# Patient Record
Sex: Female | Born: 1940
Health system: Southern US, Community
[De-identification: ages and names within clinical notes are randomized; demographics above are authoritative.]

## PROBLEM LIST (undated history)

## (undated) DIAGNOSIS — R112 Nausea with vomiting, unspecified: Secondary | ICD-10-CM

## (undated) DIAGNOSIS — T8859XA Other complications of anesthesia, initial encounter: Secondary | ICD-10-CM

## (undated) DIAGNOSIS — K589 Irritable bowel syndrome without diarrhea: Secondary | ICD-10-CM

## (undated) DIAGNOSIS — Z923 Personal history of irradiation: Secondary | ICD-10-CM

## (undated) DIAGNOSIS — I10 Essential (primary) hypertension: Secondary | ICD-10-CM

## (undated) DIAGNOSIS — C801 Malignant (primary) neoplasm, unspecified: Secondary | ICD-10-CM

## (undated) DIAGNOSIS — T4145XA Adverse effect of unspecified anesthetic, initial encounter: Secondary | ICD-10-CM

## (undated) DIAGNOSIS — Z889 Allergy status to unspecified drugs, medicaments and biological substances status: Secondary | ICD-10-CM

## (undated) DIAGNOSIS — Z803 Family history of malignant neoplasm of breast: Secondary | ICD-10-CM

## (undated) DIAGNOSIS — M1990 Unspecified osteoarthritis, unspecified site: Secondary | ICD-10-CM

## (undated) DIAGNOSIS — Z800 Family history of malignant neoplasm of digestive organs: Secondary | ICD-10-CM

## (undated) DIAGNOSIS — Z9889 Other specified postprocedural states: Secondary | ICD-10-CM

## (undated) DIAGNOSIS — C50919 Malignant neoplasm of unspecified site of unspecified female breast: Secondary | ICD-10-CM

## (undated) DIAGNOSIS — H409 Unspecified glaucoma: Secondary | ICD-10-CM

## (undated) HISTORY — DX: Family history of malignant neoplasm of breast: Z80.3

## (undated) HISTORY — DX: Family history of malignant neoplasm of digestive organs: Z80.0

## (undated) HISTORY — PX: BREAST BIOPSY: SHX20

---

## 2005-02-08 ENCOUNTER — Emergency Department (HOSPITAL_COMMUNITY): Admission: EM | Admit: 2005-02-08 | Discharge: 2005-02-09 | Payer: Self-pay | Admitting: Emergency Medicine

## 2005-03-21 ENCOUNTER — Ambulatory Visit (HOSPITAL_COMMUNITY): Admission: RE | Admit: 2005-03-21 | Discharge: 2005-03-21 | Payer: Self-pay | Admitting: Family Medicine

## 2005-07-18 ENCOUNTER — Other Ambulatory Visit: Admission: RE | Admit: 2005-07-18 | Discharge: 2005-07-18 | Payer: Self-pay | Admitting: Family Medicine

## 2006-04-25 ENCOUNTER — Ambulatory Visit (HOSPITAL_COMMUNITY): Admission: RE | Admit: 2006-04-25 | Discharge: 2006-04-25 | Payer: Self-pay | Admitting: Family Medicine

## 2007-04-28 ENCOUNTER — Ambulatory Visit (HOSPITAL_COMMUNITY): Admission: RE | Admit: 2007-04-28 | Discharge: 2007-04-28 | Payer: Self-pay | Admitting: Family Medicine

## 2008-06-22 ENCOUNTER — Ambulatory Visit (HOSPITAL_COMMUNITY): Admission: RE | Admit: 2008-06-22 | Discharge: 2008-06-22 | Payer: Self-pay | Admitting: Family Medicine

## 2008-10-03 ENCOUNTER — Ambulatory Visit (HOSPITAL_BASED_OUTPATIENT_CLINIC_OR_DEPARTMENT_OTHER): Admission: RE | Admit: 2008-10-03 | Discharge: 2008-10-03 | Payer: Self-pay | Admitting: Family Medicine

## 2008-10-03 ENCOUNTER — Ambulatory Visit: Payer: Self-pay | Admitting: Interventional Radiology

## 2008-10-14 ENCOUNTER — Encounter: Admission: RE | Admit: 2008-10-14 | Discharge: 2008-10-14 | Payer: Self-pay | Admitting: Family Medicine

## 2008-10-14 DIAGNOSIS — R509 Fever, unspecified: Secondary | ICD-10-CM | POA: Insufficient documentation

## 2008-10-14 DIAGNOSIS — R7989 Other specified abnormal findings of blood chemistry: Secondary | ICD-10-CM | POA: Insufficient documentation

## 2008-10-18 ENCOUNTER — Encounter: Payer: Self-pay | Admitting: Infectious Diseases

## 2008-10-19 ENCOUNTER — Encounter: Payer: Self-pay | Admitting: Infectious Diseases

## 2008-10-21 DIAGNOSIS — J309 Allergic rhinitis, unspecified: Secondary | ICD-10-CM | POA: Insufficient documentation

## 2008-10-21 DIAGNOSIS — K5730 Diverticulosis of large intestine without perforation or abscess without bleeding: Secondary | ICD-10-CM | POA: Insufficient documentation

## 2008-10-21 DIAGNOSIS — E669 Obesity, unspecified: Secondary | ICD-10-CM | POA: Insufficient documentation

## 2008-10-21 DIAGNOSIS — I10 Essential (primary) hypertension: Secondary | ICD-10-CM | POA: Insufficient documentation

## 2008-10-21 DIAGNOSIS — E785 Hyperlipidemia, unspecified: Secondary | ICD-10-CM | POA: Insufficient documentation

## 2008-10-27 ENCOUNTER — Ambulatory Visit: Payer: Self-pay | Admitting: Infectious Diseases

## 2008-10-28 ENCOUNTER — Encounter: Payer: Self-pay | Admitting: Infectious Diseases

## 2008-11-10 ENCOUNTER — Ambulatory Visit: Payer: Self-pay | Admitting: Infectious Diseases

## 2009-03-01 ENCOUNTER — Ambulatory Visit (HOSPITAL_BASED_OUTPATIENT_CLINIC_OR_DEPARTMENT_OTHER): Admission: RE | Admit: 2009-03-01 | Discharge: 2009-03-01 | Payer: Self-pay | Admitting: Family Medicine

## 2009-03-01 ENCOUNTER — Ambulatory Visit: Payer: Self-pay | Admitting: Diagnostic Radiology

## 2010-04-10 ENCOUNTER — Ambulatory Visit (HOSPITAL_COMMUNITY): Admission: RE | Admit: 2010-04-10 | Discharge: 2010-04-10 | Payer: Self-pay | Admitting: Family Medicine

## 2010-10-14 ENCOUNTER — Encounter: Payer: Self-pay | Admitting: Family Medicine

## 2011-05-03 ENCOUNTER — Other Ambulatory Visit (HOSPITAL_COMMUNITY): Payer: Self-pay | Admitting: Family Medicine

## 2011-05-03 DIAGNOSIS — Z1231 Encounter for screening mammogram for malignant neoplasm of breast: Secondary | ICD-10-CM

## 2011-05-14 ENCOUNTER — Ambulatory Visit (HOSPITAL_COMMUNITY)
Admission: RE | Admit: 2011-05-14 | Discharge: 2011-05-14 | Disposition: A | Discharge status: Home / Self Care | Payer: Medicare Other | Source: Ambulatory Visit | Attending: Family Medicine | Admitting: Family Medicine

## 2011-05-14 DIAGNOSIS — Z1231 Encounter for screening mammogram for malignant neoplasm of breast: Secondary | ICD-10-CM | POA: Insufficient documentation

## 2011-09-26 DIAGNOSIS — J309 Allergic rhinitis, unspecified: Secondary | ICD-10-CM | POA: Diagnosis not present

## 2011-10-03 DIAGNOSIS — J309 Allergic rhinitis, unspecified: Secondary | ICD-10-CM | POA: Diagnosis not present

## 2011-10-07 DIAGNOSIS — J309 Allergic rhinitis, unspecified: Secondary | ICD-10-CM | POA: Diagnosis not present

## 2011-10-10 DIAGNOSIS — J309 Allergic rhinitis, unspecified: Secondary | ICD-10-CM | POA: Diagnosis not present

## 2011-10-17 DIAGNOSIS — J309 Allergic rhinitis, unspecified: Secondary | ICD-10-CM | POA: Diagnosis not present

## 2011-10-24 DIAGNOSIS — J309 Allergic rhinitis, unspecified: Secondary | ICD-10-CM | POA: Diagnosis not present

## 2011-10-29 DIAGNOSIS — J309 Allergic rhinitis, unspecified: Secondary | ICD-10-CM | POA: Diagnosis not present

## 2011-11-04 DIAGNOSIS — J309 Allergic rhinitis, unspecified: Secondary | ICD-10-CM | POA: Diagnosis not present

## 2011-11-11 DIAGNOSIS — J309 Allergic rhinitis, unspecified: Secondary | ICD-10-CM | POA: Diagnosis not present

## 2011-11-18 DIAGNOSIS — J309 Allergic rhinitis, unspecified: Secondary | ICD-10-CM | POA: Diagnosis not present

## 2011-11-25 DIAGNOSIS — J309 Allergic rhinitis, unspecified: Secondary | ICD-10-CM | POA: Diagnosis not present

## 2011-11-29 DIAGNOSIS — E7800 Pure hypercholesterolemia, unspecified: Secondary | ICD-10-CM | POA: Diagnosis not present

## 2011-11-29 DIAGNOSIS — Z1331 Encounter for screening for depression: Secondary | ICD-10-CM | POA: Diagnosis not present

## 2011-11-29 DIAGNOSIS — I10 Essential (primary) hypertension: Secondary | ICD-10-CM | POA: Diagnosis not present

## 2011-12-02 DIAGNOSIS — J309 Allergic rhinitis, unspecified: Secondary | ICD-10-CM | POA: Diagnosis not present

## 2011-12-04 DIAGNOSIS — J309 Allergic rhinitis, unspecified: Secondary | ICD-10-CM | POA: Diagnosis not present

## 2011-12-09 DIAGNOSIS — J309 Allergic rhinitis, unspecified: Secondary | ICD-10-CM | POA: Diagnosis not present

## 2011-12-10 DIAGNOSIS — J309 Allergic rhinitis, unspecified: Secondary | ICD-10-CM | POA: Diagnosis not present

## 2011-12-11 DIAGNOSIS — H2510 Age-related nuclear cataract, unspecified eye: Secondary | ICD-10-CM | POA: Diagnosis not present

## 2011-12-11 DIAGNOSIS — H524 Presbyopia: Secondary | ICD-10-CM | POA: Diagnosis not present

## 2011-12-11 DIAGNOSIS — H5210 Myopia, unspecified eye: Secondary | ICD-10-CM | POA: Diagnosis not present

## 2011-12-16 DIAGNOSIS — J309 Allergic rhinitis, unspecified: Secondary | ICD-10-CM | POA: Diagnosis not present

## 2011-12-23 DIAGNOSIS — J309 Allergic rhinitis, unspecified: Secondary | ICD-10-CM | POA: Diagnosis not present

## 2011-12-30 DIAGNOSIS — J301 Allergic rhinitis due to pollen: Secondary | ICD-10-CM | POA: Diagnosis not present

## 2011-12-30 DIAGNOSIS — R21 Rash and other nonspecific skin eruption: Secondary | ICD-10-CM | POA: Diagnosis not present

## 2011-12-30 DIAGNOSIS — J309 Allergic rhinitis, unspecified: Secondary | ICD-10-CM | POA: Diagnosis not present

## 2011-12-30 DIAGNOSIS — H1045 Other chronic allergic conjunctivitis: Secondary | ICD-10-CM | POA: Diagnosis not present

## 2011-12-30 DIAGNOSIS — J3089 Other allergic rhinitis: Secondary | ICD-10-CM | POA: Diagnosis not present

## 2012-01-07 DIAGNOSIS — J309 Allergic rhinitis, unspecified: Secondary | ICD-10-CM | POA: Diagnosis not present

## 2012-01-13 DIAGNOSIS — J309 Allergic rhinitis, unspecified: Secondary | ICD-10-CM | POA: Diagnosis not present

## 2012-01-16 DIAGNOSIS — J309 Allergic rhinitis, unspecified: Secondary | ICD-10-CM | POA: Diagnosis not present

## 2012-01-21 DIAGNOSIS — J309 Allergic rhinitis, unspecified: Secondary | ICD-10-CM | POA: Diagnosis not present

## 2012-01-27 DIAGNOSIS — J309 Allergic rhinitis, unspecified: Secondary | ICD-10-CM | POA: Diagnosis not present

## 2012-02-03 DIAGNOSIS — J309 Allergic rhinitis, unspecified: Secondary | ICD-10-CM | POA: Diagnosis not present

## 2012-02-10 DIAGNOSIS — J309 Allergic rhinitis, unspecified: Secondary | ICD-10-CM | POA: Diagnosis not present

## 2012-02-18 DIAGNOSIS — J309 Allergic rhinitis, unspecified: Secondary | ICD-10-CM | POA: Diagnosis not present

## 2012-02-25 DIAGNOSIS — J309 Allergic rhinitis, unspecified: Secondary | ICD-10-CM | POA: Diagnosis not present

## 2012-03-04 DIAGNOSIS — R21 Rash and other nonspecific skin eruption: Secondary | ICD-10-CM | POA: Diagnosis not present

## 2012-03-04 DIAGNOSIS — J3089 Other allergic rhinitis: Secondary | ICD-10-CM | POA: Diagnosis not present

## 2012-03-04 DIAGNOSIS — H1045 Other chronic allergic conjunctivitis: Secondary | ICD-10-CM | POA: Diagnosis not present

## 2012-03-04 DIAGNOSIS — J301 Allergic rhinitis due to pollen: Secondary | ICD-10-CM | POA: Diagnosis not present

## 2012-03-25 DIAGNOSIS — J309 Allergic rhinitis, unspecified: Secondary | ICD-10-CM | POA: Diagnosis not present

## 2012-04-23 DIAGNOSIS — J309 Allergic rhinitis, unspecified: Secondary | ICD-10-CM | POA: Diagnosis not present

## 2012-04-30 DIAGNOSIS — J309 Allergic rhinitis, unspecified: Secondary | ICD-10-CM | POA: Diagnosis not present

## 2012-05-04 ENCOUNTER — Other Ambulatory Visit (HOSPITAL_COMMUNITY): Payer: Self-pay | Admitting: Family Medicine

## 2012-05-04 DIAGNOSIS — Z1231 Encounter for screening mammogram for malignant neoplasm of breast: Secondary | ICD-10-CM

## 2012-05-07 DIAGNOSIS — J309 Allergic rhinitis, unspecified: Secondary | ICD-10-CM | POA: Diagnosis not present

## 2012-05-15 DIAGNOSIS — J309 Allergic rhinitis, unspecified: Secondary | ICD-10-CM | POA: Diagnosis not present

## 2012-05-21 ENCOUNTER — Ambulatory Visit (HOSPITAL_COMMUNITY)
Admission: RE | Admit: 2012-05-21 | Discharge: 2012-05-21 | Disposition: A | Discharge status: Home / Self Care | Payer: Medicare Other | Source: Ambulatory Visit | Attending: Family Medicine | Admitting: Family Medicine

## 2012-05-21 DIAGNOSIS — Z1231 Encounter for screening mammogram for malignant neoplasm of breast: Secondary | ICD-10-CM | POA: Diagnosis not present

## 2012-05-22 DIAGNOSIS — J309 Allergic rhinitis, unspecified: Secondary | ICD-10-CM | POA: Diagnosis not present

## 2012-05-29 DIAGNOSIS — J309 Allergic rhinitis, unspecified: Secondary | ICD-10-CM | POA: Diagnosis not present

## 2012-06-02 DIAGNOSIS — Z23 Encounter for immunization: Secondary | ICD-10-CM | POA: Diagnosis not present

## 2012-06-09 DIAGNOSIS — J309 Allergic rhinitis, unspecified: Secondary | ICD-10-CM | POA: Diagnosis not present

## 2012-08-16 DIAGNOSIS — N390 Urinary tract infection, site not specified: Secondary | ICD-10-CM | POA: Diagnosis not present

## 2012-11-30 DIAGNOSIS — I10 Essential (primary) hypertension: Secondary | ICD-10-CM | POA: Diagnosis not present

## 2012-11-30 DIAGNOSIS — E7800 Pure hypercholesterolemia, unspecified: Secondary | ICD-10-CM | POA: Diagnosis not present

## 2012-11-30 DIAGNOSIS — J3089 Other allergic rhinitis: Secondary | ICD-10-CM | POA: Diagnosis not present

## 2012-12-22 ENCOUNTER — Other Ambulatory Visit (HOSPITAL_COMMUNITY)
Admission: RE | Admit: 2012-12-22 | Discharge: 2012-12-22 | Disposition: A | Discharge status: Home / Self Care | Payer: Medicare Other | Source: Ambulatory Visit | Attending: Obstetrics and Gynecology | Admitting: Obstetrics and Gynecology

## 2012-12-22 ENCOUNTER — Other Ambulatory Visit: Payer: Self-pay | Admitting: Obstetrics and Gynecology

## 2012-12-22 DIAGNOSIS — Z01419 Encounter for gynecological examination (general) (routine) without abnormal findings: Secondary | ICD-10-CM | POA: Insufficient documentation

## 2012-12-22 DIAGNOSIS — R1031 Right lower quadrant pain: Secondary | ICD-10-CM | POA: Diagnosis not present

## 2012-12-22 DIAGNOSIS — N951 Menopausal and female climacteric states: Secondary | ICD-10-CM | POA: Diagnosis not present

## 2012-12-22 DIAGNOSIS — Z1151 Encounter for screening for human papillomavirus (HPV): Secondary | ICD-10-CM | POA: Diagnosis not present

## 2013-01-28 DIAGNOSIS — R1031 Right lower quadrant pain: Secondary | ICD-10-CM | POA: Diagnosis not present

## 2013-01-28 DIAGNOSIS — N951 Menopausal and female climacteric states: Secondary | ICD-10-CM | POA: Diagnosis not present

## 2013-02-12 DIAGNOSIS — H5210 Myopia, unspecified eye: Secondary | ICD-10-CM | POA: Diagnosis not present

## 2013-02-12 DIAGNOSIS — H11829 Conjunctivochalasis, unspecified eye: Secondary | ICD-10-CM | POA: Diagnosis not present

## 2013-02-12 DIAGNOSIS — H40009 Preglaucoma, unspecified, unspecified eye: Secondary | ICD-10-CM | POA: Diagnosis not present

## 2013-02-12 DIAGNOSIS — H02839 Dermatochalasis of unspecified eye, unspecified eyelid: Secondary | ICD-10-CM | POA: Insufficient documentation

## 2013-02-12 DIAGNOSIS — H40003 Preglaucoma, unspecified, bilateral: Secondary | ICD-10-CM | POA: Insufficient documentation

## 2013-02-12 DIAGNOSIS — H2589 Other age-related cataract: Secondary | ICD-10-CM | POA: Diagnosis not present

## 2013-02-16 DIAGNOSIS — R9389 Abnormal findings on diagnostic imaging of other specified body structures: Secondary | ICD-10-CM | POA: Diagnosis not present

## 2013-02-16 DIAGNOSIS — R1031 Right lower quadrant pain: Secondary | ICD-10-CM | POA: Diagnosis not present

## 2013-05-20 DIAGNOSIS — R9389 Abnormal findings on diagnostic imaging of other specified body structures: Secondary | ICD-10-CM | POA: Diagnosis not present

## 2013-05-28 ENCOUNTER — Other Ambulatory Visit (HOSPITAL_COMMUNITY): Payer: Self-pay | Admitting: Family Medicine

## 2013-05-28 DIAGNOSIS — Z1231 Encounter for screening mammogram for malignant neoplasm of breast: Secondary | ICD-10-CM

## 2013-05-31 DIAGNOSIS — M659 Synovitis and tenosynovitis, unspecified: Secondary | ICD-10-CM | POA: Diagnosis not present

## 2013-05-31 DIAGNOSIS — R7309 Other abnormal glucose: Secondary | ICD-10-CM | POA: Diagnosis not present

## 2013-05-31 DIAGNOSIS — I10 Essential (primary) hypertension: Secondary | ICD-10-CM | POA: Diagnosis not present

## 2013-06-04 ENCOUNTER — Ambulatory Visit (HOSPITAL_COMMUNITY)
Admission: RE | Admit: 2013-06-04 | Discharge: 2013-06-04 | Disposition: A | Discharge status: Home / Self Care | Payer: Medicare Other | Source: Ambulatory Visit | Attending: Family Medicine | Admitting: Family Medicine

## 2013-06-04 DIAGNOSIS — Z1231 Encounter for screening mammogram for malignant neoplasm of breast: Secondary | ICD-10-CM | POA: Insufficient documentation

## 2013-06-07 DIAGNOSIS — Z23 Encounter for immunization: Secondary | ICD-10-CM | POA: Diagnosis not present

## 2013-11-29 DIAGNOSIS — I10 Essential (primary) hypertension: Secondary | ICD-10-CM | POA: Diagnosis not present

## 2013-11-29 DIAGNOSIS — E7800 Pure hypercholesterolemia, unspecified: Secondary | ICD-10-CM | POA: Diagnosis not present

## 2013-11-29 DIAGNOSIS — B372 Candidiasis of skin and nail: Secondary | ICD-10-CM | POA: Diagnosis not present

## 2014-01-03 DIAGNOSIS — Z803 Family history of malignant neoplasm of breast: Secondary | ICD-10-CM | POA: Diagnosis not present

## 2014-01-03 DIAGNOSIS — Z01419 Encounter for gynecological examination (general) (routine) without abnormal findings: Secondary | ICD-10-CM | POA: Diagnosis not present

## 2014-01-04 ENCOUNTER — Telehealth: Payer: Self-pay | Admitting: Genetic Counselor

## 2014-01-04 NOTE — Telephone Encounter (Signed)
S/W PATIENT AND GAVE GENETIC APPT FOR 05/13 @ 1:30 REFERRING DR. Estill Bamberg VARNDO DX- FAMILY HX OF BREAST CA

## 2014-02-02 ENCOUNTER — Other Ambulatory Visit: Payer: Medicare Other

## 2014-02-02 ENCOUNTER — Encounter: Payer: Self-pay | Admitting: Genetic Counselor

## 2014-02-02 ENCOUNTER — Ambulatory Visit (HOSPITAL_BASED_OUTPATIENT_CLINIC_OR_DEPARTMENT_OTHER): Payer: Medicare Other | Admitting: Genetic Counselor

## 2014-02-02 DIAGNOSIS — Z807 Family history of other malignant neoplasms of lymphoid, hematopoietic and related tissues: Secondary | ICD-10-CM

## 2014-02-02 DIAGNOSIS — Z803 Family history of malignant neoplasm of breast: Secondary | ICD-10-CM | POA: Diagnosis not present

## 2014-02-02 DIAGNOSIS — Z800 Family history of malignant neoplasm of digestive organs: Secondary | ICD-10-CM | POA: Diagnosis not present

## 2014-02-02 DIAGNOSIS — Z809 Family history of malignant neoplasm, unspecified: Secondary | ICD-10-CM | POA: Diagnosis not present

## 2014-02-02 DIAGNOSIS — Z801 Family history of malignant neoplasm of trachea, bronchus and lung: Secondary | ICD-10-CM | POA: Diagnosis not present

## 2014-02-02 NOTE — Progress Notes (Signed)
Patient Name: Cheyenne Lee Patient Age: 73 y.o. Encounter Date: 02/02/2014  Referring Physician: Campbell Riches, MD [address]  [place], [place] [address]  Primary Care Provider: Bobby Rumpf, MD   [name], a 73 y.o. female, is being seen at the [facility] due to a family history of breast cancer.  She presents to clinic today to discuss the possibility of a hereditary predisposition to cancer and discuss whether genetic testing is warranted.  HISTORY OF PRESENT ILLNESS: Cheyenne Lee has no personal history of cancer. She reports having fibrocystic breasts and previous removal of benign fibroadenomas. She reports having a yearly mammogram, clinical breast exam and gynecologic exam. Her last colonoscopy was in 2008 and she states 2-3 polyps were removed. A prior colonoscopy in 1998 revealed 2 polyps. She states she is scheduled for a colonoscopy this June.  Past Medical History  Diagnosis Date  . Family history of malignant neoplasm of breast    History   Social History  . Marital Status: Married    Spouse Name: N/A    Number of Children: N/A  . Years of Education: N/A   Social History Main Topics  . Smoking status: Not on file  . Smokeless tobacco: Not on file  . Alcohol Use: Not on file  . Drug Use: Not on file  . Sexual Activity: Not on file   Other Topics Concern  . Not on file   Social History Narrative  . No narrative on file     FAMILY HISTORY:   During the visit, a 4-generation pedigree was obtained. Significant diagnoses include the following:  Family History  Problem Relation Age of Onset  . Cancer Father 47    unk. primary; deceased 44  . Breast cancer Sister 36    currently 74; history of NHL  . Lung cancer Maternal Aunt     deceased 72; smoker  . Lung cancer Maternal Uncle     2 uncles; smokers; deceased  . Breast cancer Paternal Aunt 11    deceased 59s  . Cancer Paternal Uncle      unk. primary; deceased late 38s  . Breast cancer Paternal Grandmother     deceased 48  . Lung cancer Paternal Grandfather     deceased 9; smoker  . Breast cancer Paternal Aunt 24    deceased 72s  . Breast cancer Cousin     pat cousin; daughter of an aunt w/ BC; dx 57s; deceased 8  . Cancer Maternal Aunt     unk. primary; deceased 75s  . Cancer Maternal Uncle     GI cancer; deceased 62s  . Cancer Other 35    lymphoma in niece; daughter of her brother; deceased    All breast cancers reported above occurred post menopause. Her mother died cancer-free at age 66. Cheyenne Lee reports many cancers, but age at diagnosis and site of primary is unknown to her for several relatives.  Cheyenne Lee ancestry is Caucasian - NOS. There is no known Jewish ancestry and no consanguinity.  ASSESSMENT AND PLAN: Cheyenne Lee is a 73 y.o. female with a family history of breast cancer in her sister, 2 paternal aunts, paternal grandmother and a paternal cousin. Even though there are several relatives with breast cancer, this history is not suggestive of a hereditary predisposition to cancer given the ages at diagnosis. We reviewed the characteristics, features and inheritance patterns of hereditary cancer syndromes to explain why her  own risk of having a hereditary risk is low.  Cheyenne Lee stated that her sister with breast cancer is scheduled to have genetic testing. If a pathogenic mutation is identified in her sister, we would recommend testing Cheyenne Lee for that mutation. Otherwise, genetic testing is not indicated at this time.  Genetic testing was not recommend at this time. We recommended Cheyenne Lee continue to have a yearly mammogram, a yearly clinical breast exam, perform monthly breast self-exams and have a yearly gynecologic exam. She is scheduled for a colonoscopy in June of this year.  We encouraged Cheyenne Lee to remain in contact with [facility] annually so that we can update  the family history and inform her of any changes in cancer genetics and testing that may be of benefit for this family. She agreed to contact us with results of her sister's genetic testing. [name] questions were answered to her satisfaction today.   Thank you for the referral and allowing [place] to share in the care of your patient.   The patient was seen for a total of 30 minutes, greater than 50% of which was spent face-to-face counseling. This patient was discussed with the referring provider who agrees with the above.

## 2014-02-08 DIAGNOSIS — M79609 Pain in unspecified limb: Secondary | ICD-10-CM | POA: Diagnosis not present

## 2014-02-24 ENCOUNTER — Ambulatory Visit: Payer: Self-pay | Admitting: Podiatry

## 2014-03-09 ENCOUNTER — Other Ambulatory Visit: Payer: Self-pay | Admitting: Gastroenterology

## 2014-03-09 DIAGNOSIS — Z86010 Personal history of colonic polyps: Secondary | ICD-10-CM | POA: Diagnosis not present

## 2014-03-09 DIAGNOSIS — Z09 Encounter for follow-up examination after completed treatment for conditions other than malignant neoplasm: Secondary | ICD-10-CM | POA: Diagnosis not present

## 2014-03-09 DIAGNOSIS — K5730 Diverticulosis of large intestine without perforation or abscess without bleeding: Secondary | ICD-10-CM | POA: Diagnosis not present

## 2014-03-09 DIAGNOSIS — D126 Benign neoplasm of colon, unspecified: Secondary | ICD-10-CM | POA: Diagnosis not present

## 2014-05-03 DIAGNOSIS — M79609 Pain in unspecified limb: Secondary | ICD-10-CM | POA: Diagnosis not present

## 2014-06-02 ENCOUNTER — Other Ambulatory Visit (HOSPITAL_COMMUNITY): Payer: Self-pay | Admitting: Family Medicine

## 2014-06-02 DIAGNOSIS — Z23 Encounter for immunization: Secondary | ICD-10-CM | POA: Diagnosis not present

## 2014-06-02 DIAGNOSIS — Z1231 Encounter for screening mammogram for malignant neoplasm of breast: Secondary | ICD-10-CM

## 2014-06-02 DIAGNOSIS — I10 Essential (primary) hypertension: Secondary | ICD-10-CM | POA: Diagnosis not present

## 2014-06-02 DIAGNOSIS — Z0000 Encounter for general adult medical examination without abnormal findings: Secondary | ICD-10-CM | POA: Diagnosis not present

## 2014-06-07 ENCOUNTER — Ambulatory Visit (HOSPITAL_COMMUNITY)
Admission: RE | Admit: 2014-06-07 | Discharge: 2014-06-07 | Disposition: A | Discharge status: Home / Self Care | Payer: Medicare Other | Source: Ambulatory Visit | Attending: Family Medicine | Admitting: Family Medicine

## 2014-06-07 DIAGNOSIS — Z1231 Encounter for screening mammogram for malignant neoplasm of breast: Secondary | ICD-10-CM | POA: Insufficient documentation

## 2014-06-10 DIAGNOSIS — Z23 Encounter for immunization: Secondary | ICD-10-CM | POA: Diagnosis not present

## 2014-07-19 DIAGNOSIS — B351 Tinea unguium: Secondary | ICD-10-CM | POA: Diagnosis not present

## 2014-08-02 DIAGNOSIS — J069 Acute upper respiratory infection, unspecified: Secondary | ICD-10-CM | POA: Diagnosis not present

## 2014-08-02 DIAGNOSIS — M25512 Pain in left shoulder: Secondary | ICD-10-CM | POA: Diagnosis not present

## 2014-08-02 DIAGNOSIS — I10 Essential (primary) hypertension: Secondary | ICD-10-CM | POA: Diagnosis not present

## 2014-08-02 DIAGNOSIS — L509 Urticaria, unspecified: Secondary | ICD-10-CM | POA: Diagnosis not present

## 2014-08-06 DIAGNOSIS — L309 Dermatitis, unspecified: Secondary | ICD-10-CM | POA: Diagnosis not present

## 2014-12-01 DIAGNOSIS — I10 Essential (primary) hypertension: Secondary | ICD-10-CM | POA: Diagnosis not present

## 2014-12-01 DIAGNOSIS — M791 Myalgia: Secondary | ICD-10-CM | POA: Diagnosis not present

## 2014-12-01 DIAGNOSIS — J302 Other seasonal allergic rhinitis: Secondary | ICD-10-CM | POA: Diagnosis not present

## 2014-12-01 DIAGNOSIS — E669 Obesity, unspecified: Secondary | ICD-10-CM | POA: Diagnosis not present

## 2014-12-01 DIAGNOSIS — E780 Pure hypercholesterolemia: Secondary | ICD-10-CM | POA: Diagnosis not present

## 2014-12-14 DIAGNOSIS — M25512 Pain in left shoulder: Secondary | ICD-10-CM | POA: Diagnosis not present

## 2014-12-17 DIAGNOSIS — M25512 Pain in left shoulder: Secondary | ICD-10-CM | POA: Diagnosis not present

## 2014-12-20 DIAGNOSIS — M25512 Pain in left shoulder: Secondary | ICD-10-CM | POA: Diagnosis not present

## 2014-12-22 DIAGNOSIS — M25512 Pain in left shoulder: Secondary | ICD-10-CM | POA: Diagnosis not present

## 2014-12-26 DIAGNOSIS — M25512 Pain in left shoulder: Secondary | ICD-10-CM | POA: Diagnosis not present

## 2014-12-28 DIAGNOSIS — M25512 Pain in left shoulder: Secondary | ICD-10-CM | POA: Diagnosis not present

## 2015-01-02 DIAGNOSIS — M25512 Pain in left shoulder: Secondary | ICD-10-CM | POA: Diagnosis not present

## 2015-01-05 DIAGNOSIS — H43393 Other vitreous opacities, bilateral: Secondary | ICD-10-CM | POA: Insufficient documentation

## 2015-01-06 DIAGNOSIS — H43393 Other vitreous opacities, bilateral: Secondary | ICD-10-CM | POA: Diagnosis not present

## 2015-01-06 DIAGNOSIS — H25813 Combined forms of age-related cataract, bilateral: Secondary | ICD-10-CM | POA: Diagnosis not present

## 2015-01-06 DIAGNOSIS — H40003 Preglaucoma, unspecified, bilateral: Secondary | ICD-10-CM | POA: Diagnosis not present

## 2015-01-06 DIAGNOSIS — H52203 Unspecified astigmatism, bilateral: Secondary | ICD-10-CM | POA: Diagnosis not present

## 2015-01-06 DIAGNOSIS — H5213 Myopia, bilateral: Secondary | ICD-10-CM | POA: Diagnosis not present

## 2015-01-06 DIAGNOSIS — H11823 Conjunctivochalasis, bilateral: Secondary | ICD-10-CM | POA: Diagnosis not present

## 2015-01-31 DIAGNOSIS — Z87891 Personal history of nicotine dependence: Secondary | ICD-10-CM | POA: Diagnosis not present

## 2015-01-31 DIAGNOSIS — H25812 Combined forms of age-related cataract, left eye: Secondary | ICD-10-CM | POA: Diagnosis not present

## 2015-01-31 DIAGNOSIS — Z885 Allergy status to narcotic agent status: Secondary | ICD-10-CM | POA: Diagnosis not present

## 2015-01-31 DIAGNOSIS — Z7982 Long term (current) use of aspirin: Secondary | ICD-10-CM | POA: Diagnosis not present

## 2015-01-31 DIAGNOSIS — I10 Essential (primary) hypertension: Secondary | ICD-10-CM | POA: Diagnosis not present

## 2015-02-06 DIAGNOSIS — H52209 Unspecified astigmatism, unspecified eye: Secondary | ICD-10-CM | POA: Diagnosis not present

## 2015-02-06 DIAGNOSIS — H40003 Preglaucoma, unspecified, bilateral: Secondary | ICD-10-CM | POA: Diagnosis not present

## 2015-02-06 DIAGNOSIS — I10 Essential (primary) hypertension: Secondary | ICD-10-CM | POA: Diagnosis not present

## 2015-02-06 DIAGNOSIS — Z885 Allergy status to narcotic agent status: Secondary | ICD-10-CM | POA: Diagnosis not present

## 2015-02-06 DIAGNOSIS — H25812 Combined forms of age-related cataract, left eye: Secondary | ICD-10-CM | POA: Diagnosis not present

## 2015-02-06 DIAGNOSIS — Z7951 Long term (current) use of inhaled steroids: Secondary | ICD-10-CM | POA: Diagnosis not present

## 2015-02-06 DIAGNOSIS — K219 Gastro-esophageal reflux disease without esophagitis: Secondary | ICD-10-CM | POA: Diagnosis not present

## 2015-02-06 DIAGNOSIS — M1990 Unspecified osteoarthritis, unspecified site: Secondary | ICD-10-CM | POA: Diagnosis not present

## 2015-02-06 DIAGNOSIS — Z7982 Long term (current) use of aspirin: Secondary | ICD-10-CM | POA: Diagnosis not present

## 2015-02-06 DIAGNOSIS — Z7902 Long term (current) use of antithrombotics/antiplatelets: Secondary | ICD-10-CM | POA: Diagnosis not present

## 2015-02-06 DIAGNOSIS — H11829 Conjunctivochalasis, unspecified eye: Secondary | ICD-10-CM | POA: Diagnosis not present

## 2015-02-07 DIAGNOSIS — Z48810 Encounter for surgical aftercare following surgery on the sense organs: Secondary | ICD-10-CM | POA: Diagnosis not present

## 2015-02-07 DIAGNOSIS — Z9842 Cataract extraction status, left eye: Secondary | ICD-10-CM | POA: Diagnosis not present

## 2015-02-07 DIAGNOSIS — Z961 Presence of intraocular lens: Secondary | ICD-10-CM | POA: Diagnosis not present

## 2015-02-14 DIAGNOSIS — Z48810 Encounter for surgical aftercare following surgery on the sense organs: Secondary | ICD-10-CM | POA: Diagnosis not present

## 2015-02-14 DIAGNOSIS — H25811 Combined forms of age-related cataract, right eye: Secondary | ICD-10-CM | POA: Diagnosis not present

## 2015-02-14 DIAGNOSIS — Z961 Presence of intraocular lens: Secondary | ICD-10-CM | POA: Diagnosis not present

## 2015-02-14 DIAGNOSIS — M329 Systemic lupus erythematosus, unspecified: Secondary | ICD-10-CM | POA: Diagnosis not present

## 2015-03-15 DIAGNOSIS — Z885 Allergy status to narcotic agent status: Secondary | ICD-10-CM | POA: Diagnosis not present

## 2015-03-15 DIAGNOSIS — Z91048 Other nonmedicinal substance allergy status: Secondary | ICD-10-CM | POA: Diagnosis not present

## 2015-03-15 DIAGNOSIS — Z87891 Personal history of nicotine dependence: Secondary | ICD-10-CM | POA: Diagnosis not present

## 2015-03-15 DIAGNOSIS — Z7982 Long term (current) use of aspirin: Secondary | ICD-10-CM | POA: Diagnosis not present

## 2015-03-15 DIAGNOSIS — Z961 Presence of intraocular lens: Secondary | ICD-10-CM | POA: Diagnosis not present

## 2015-03-15 DIAGNOSIS — H25811 Combined forms of age-related cataract, right eye: Secondary | ICD-10-CM | POA: Diagnosis not present

## 2015-03-15 DIAGNOSIS — I10 Essential (primary) hypertension: Secondary | ICD-10-CM | POA: Diagnosis not present

## 2015-03-20 DIAGNOSIS — Z7982 Long term (current) use of aspirin: Secondary | ICD-10-CM | POA: Diagnosis not present

## 2015-03-20 DIAGNOSIS — H25811 Combined forms of age-related cataract, right eye: Secondary | ICD-10-CM | POA: Diagnosis not present

## 2015-03-20 DIAGNOSIS — I10 Essential (primary) hypertension: Secondary | ICD-10-CM | POA: Diagnosis not present

## 2015-03-20 DIAGNOSIS — K219 Gastro-esophageal reflux disease without esophagitis: Secondary | ICD-10-CM | POA: Diagnosis not present

## 2015-03-20 DIAGNOSIS — Z86010 Personal history of colonic polyps: Secondary | ICD-10-CM | POA: Diagnosis not present

## 2015-03-21 DIAGNOSIS — Z48810 Encounter for surgical aftercare following surgery on the sense organs: Secondary | ICD-10-CM | POA: Diagnosis not present

## 2015-03-21 DIAGNOSIS — Z961 Presence of intraocular lens: Secondary | ICD-10-CM | POA: Diagnosis not present

## 2015-04-04 DIAGNOSIS — Z961 Presence of intraocular lens: Secondary | ICD-10-CM | POA: Diagnosis not present

## 2015-04-04 DIAGNOSIS — Z48810 Encounter for surgical aftercare following surgery on the sense organs: Secondary | ICD-10-CM | POA: Diagnosis not present

## 2015-04-04 DIAGNOSIS — M329 Systemic lupus erythematosus, unspecified: Secondary | ICD-10-CM | POA: Diagnosis not present

## 2015-04-12 DIAGNOSIS — L821 Other seborrheic keratosis: Secondary | ICD-10-CM | POA: Diagnosis not present

## 2015-04-12 DIAGNOSIS — D239 Other benign neoplasm of skin, unspecified: Secondary | ICD-10-CM | POA: Diagnosis not present

## 2015-04-18 DIAGNOSIS — Z961 Presence of intraocular lens: Secondary | ICD-10-CM | POA: Diagnosis not present

## 2015-04-18 DIAGNOSIS — Z48810 Encounter for surgical aftercare following surgery on the sense organs: Secondary | ICD-10-CM | POA: Diagnosis not present

## 2015-06-05 ENCOUNTER — Other Ambulatory Visit (HOSPITAL_COMMUNITY): Payer: Self-pay | Admitting: Family Medicine

## 2015-06-05 DIAGNOSIS — Z0000 Encounter for general adult medical examination without abnormal findings: Secondary | ICD-10-CM | POA: Diagnosis not present

## 2015-06-05 DIAGNOSIS — Z1231 Encounter for screening mammogram for malignant neoplasm of breast: Secondary | ICD-10-CM

## 2015-06-05 DIAGNOSIS — Z23 Encounter for immunization: Secondary | ICD-10-CM | POA: Diagnosis not present

## 2015-06-12 ENCOUNTER — Ambulatory Visit (HOSPITAL_COMMUNITY)
Admission: RE | Admit: 2015-06-12 | Discharge: 2015-06-12 | Disposition: A | Discharge status: Home / Self Care | Payer: Medicare Other | Source: Ambulatory Visit | Attending: Family Medicine | Admitting: Family Medicine

## 2015-06-12 ENCOUNTER — Other Ambulatory Visit (HOSPITAL_COMMUNITY): Payer: Self-pay | Admitting: Family Medicine

## 2015-06-12 DIAGNOSIS — Z1231 Encounter for screening mammogram for malignant neoplasm of breast: Secondary | ICD-10-CM

## 2015-12-13 DIAGNOSIS — I10 Essential (primary) hypertension: Secondary | ICD-10-CM | POA: Diagnosis not present

## 2015-12-13 DIAGNOSIS — J302 Other seasonal allergic rhinitis: Secondary | ICD-10-CM | POA: Diagnosis not present

## 2015-12-13 DIAGNOSIS — E7800 Pure hypercholesterolemia, unspecified: Secondary | ICD-10-CM | POA: Diagnosis not present

## 2015-12-18 DIAGNOSIS — Z961 Presence of intraocular lens: Secondary | ICD-10-CM | POA: Insufficient documentation

## 2015-12-19 DIAGNOSIS — H11823 Conjunctivochalasis, bilateral: Secondary | ICD-10-CM | POA: Diagnosis not present

## 2015-12-19 DIAGNOSIS — Z79899 Other long term (current) drug therapy: Secondary | ICD-10-CM | POA: Diagnosis not present

## 2015-12-19 DIAGNOSIS — H02835 Dermatochalasis of left lower eyelid: Secondary | ICD-10-CM | POA: Diagnosis not present

## 2015-12-19 DIAGNOSIS — H26493 Other secondary cataract, bilateral: Secondary | ICD-10-CM | POA: Diagnosis not present

## 2015-12-19 DIAGNOSIS — H02834 Dermatochalasis of left upper eyelid: Secondary | ICD-10-CM | POA: Diagnosis not present

## 2015-12-19 DIAGNOSIS — H5213 Myopia, bilateral: Secondary | ICD-10-CM | POA: Diagnosis not present

## 2015-12-19 DIAGNOSIS — H43393 Other vitreous opacities, bilateral: Secondary | ICD-10-CM | POA: Diagnosis not present

## 2015-12-19 DIAGNOSIS — H02832 Dermatochalasis of right lower eyelid: Secondary | ICD-10-CM | POA: Diagnosis not present

## 2015-12-19 DIAGNOSIS — Z87891 Personal history of nicotine dependence: Secondary | ICD-10-CM | POA: Diagnosis not present

## 2015-12-19 DIAGNOSIS — H524 Presbyopia: Secondary | ICD-10-CM | POA: Diagnosis not present

## 2015-12-19 DIAGNOSIS — Z9842 Cataract extraction status, left eye: Secondary | ICD-10-CM | POA: Diagnosis not present

## 2015-12-19 DIAGNOSIS — Z9841 Cataract extraction status, right eye: Secondary | ICD-10-CM | POA: Diagnosis not present

## 2015-12-19 DIAGNOSIS — Z961 Presence of intraocular lens: Secondary | ICD-10-CM | POA: Diagnosis not present

## 2015-12-19 DIAGNOSIS — I10 Essential (primary) hypertension: Secondary | ICD-10-CM | POA: Diagnosis not present

## 2015-12-19 DIAGNOSIS — H52203 Unspecified astigmatism, bilateral: Secondary | ICD-10-CM | POA: Diagnosis not present

## 2015-12-19 DIAGNOSIS — Z885 Allergy status to narcotic agent status: Secondary | ICD-10-CM | POA: Diagnosis not present

## 2015-12-19 DIAGNOSIS — H02839 Dermatochalasis of unspecified eye, unspecified eyelid: Secondary | ICD-10-CM | POA: Diagnosis not present

## 2015-12-19 DIAGNOSIS — Z7982 Long term (current) use of aspirin: Secondary | ICD-10-CM | POA: Diagnosis not present

## 2015-12-19 DIAGNOSIS — H40003 Preglaucoma, unspecified, bilateral: Secondary | ICD-10-CM | POA: Diagnosis not present

## 2015-12-19 DIAGNOSIS — H02831 Dermatochalasis of right upper eyelid: Secondary | ICD-10-CM | POA: Diagnosis not present

## 2015-12-19 DIAGNOSIS — H02403 Unspecified ptosis of bilateral eyelids: Secondary | ICD-10-CM | POA: Diagnosis not present

## 2016-02-20 DIAGNOSIS — R05 Cough: Secondary | ICD-10-CM | POA: Diagnosis not present

## 2016-05-02 ENCOUNTER — Other Ambulatory Visit: Payer: Self-pay | Admitting: Obstetrics and Gynecology

## 2016-05-02 DIAGNOSIS — Z1231 Encounter for screening mammogram for malignant neoplasm of breast: Secondary | ICD-10-CM

## 2016-06-05 DIAGNOSIS — I10 Essential (primary) hypertension: Secondary | ICD-10-CM | POA: Diagnosis not present

## 2016-06-05 DIAGNOSIS — E7800 Pure hypercholesterolemia, unspecified: Secondary | ICD-10-CM | POA: Diagnosis not present

## 2016-06-05 DIAGNOSIS — K589 Irritable bowel syndrome without diarrhea: Secondary | ICD-10-CM | POA: Diagnosis not present

## 2016-06-05 DIAGNOSIS — Z23 Encounter for immunization: Secondary | ICD-10-CM | POA: Diagnosis not present

## 2016-06-05 DIAGNOSIS — E669 Obesity, unspecified: Secondary | ICD-10-CM | POA: Diagnosis not present

## 2016-06-05 DIAGNOSIS — Z0000 Encounter for general adult medical examination without abnormal findings: Secondary | ICD-10-CM | POA: Diagnosis not present

## 2016-06-12 ENCOUNTER — Ambulatory Visit
Admission: RE | Admit: 2016-06-12 | Discharge: 2016-06-12 | Disposition: A | Discharge status: Home / Self Care | Payer: Medicare Other | Source: Ambulatory Visit | Attending: Obstetrics and Gynecology | Admitting: Obstetrics and Gynecology

## 2016-06-12 DIAGNOSIS — Z1231 Encounter for screening mammogram for malignant neoplasm of breast: Secondary | ICD-10-CM

## 2016-09-23 HISTORY — PX: BREAST LUMPECTOMY: SHX2

## 2016-11-27 DIAGNOSIS — L570 Actinic keratosis: Secondary | ICD-10-CM | POA: Diagnosis not present

## 2016-11-27 DIAGNOSIS — B029 Zoster without complications: Secondary | ICD-10-CM | POA: Diagnosis not present

## 2016-11-27 DIAGNOSIS — L821 Other seborrheic keratosis: Secondary | ICD-10-CM | POA: Diagnosis not present

## 2016-11-27 DIAGNOSIS — D229 Melanocytic nevi, unspecified: Secondary | ICD-10-CM | POA: Diagnosis not present

## 2016-12-04 DIAGNOSIS — Z87891 Personal history of nicotine dependence: Secondary | ICD-10-CM | POA: Diagnosis not present

## 2016-12-04 DIAGNOSIS — K589 Irritable bowel syndrome without diarrhea: Secondary | ICD-10-CM | POA: Diagnosis not present

## 2016-12-04 DIAGNOSIS — I10 Essential (primary) hypertension: Secondary | ICD-10-CM | POA: Diagnosis not present

## 2016-12-04 DIAGNOSIS — R7309 Other abnormal glucose: Secondary | ICD-10-CM | POA: Diagnosis not present

## 2016-12-04 DIAGNOSIS — M1990 Unspecified osteoarthritis, unspecified site: Secondary | ICD-10-CM | POA: Diagnosis not present

## 2016-12-04 DIAGNOSIS — E7800 Pure hypercholesterolemia, unspecified: Secondary | ICD-10-CM | POA: Diagnosis not present

## 2016-12-09 ENCOUNTER — Other Ambulatory Visit: Payer: Self-pay | Admitting: Family Medicine

## 2016-12-09 DIAGNOSIS — Z136 Encounter for screening for cardiovascular disorders: Secondary | ICD-10-CM

## 2016-12-13 ENCOUNTER — Ambulatory Visit
Admission: RE | Admit: 2016-12-13 | Discharge: 2016-12-13 | Disposition: A | Discharge status: Home / Self Care | Payer: Medicare Other | Source: Ambulatory Visit | Attending: Family Medicine | Admitting: Family Medicine

## 2016-12-13 ENCOUNTER — Other Ambulatory Visit: Payer: Self-pay | Admitting: Family Medicine

## 2016-12-13 DIAGNOSIS — Z87891 Personal history of nicotine dependence: Secondary | ICD-10-CM | POA: Diagnosis not present

## 2016-12-13 DIAGNOSIS — Z136 Encounter for screening for cardiovascular disorders: Secondary | ICD-10-CM | POA: Diagnosis not present

## 2016-12-26 DIAGNOSIS — H40003 Preglaucoma, unspecified, bilateral: Secondary | ICD-10-CM | POA: Diagnosis not present

## 2016-12-26 DIAGNOSIS — H02839 Dermatochalasis of unspecified eye, unspecified eyelid: Secondary | ICD-10-CM | POA: Diagnosis not present

## 2016-12-26 DIAGNOSIS — H52203 Unspecified astigmatism, bilateral: Secondary | ICD-10-CM | POA: Diagnosis not present

## 2016-12-26 DIAGNOSIS — H43813 Vitreous degeneration, bilateral: Secondary | ICD-10-CM | POA: Diagnosis not present

## 2016-12-26 DIAGNOSIS — H5213 Myopia, bilateral: Secondary | ICD-10-CM | POA: Insufficient documentation

## 2016-12-26 DIAGNOSIS — H524 Presbyopia: Secondary | ICD-10-CM | POA: Diagnosis not present

## 2016-12-26 DIAGNOSIS — Z961 Presence of intraocular lens: Secondary | ICD-10-CM | POA: Diagnosis not present

## 2017-01-22 DIAGNOSIS — M25552 Pain in left hip: Secondary | ICD-10-CM | POA: Diagnosis not present

## 2017-01-22 DIAGNOSIS — M7062 Trochanteric bursitis, left hip: Secondary | ICD-10-CM | POA: Diagnosis not present

## 2017-01-22 DIAGNOSIS — M1990 Unspecified osteoarthritis, unspecified site: Secondary | ICD-10-CM | POA: Diagnosis not present

## 2017-02-18 DIAGNOSIS — M7062 Trochanteric bursitis, left hip: Secondary | ICD-10-CM | POA: Diagnosis not present

## 2017-02-20 DIAGNOSIS — M7062 Trochanteric bursitis, left hip: Secondary | ICD-10-CM | POA: Diagnosis not present

## 2017-02-24 DIAGNOSIS — M7062 Trochanteric bursitis, left hip: Secondary | ICD-10-CM | POA: Diagnosis not present

## 2017-02-26 DIAGNOSIS — M7062 Trochanteric bursitis, left hip: Secondary | ICD-10-CM | POA: Diagnosis not present

## 2017-03-03 DIAGNOSIS — M7062 Trochanteric bursitis, left hip: Secondary | ICD-10-CM | POA: Diagnosis not present

## 2017-03-05 DIAGNOSIS — M7062 Trochanteric bursitis, left hip: Secondary | ICD-10-CM | POA: Diagnosis not present

## 2017-03-10 DIAGNOSIS — M7062 Trochanteric bursitis, left hip: Secondary | ICD-10-CM | POA: Diagnosis not present

## 2017-03-12 DIAGNOSIS — M7062 Trochanteric bursitis, left hip: Secondary | ICD-10-CM | POA: Diagnosis not present

## 2017-03-18 DIAGNOSIS — M7062 Trochanteric bursitis, left hip: Secondary | ICD-10-CM | POA: Diagnosis not present

## 2017-03-24 DIAGNOSIS — M7062 Trochanteric bursitis, left hip: Secondary | ICD-10-CM | POA: Diagnosis not present

## 2017-06-10 DIAGNOSIS — Z23 Encounter for immunization: Secondary | ICD-10-CM | POA: Diagnosis not present

## 2017-06-10 DIAGNOSIS — Z0000 Encounter for general adult medical examination without abnormal findings: Secondary | ICD-10-CM | POA: Diagnosis not present

## 2017-06-10 DIAGNOSIS — I10 Essential (primary) hypertension: Secondary | ICD-10-CM | POA: Diagnosis not present

## 2017-06-10 DIAGNOSIS — E7800 Pure hypercholesterolemia, unspecified: Secondary | ICD-10-CM | POA: Diagnosis not present

## 2017-06-16 ENCOUNTER — Other Ambulatory Visit: Payer: Self-pay | Admitting: Family Medicine

## 2017-06-16 DIAGNOSIS — Z1231 Encounter for screening mammogram for malignant neoplasm of breast: Secondary | ICD-10-CM

## 2017-06-17 ENCOUNTER — Ambulatory Visit
Admission: RE | Admit: 2017-06-17 | Discharge: 2017-06-17 | Disposition: A | Discharge status: Home / Self Care | Payer: Medicare Other | Source: Ambulatory Visit | Attending: Family Medicine | Admitting: Family Medicine

## 2017-06-17 DIAGNOSIS — Z1231 Encounter for screening mammogram for malignant neoplasm of breast: Secondary | ICD-10-CM

## 2017-06-18 ENCOUNTER — Other Ambulatory Visit: Payer: Self-pay | Admitting: Family Medicine

## 2017-06-18 DIAGNOSIS — R928 Other abnormal and inconclusive findings on diagnostic imaging of breast: Secondary | ICD-10-CM

## 2017-06-23 ENCOUNTER — Other Ambulatory Visit: Payer: Self-pay | Admitting: Family Medicine

## 2017-06-23 ENCOUNTER — Ambulatory Visit
Admission: RE | Admit: 2017-06-23 | Discharge: 2017-06-23 | Disposition: A | Discharge status: Home / Self Care | Payer: Medicare Other | Source: Ambulatory Visit | Attending: Family Medicine | Admitting: Family Medicine

## 2017-06-23 DIAGNOSIS — R928 Other abnormal and inconclusive findings on diagnostic imaging of breast: Secondary | ICD-10-CM

## 2017-06-23 DIAGNOSIS — N6310 Unspecified lump in the right breast, unspecified quadrant: Secondary | ICD-10-CM

## 2017-06-23 DIAGNOSIS — N6001 Solitary cyst of right breast: Secondary | ICD-10-CM | POA: Diagnosis not present

## 2017-06-25 ENCOUNTER — Ambulatory Visit
Admission: RE | Admit: 2017-06-25 | Discharge: 2017-06-25 | Disposition: A | Discharge status: Home / Self Care | Payer: Medicare Other | Source: Ambulatory Visit | Attending: Family Medicine | Admitting: Family Medicine

## 2017-06-25 ENCOUNTER — Other Ambulatory Visit: Payer: Self-pay | Admitting: Family Medicine

## 2017-06-25 DIAGNOSIS — N6310 Unspecified lump in the right breast, unspecified quadrant: Secondary | ICD-10-CM

## 2017-06-25 DIAGNOSIS — N6311 Unspecified lump in the right breast, upper outer quadrant: Secondary | ICD-10-CM | POA: Diagnosis not present

## 2017-06-25 DIAGNOSIS — C50919 Malignant neoplasm of unspecified site of unspecified female breast: Secondary | ICD-10-CM

## 2017-06-25 DIAGNOSIS — D0511 Intraductal carcinoma in situ of right breast: Secondary | ICD-10-CM | POA: Diagnosis not present

## 2017-06-25 DIAGNOSIS — R928 Other abnormal and inconclusive findings on diagnostic imaging of breast: Secondary | ICD-10-CM

## 2017-06-25 HISTORY — DX: Malignant neoplasm of unspecified site of unspecified female breast: C50.919

## 2017-06-26 DIAGNOSIS — H16223 Keratoconjunctivitis sicca, not specified as Sjogren's, bilateral: Secondary | ICD-10-CM | POA: Insufficient documentation

## 2017-06-26 DIAGNOSIS — H401223 Low-tension glaucoma, left eye, severe stage: Secondary | ICD-10-CM | POA: Diagnosis not present

## 2017-06-26 DIAGNOSIS — H401211 Low-tension glaucoma, right eye, mild stage: Secondary | ICD-10-CM | POA: Diagnosis not present

## 2017-06-30 ENCOUNTER — Ambulatory Visit: Payer: Self-pay | Admitting: Surgery

## 2017-06-30 ENCOUNTER — Other Ambulatory Visit: Payer: Self-pay | Admitting: Surgery

## 2017-06-30 DIAGNOSIS — D0511 Intraductal carcinoma in situ of right breast: Secondary | ICD-10-CM

## 2017-06-30 NOTE — H&P (Signed)
History of Present Illness (Yarissa Reining K. Dallis Darden MD; 06/30/2017 12:00 PM) The patient is a 76 year old female who presents with breast cancer. Referred by Dr. Michelle Collins for right breast DCIS  PCP - Dr. Le  This is a 76-year-old female who presents with recent diagnosis of right breast DCIS. The patient had a routine screening mammogram in 2017 that was unremarkable. Her annual mammogram in September 2018 showed a possible mass with calcifications in the right breast. Further workup was performed last week including ultrasound showing a complex cystic lesion in the right breast at 10:00 3 cm from the nipple measuring 1.2 x 0.5 x 0.8 cm. Biopsy was performed on 06/25/17 and revealed the diagnosis of right breast DCIS (low-grade). She is now referred for surgical evaluation.  Menarche age 12 First pregnancy age 19 Breast-feed no Hormones none Menopause late 50s Family history paternal grandmother had breast cancer and a sister had ductal carcinoma stage II Patient has had fibroadenomas excised from both sides.   Diagnosis Breast, right, needle core biopsy, upper outer quadrant - LOW GRADE DUCTAL CARCINOMA IN SITU. - SEE MICROSCOPIC DESCRIPTION. Microscopic Comment Estrogen and progesterone receptor will be performed. Dr. Kish agrees. Called to [facility] on 06/26/17. (JDP:gt, 06/26/17) JOHN PATRICK MD Pathologist, Electronic Signature  CLINICAL DATA: Screening.  EXAM: 2D DIGITAL SCREENING BILATERAL MAMMOGRAM WITH CAD AND ADJUNCT TOMO  COMPARISON: Previous exam(s).  ACR Breast Density Category b: There are scattered areas of fibroglandular density.  FINDINGS: In the right breast, a possible mass with calcifications warrants further evaluation. In the left breast, no findings suspicious for malignancy. Images were processed with CAD.  IMPRESSION: Further evaluation is suggested for possible mass with calcifications in the right  breast.  RECOMMENDATION: Diagnostic mammogram and possibly ultrasound of the right breast. (Code:[id number])  The patient will be contacted regarding the findings, and additional imaging will be scheduled.  BI-RADS CATEGORY 0: Incomplete. Need additional imaging evaluation and/or prior mammograms for comparison.   Electronically Signed By: Michelle Collins M.D. On: 06/17/2017 13:08  CLINICAL DATA: Patient was called back from screening mammogram for a possible mass in the right breast.  EXAM: 2D DIGITAL DIAGNOSTIC RIGHT MAMMOGRAM WITH CAD AND ADJUNCT TOMO  ULTRASOUND RIGHT BREAST  COMPARISON: With priors.  ACR Breast Density Category b: There are scattered areas of fibroglandular density.  FINDINGS: Additional imaging of the right breast was performed. There is persistence of a developing mass with punctate calcifications in the upper-outer quadrant of the right breast measuring 1.5 x 0.5 x 1.1 cm. No additional masses are seen in the right breast.  Mammographic images were processed with CAD.  On physical exam, I do not palpate a discrete mass in the upper-outer quadrant of the right breast.  Targeted ultrasound is performed, showing there it is a complex cystic lesion in the right breast at 10 o'clock 3 cm from the nipple. Echogenic foci are seen within it compatible with calcifications. It measures 1.2 x 0.5 x 0.8 cm. Sonographic evaluation of the right axilla does not show any enlarged adenopathy.  IMPRESSION: Suspicious mass and calcifications in the 10 o'clock region of the right breast.  RECOMMENDATION: Ultrasound-guided core biopsy of the right breast lesion is recommended. The biopsy will be scheduled at the patient's convenience.  I have discussed the findings and recommendations with the patient. Results were also provided in writing at the conclusion of the visit. If applicable, a reminder letter will be sent to the patient regarding the  next   appointment.  BI-RADS CATEGORY 4: Suspicious.   Electronically Signed By: Dina Arceo M.D. On: 06/23/2017 13:40  CLINICAL DATA: 76-year-old female presenting for ultrasound-guided biopsy of a right breast mass.  EXAM: ULTRASOUND GUIDED RIGHT BREAST CORE NEEDLE BIOPSY  COMPARISON: Previous exam(s).  FINDINGS: I met with the patient and we discussed the procedure of ultrasound-guided biopsy, including benefits and alternatives. We discussed the high likelihood of a successful procedure. We discussed the risks of the procedure, including infection, bleeding, tissue injury, clip migration, and inadequate sampling. Informed written consent was given. The usual time-out protocol was performed immediately prior to the procedure.  Lesion quadrant: Upper-outer quad  Using sterile technique and 1% Lidocaine as local anesthetic, under direct ultrasound visualization, a 14 gauge spring-loaded device was used to perform biopsy of a mass in the right breast at 10 o'clock using a medial approach. At the conclusion of the procedure a ribbon shaped tissue marker clip was deployed into the biopsy cavity. Follow up 2 view mammogram was performed and dictated separately.  IMPRESSION: Ultrasound guided biopsy of a breast mass at 10 o'clock. No apparent complications.  Electronically Signed: By: Michelle Collins M.D. On: 06/25/2017 15:24     Past Surgical History (Tanisha A. Brown, RMA; 06/30/2017 11:16 AM) Breast Mass; Local Excision Bilateral. Cataract Surgery Bilateral. Cesarean Section - Multiple Colon Polyp Removal - Colonoscopy  Diagnostic Studies History (Tanisha A. Brown, RMA; 06/30/2017 11:16 AM) Colonoscopy 1-5 years ago Mammogram within last year  Allergies (Tanisha A. Brown, RMA; 06/30/2017 11:19 AM) Codeine Phosphate *ANALGESICS - OPIOID* Latex Anesthesia S/I-60 *GENERAL ANESTHETICS* Vomiting. Allergies Reconciled  Medication History (Tanisha A.  Brown, RMA; 06/30/2017 11:20 AM) Triamterene-HCTZ (37.5-25MG Tablet, Oral) Active. Latanoprost (0.005% Solution, Ophthalmic) Active. Dicyclomine HCl (20MG Tablet, Oral) Active. Medications Reconciled  Social History (Tanisha A. Brown, RMA; 06/30/2017 11:16 AM) Alcohol use Occasional alcohol use. Caffeine use Carbonated beverages, Coffee, Tea. No drug use Tobacco use Former smoker.  Family History (Tanisha A. Brown, RMA; 06/30/2017 11:16 AM) Arthritis Mother, Sister. Breast Cancer Family Members In General, Sister. Cancer Father. Cerebrovascular Accident Family Members In General. Colon Cancer Family Members In General. Colon Polyps Son. Heart Disease Brother. Hypertension Brother, Sister, Son. Respiratory Condition Family Members In General.  Pregnancy / Birth History (Tanisha A. Brown, RMA; 06/30/2017 11:16 AM) Age at menarche 12 years. Age of menopause 56-60 Gravida 4 Maternal age 15-20 Para 4  Other Problems (Tanisha A. Brown, RMA; 06/30/2017 11:16 AM) Hemorrhoids High blood pressure     Review of Systems (Tanisha A. Brown RMA; 06/30/2017 11:16 AM) General Present- Night Sweats. Not Present- Appetite Loss, Chills, Fatigue, Fever, Weight Gain and Weight Loss. Skin Not Present- Change in Wart/Mole, Dryness, Hives, Jaundice, New Lesions, Non-Healing Wounds, Rash and Ulcer. HEENT Present- Seasonal Allergies and Wears glasses/contact lenses. Not Present- Earache, Hearing Loss, Hoarseness, Nose Bleed, Oral Ulcers, Ringing in the Ears, Sinus Pain, Sore Throat, Visual Disturbances and Yellow Eyes. Respiratory Not Present- Bloody sputum, Chronic Cough, Difficulty Breathing, Snoring and Wheezing. Breast Present- Breast Pain. Not Present- Breast Mass, Nipple Discharge and Skin Changes. Cardiovascular Not Present- Chest Pain, Difficulty Breathing Lying Down, Leg Cramps, Palpitations, Rapid Heart Rate, Shortness of Breath and Swelling of  Extremities. Gastrointestinal Present- Hemorrhoids. Not Present- Abdominal Pain, Bloating, Bloody Stool, Change in Bowel Habits, Chronic diarrhea, Constipation, Difficulty Swallowing, Excessive gas, Gets full quickly at meals, Indigestion, Nausea, Rectal Pain and Vomiting. Female Genitourinary Not Present- Frequency, Nocturia, Painful Urination, Pelvic Pain and Urgency. Musculoskeletal Not Present- Back Pain, Joint Pain, Joint Stiffness,   Muscle Pain, Muscle Weakness and Swelling of Extremities. Neurological Not Present- Decreased Memory, Fainting, Headaches, Numbness, Seizures, Tingling, Tremor, Trouble walking and Weakness. Psychiatric Not Present- Anxiety, Bipolar, Change in Sleep Pattern, Depression, Fearful and Frequent crying. Endocrine Not Present- Cold Intolerance, Excessive Hunger, Hair Changes, Heat Intolerance, Hot flashes and New Diabetes. Hematology Not Present- Blood Thinners, Easy Bruising, Excessive bleeding, Gland problems, HIV and Persistent Infections.  Vitals (Tanisha A. Brown RMA; 06/30/2017 11:17 AM) 06/30/2017 11:17 AM Weight: 194.2 lb Height: 64in Body Surface Area: 1.93 m Body Mass Index: 33.33 kg/m  Temp.: 97.5F  Pulse: 105 (Regular)  BP: 136/82 (Sitting, Left Arm, Standard)      Physical Exam (Jeremie Giangrande K. Tirso Laws MD; 06/30/2017 12:02 PM)  The physical exam findings are as follows: Note:WDWN in NAD Eyes: Pupils equal, round; sclera anicteric HENT: Oral mucosa moist; good dentition Neck: No masses palpated, no thyromegaly Lungs: CTA bilaterally; normal respiratory effort Breasts: symmetric; mild bruising in right breast from biopsy site; no dominant masses; no nipple retraction or discharge; no axillary lymphadenopathy CV: Regular rate and rhythm; no murmurs; extremities well-perfused with no edema Abd: +bowel sounds, soft, non-tender, no palpable organomegaly; no palpable hernias Skin: Warm, dry; no sign of jaundice Psychiatric - alert and oriented x  4; calm mood and affect    Assessment & Plan (Moises Terpstra K. Argusta Mcgann MD; 06/30/2017 11:54 AM)  DUCTAL CARCINOMA IN SITU (DCIS) OF RIGHT BREAST (D05.11)  Current Plans Schedule for Surgery - Right radioactive seed localized lumpectomy. The surgical procedure has been discussed with the patient. Potential risks, benefits, alternative treatments, and expected outcomes have been explained. All of the patient's questions at this time have been answered. The likelihood of reaching the patient's treatment goal is good. The patient understand the proposed surgical procedure and wishes to proceed  Avontae Burkhead K. Roshelle Traub, MD, FACS [facility]  General/ Trauma Surgery  06/30/2017 12:02 PM   

## 2017-06-30 NOTE — H&P (View-Only) (Signed)
History of Present Illness (Matthew K. Tsuei MD; 06/30/2017 12:00 PM) The patient is a 76 year old female who presents with breast cancer. Referred by Dr. Michelle Collins for right breast DCIS  PCP - Dr. Le  This is a 76-year-old female who presents with recent diagnosis of right breast DCIS. The patient had a routine screening mammogram in 2017 that was unremarkable. Her annual mammogram in September 2018 showed a possible mass with calcifications in the right breast. Further workup was performed last week including ultrasound showing a complex cystic lesion in the right breast at 10:00 3 cm from the nipple measuring 1.2 x 0.5 x 0.8 cm. Biopsy was performed on 06/25/17 and revealed the diagnosis of right breast DCIS (low-grade). She is now referred for surgical evaluation.  Menarche age 12 First pregnancy age 19 Breast-feed no Hormones none Menopause late 50s Family history paternal grandmother had breast cancer and a sister had ductal carcinoma stage II Patient has had fibroadenomas excised from both sides.   Diagnosis Breast, right, needle core biopsy, upper outer quadrant - LOW GRADE DUCTAL CARCINOMA IN SITU. - SEE MICROSCOPIC DESCRIPTION. Microscopic Comment Estrogen and progesterone receptor will be performed. Dr. Kish agrees. Called to [facility] on 06/26/17. (JDP:gt, 06/26/17) JOHN PATRICK MD Pathologist, Electronic Signature  CLINICAL DATA: Screening.  EXAM: 2D DIGITAL SCREENING BILATERAL MAMMOGRAM WITH CAD AND ADJUNCT TOMO  COMPARISON: Previous exam(s).  ACR Breast Density Category b: There are scattered areas of fibroglandular density.  FINDINGS: In the right breast, a possible mass with calcifications warrants further evaluation. In the left breast, no findings suspicious for malignancy. Images were processed with CAD.  IMPRESSION: Further evaluation is suggested for possible mass with calcifications in the right  breast.  RECOMMENDATION: Diagnostic mammogram and possibly ultrasound of the right breast. (Code:[id number])  The patient will be contacted regarding the findings, and additional imaging will be scheduled.  BI-RADS CATEGORY 0: Incomplete. Need additional imaging evaluation and/or prior mammograms for comparison.   Electronically Signed By: Michelle Collins M.D. On: 06/17/2017 13:08  CLINICAL DATA: Patient was called back from screening mammogram for a possible mass in the right breast.  EXAM: 2D DIGITAL DIAGNOSTIC RIGHT MAMMOGRAM WITH CAD AND ADJUNCT TOMO  ULTRASOUND RIGHT BREAST  COMPARISON: With priors.  ACR Breast Density Category b: There are scattered areas of fibroglandular density.  FINDINGS: Additional imaging of the right breast was performed. There is persistence of a developing mass with punctate calcifications in the upper-outer quadrant of the right breast measuring 1.5 x 0.5 x 1.1 cm. No additional masses are seen in the right breast.  Mammographic images were processed with CAD.  On physical exam, I do not palpate a discrete mass in the upper-outer quadrant of the right breast.  Targeted ultrasound is performed, showing there it is a complex cystic lesion in the right breast at 10 o'clock 3 cm from the nipple. Echogenic foci are seen within it compatible with calcifications. It measures 1.2 x 0.5 x 0.8 cm. Sonographic evaluation of the right axilla does not show any enlarged adenopathy.  IMPRESSION: Suspicious mass and calcifications in the 10 o'clock region of the right breast.  RECOMMENDATION: Ultrasound-guided core biopsy of the right breast lesion is recommended. The biopsy will be scheduled at the patient's convenience.  I have discussed the findings and recommendations with the patient. Results were also provided in writing at the conclusion of the visit. If applicable, a reminder letter will be sent to the patient regarding the  next   appointment.  BI-RADS CATEGORY 4: Suspicious.   Electronically Signed By: Dina Arceo M.D. On: 06/23/2017 13:40  CLINICAL DATA: 76-year-old female presenting for ultrasound-guided biopsy of a right breast mass.  EXAM: ULTRASOUND GUIDED RIGHT BREAST CORE NEEDLE BIOPSY  COMPARISON: Previous exam(s).  FINDINGS: I met with the patient and we discussed the procedure of ultrasound-guided biopsy, including benefits and alternatives. We discussed the high likelihood of a successful procedure. We discussed the risks of the procedure, including infection, bleeding, tissue injury, clip migration, and inadequate sampling. Informed written consent was given. The usual time-out protocol was performed immediately prior to the procedure.  Lesion quadrant: Upper-outer quad  Using sterile technique and 1% Lidocaine as local anesthetic, under direct ultrasound visualization, a 14 gauge spring-loaded device was used to perform biopsy of a mass in the right breast at 10 o'clock using a medial approach. At the conclusion of the procedure a ribbon shaped tissue marker clip was deployed into the biopsy cavity. Follow up 2 view mammogram was performed and dictated separately.  IMPRESSION: Ultrasound guided biopsy of a breast mass at 10 o'clock. No apparent complications.  Electronically Signed: By: Michelle Collins M.D. On: 06/25/2017 15:24     Past Surgical History (Tanisha A. Brown, RMA; 06/30/2017 11:16 AM) Breast Mass; Local Excision Bilateral. Cataract Surgery Bilateral. Cesarean Section - Multiple Colon Polyp Removal - Colonoscopy  Diagnostic Studies History (Tanisha A. Brown, RMA; 06/30/2017 11:16 AM) Colonoscopy 1-5 years ago Mammogram within last year  Allergies (Tanisha A. Brown, RMA; 06/30/2017 11:19 AM) Codeine Phosphate *ANALGESICS - OPIOID* Latex Anesthesia S/I-60 *GENERAL ANESTHETICS* Vomiting. Allergies Reconciled  Medication History (Tanisha A.  Brown, RMA; 06/30/2017 11:20 AM) Triamterene-HCTZ (37.5-25MG Tablet, Oral) Active. Latanoprost (0.005% Solution, Ophthalmic) Active. Dicyclomine HCl (20MG Tablet, Oral) Active. Medications Reconciled  Social History (Tanisha A. Brown, RMA; 06/30/2017 11:16 AM) Alcohol use Occasional alcohol use. Caffeine use Carbonated beverages, Coffee, Tea. No drug use Tobacco use Former smoker.  Family History (Tanisha A. Brown, RMA; 06/30/2017 11:16 AM) Arthritis Mother, Sister. Breast Cancer Family Members In General, Sister. Cancer Father. Cerebrovascular Accident Family Members In General. Colon Cancer Family Members In General. Colon Polyps Son. Heart Disease Brother. Hypertension Brother, Sister, Son. Respiratory Condition Family Members In General.  Pregnancy / Birth History (Tanisha A. Brown, RMA; 06/30/2017 11:16 AM) Age at menarche 12 years. Age of menopause 56-60 Gravida 4 Maternal age 15-20 Para 4  Other Problems (Tanisha A. Brown, RMA; 06/30/2017 11:16 AM) Hemorrhoids High blood pressure     Review of Systems (Tanisha A. Brown RMA; 06/30/2017 11:16 AM) General Present- Night Sweats. Not Present- Appetite Loss, Chills, Fatigue, Fever, Weight Gain and Weight Loss. Skin Not Present- Change in Wart/Mole, Dryness, Hives, Jaundice, New Lesions, Non-Healing Wounds, Rash and Ulcer. HEENT Present- Seasonal Allergies and Wears glasses/contact lenses. Not Present- Earache, Hearing Loss, Hoarseness, Nose Bleed, Oral Ulcers, Ringing in the Ears, Sinus Pain, Sore Throat, Visual Disturbances and Yellow Eyes. Respiratory Not Present- Bloody sputum, Chronic Cough, Difficulty Breathing, Snoring and Wheezing. Breast Present- Breast Pain. Not Present- Breast Mass, Nipple Discharge and Skin Changes. Cardiovascular Not Present- Chest Pain, Difficulty Breathing Lying Down, Leg Cramps, Palpitations, Rapid Heart Rate, Shortness of Breath and Swelling of  Extremities. Gastrointestinal Present- Hemorrhoids. Not Present- Abdominal Pain, Bloating, Bloody Stool, Change in Bowel Habits, Chronic diarrhea, Constipation, Difficulty Swallowing, Excessive gas, Gets full quickly at meals, Indigestion, Nausea, Rectal Pain and Vomiting. Female Genitourinary Not Present- Frequency, Nocturia, Painful Urination, Pelvic Pain and Urgency. Musculoskeletal Not Present- Back Pain, Joint Pain, Joint Stiffness,   Muscle Pain, Muscle Weakness and Swelling of Extremities. Neurological Not Present- Decreased Memory, Fainting, Headaches, Numbness, Seizures, Tingling, Tremor, Trouble walking and Weakness. Psychiatric Not Present- Anxiety, Bipolar, Change in Sleep Pattern, Depression, Fearful and Frequent crying. Endocrine Not Present- Cold Intolerance, Excessive Hunger, Hair Changes, Heat Intolerance, Hot flashes and New Diabetes. Hematology Not Present- Blood Thinners, Easy Bruising, Excessive bleeding, Gland problems, HIV and Persistent Infections.  Vitals (Tanisha A. Brown RMA; 06/30/2017 11:17 AM) 06/30/2017 11:17 AM Weight: 194.2 lb Height: 64in Body Surface Area: 1.93 m Body Mass Index: 33.33 kg/m  Temp.: 97.47F  Pulse: 105 (Regular)  BP: 136/82 (Sitting, Left Arm, Standard)      Physical Exam Rodman Key K. Ruberta Holck MD; 06/30/2017 12:02 PM)  The physical exam findings are as follows: Note:WDWN in NAD Eyes: Pupils equal, round; sclera anicteric HENT: Oral mucosa moist; good dentition Neck: No masses palpated, no thyromegaly Lungs: CTA bilaterally; normal respiratory effort Breasts: symmetric; mild bruising in right breast from biopsy site; no dominant masses; no nipple retraction or discharge; no axillary lymphadenopathy CV: Regular rate and rhythm; no murmurs; extremities well-perfused with no edema Abd: +bowel sounds, soft, non-tender, no palpable organomegaly; no palpable hernias Skin: Warm, dry; no sign of jaundice Psychiatric - alert and oriented x  4; calm mood and affect    Assessment & Plan Rodman Key K. Zorion Nims MD; 06/30/2017 11:54 AM)  Aliene Altes CARCINOMA IN SITU (DCIS) OF RIGHT BREAST (D05.11)  Current Plans Schedule for Surgery - Right radioactive seed localized lumpectomy. The surgical procedure has been discussed with the patient. Potential risks, benefits, alternative treatments, and expected outcomes have been explained. All of the patient's questions at this time have been answered. The likelihood of reaching the patient's treatment goal is good. The patient understand the proposed surgical procedure and wishes to proceed  Imogene Burn. Georgette Dover, MD, [facility] Surgery  General/ Trauma Surgery  06/30/2017 12:02 PM

## 2017-07-02 ENCOUNTER — Encounter: Payer: Self-pay | Admitting: Radiation Oncology

## 2017-07-04 ENCOUNTER — Telehealth: Payer: Self-pay | Admitting: Hematology and Oncology

## 2017-07-04 ENCOUNTER — Encounter: Payer: Self-pay | Admitting: Hematology and Oncology

## 2017-07-04 NOTE — Telephone Encounter (Signed)
Appt has been scheduled for the pt to see Dr. Lindi Adie on 11/6 at 1pm. Gave appt date and time to the pt's husband. Letter mailed.

## 2017-07-08 ENCOUNTER — Encounter (HOSPITAL_BASED_OUTPATIENT_CLINIC_OR_DEPARTMENT_OTHER): Payer: Self-pay | Admitting: *Deleted

## 2017-07-10 ENCOUNTER — Encounter (HOSPITAL_BASED_OUTPATIENT_CLINIC_OR_DEPARTMENT_OTHER)
Admission: RE | Admit: 2017-07-10 | Discharge: 2017-07-10 | Disposition: A | Discharge status: Home / Self Care | Payer: Medicare Other | Source: Ambulatory Visit | Attending: Surgery | Admitting: Surgery

## 2017-07-10 DIAGNOSIS — K5730 Diverticulosis of large intestine without perforation or abscess without bleeding: Secondary | ICD-10-CM | POA: Diagnosis not present

## 2017-07-10 DIAGNOSIS — Z01812 Encounter for preprocedural laboratory examination: Secondary | ICD-10-CM | POA: Diagnosis not present

## 2017-07-10 DIAGNOSIS — Z01810 Encounter for preprocedural cardiovascular examination: Secondary | ICD-10-CM | POA: Diagnosis not present

## 2017-07-10 DIAGNOSIS — E669 Obesity, unspecified: Secondary | ICD-10-CM | POA: Diagnosis not present

## 2017-07-10 DIAGNOSIS — Z803 Family history of malignant neoplasm of breast: Secondary | ICD-10-CM | POA: Diagnosis not present

## 2017-07-10 DIAGNOSIS — D0591 Unspecified type of carcinoma in situ of right breast: Secondary | ICD-10-CM | POA: Insufficient documentation

## 2017-07-10 DIAGNOSIS — R9431 Abnormal electrocardiogram [ECG] [EKG]: Secondary | ICD-10-CM | POA: Insufficient documentation

## 2017-07-10 DIAGNOSIS — I10 Essential (primary) hypertension: Secondary | ICD-10-CM | POA: Diagnosis not present

## 2017-07-10 DIAGNOSIS — E785 Hyperlipidemia, unspecified: Secondary | ICD-10-CM | POA: Diagnosis not present

## 2017-07-10 LAB — BASIC METABOLIC PANEL
ANION GAP: 11 (ref 5–15)
BUN: 15 mg/dL (ref 6–20)
CALCIUM: 9.1 mg/dL (ref 8.9–10.3)
CO2: 24 mmol/L (ref 22–32)
Chloride: 102 mmol/L (ref 101–111)
Creatinine, Ser: 0.93 mg/dL (ref 0.44–1.00)
GFR, EST NON AFRICAN AMERICAN: 58 mL/min — AB (ref >60)
GLUCOSE: 142 mg/dL — AB (ref 65–99)
POTASSIUM: 4 mmol/L (ref 3.5–5.1)
SODIUM: 137 mmol/L (ref 135–145)

## 2017-07-10 NOTE — Progress Notes (Signed)
EKG reviewed by Dr. Linna Caprice, will proceed with surgery as scheduled, Ensure pre surgery drink given with instructions to complete by 0900 dos, pt verbalized understanding.

## 2017-07-14 ENCOUNTER — Ambulatory Visit
Admission: RE | Admit: 2017-07-14 | Discharge: 2017-07-14 | Disposition: A | Discharge status: Home / Self Care | Payer: Medicare Other | Source: Ambulatory Visit | Attending: Surgery | Admitting: Surgery

## 2017-07-14 DIAGNOSIS — D0511 Intraductal carcinoma in situ of right breast: Secondary | ICD-10-CM | POA: Diagnosis not present

## 2017-07-15 ENCOUNTER — Encounter (HOSPITAL_BASED_OUTPATIENT_CLINIC_OR_DEPARTMENT_OTHER): Payer: Self-pay | Admitting: Anesthesiology

## 2017-07-15 ENCOUNTER — Ambulatory Visit (HOSPITAL_BASED_OUTPATIENT_CLINIC_OR_DEPARTMENT_OTHER): Payer: Medicare Other | Admitting: Anesthesiology

## 2017-07-15 ENCOUNTER — Encounter (HOSPITAL_BASED_OUTPATIENT_CLINIC_OR_DEPARTMENT_OTHER)
Admission: RE | Disposition: A | Discharge status: Home / Self Care | Payer: Self-pay | Source: Ambulatory Visit | Attending: Surgery

## 2017-07-15 ENCOUNTER — Ambulatory Visit
Admission: RE | Admit: 2017-07-15 | Discharge: 2017-07-15 | Disposition: A | Discharge status: Home / Self Care | Payer: Medicare Other | Source: Ambulatory Visit | Attending: Surgery | Admitting: Surgery

## 2017-07-15 ENCOUNTER — Ambulatory Visit (HOSPITAL_BASED_OUTPATIENT_CLINIC_OR_DEPARTMENT_OTHER)
Admission: RE | Admit: 2017-07-15 | Discharge: 2017-07-15 | Disposition: A | Discharge status: Home / Self Care | Payer: Medicare Other | Source: Ambulatory Visit | Attending: Surgery | Admitting: Surgery

## 2017-07-15 DIAGNOSIS — Z91040 Latex allergy status: Secondary | ICD-10-CM | POA: Diagnosis not present

## 2017-07-15 DIAGNOSIS — C50411 Malignant neoplasm of upper-outer quadrant of right female breast: Secondary | ICD-10-CM | POA: Insufficient documentation

## 2017-07-15 DIAGNOSIS — D0511 Intraductal carcinoma in situ of right breast: Secondary | ICD-10-CM

## 2017-07-15 DIAGNOSIS — Z803 Family history of malignant neoplasm of breast: Secondary | ICD-10-CM | POA: Diagnosis not present

## 2017-07-15 DIAGNOSIS — E785 Hyperlipidemia, unspecified: Secondary | ICD-10-CM | POA: Diagnosis not present

## 2017-07-15 DIAGNOSIS — R928 Other abnormal and inconclusive findings on diagnostic imaging of breast: Secondary | ICD-10-CM | POA: Diagnosis not present

## 2017-07-15 DIAGNOSIS — Z79899 Other long term (current) drug therapy: Secondary | ICD-10-CM | POA: Insufficient documentation

## 2017-07-15 DIAGNOSIS — Z884 Allergy status to anesthetic agent status: Secondary | ICD-10-CM | POA: Insufficient documentation

## 2017-07-15 DIAGNOSIS — N6011 Diffuse cystic mastopathy of right breast: Secondary | ICD-10-CM | POA: Diagnosis not present

## 2017-07-15 DIAGNOSIS — Z170 Estrogen receptor positive status [ER+]: Secondary | ICD-10-CM | POA: Diagnosis not present

## 2017-07-15 DIAGNOSIS — Z87891 Personal history of nicotine dependence: Secondary | ICD-10-CM | POA: Diagnosis not present

## 2017-07-15 DIAGNOSIS — K5790 Diverticulosis of intestine, part unspecified, without perforation or abscess without bleeding: Secondary | ICD-10-CM | POA: Diagnosis not present

## 2017-07-15 DIAGNOSIS — Z885 Allergy status to narcotic agent status: Secondary | ICD-10-CM | POA: Insufficient documentation

## 2017-07-15 DIAGNOSIS — I10 Essential (primary) hypertension: Secondary | ICD-10-CM | POA: Diagnosis not present

## 2017-07-15 DIAGNOSIS — M1990 Unspecified osteoarthritis, unspecified site: Secondary | ICD-10-CM | POA: Insufficient documentation

## 2017-07-15 DIAGNOSIS — C50911 Malignant neoplasm of unspecified site of right female breast: Secondary | ICD-10-CM | POA: Diagnosis not present

## 2017-07-15 HISTORY — PX: BREAST LUMPECTOMY WITH RADIOACTIVE SEED LOCALIZATION: SHX6424

## 2017-07-15 HISTORY — DX: Essential (primary) hypertension: I10

## 2017-07-15 HISTORY — DX: Irritable bowel syndrome, unspecified: K58.9

## 2017-07-15 HISTORY — DX: Unspecified glaucoma: H40.9

## 2017-07-15 HISTORY — DX: Allergy status to unspecified drugs, medicaments and biological substances: Z88.9

## 2017-07-15 HISTORY — DX: Adverse effect of unspecified anesthetic, initial encounter: T41.45XA

## 2017-07-15 HISTORY — DX: Nausea with vomiting, unspecified: R11.2

## 2017-07-15 HISTORY — DX: Other complications of anesthesia, initial encounter: T88.59XA

## 2017-07-15 HISTORY — DX: Other specified postprocedural states: Z98.890

## 2017-07-15 HISTORY — DX: Unspecified osteoarthritis, unspecified site: M19.90

## 2017-07-15 HISTORY — DX: Malignant (primary) neoplasm, unspecified: C80.1

## 2017-07-15 SURGERY — BREAST LUMPECTOMY WITH RADIOACTIVE SEED LOCALIZATION
Anesthesia: General | Site: Breast | Laterality: Right

## 2017-07-15 MED ORDER — TRAMADOL HCL 50 MG PO TABS: 50.0000 mg | ORAL_TABLET | Freq: Four times a day (QID) | ORAL | 0 refills | Status: DC | PRN

## 2017-07-15 MED ORDER — DEXAMETHASONE SODIUM PHOSPHATE 4 MG/ML IJ SOLN
INTRAMUSCULAR | Status: DC | PRN
  Administered 2017-07-15: 10 mg via INTRAVENOUS

## 2017-07-15 MED ORDER — CEFAZOLIN SODIUM-DEXTROSE 2-4 GM/100ML-% IV SOLN
2.0000 g | INTRAVENOUS | Status: AC
  Administered 2017-07-15: 2 g via INTRAVENOUS

## 2017-07-15 MED ORDER — PROMETHAZINE HCL 25 MG/ML IJ SOLN: 6.2500 mg | INTRAMUSCULAR | Status: DC | PRN

## 2017-07-15 MED ORDER — FENTANYL CITRATE (PF) 100 MCG/2ML IJ SOLN
25.0000 ug | INTRAMUSCULAR | Status: DC | PRN
  Administered 2017-07-15: 50 ug via INTRAVENOUS
  Administered 2017-07-15: 25 ug via INTRAVENOUS

## 2017-07-15 MED ORDER — GABAPENTIN 300 MG PO CAPS
300.0000 mg | ORAL_CAPSULE | ORAL | Status: AC
  Administered 2017-07-15: 300 mg via ORAL

## 2017-07-15 MED ORDER — BUPIVACAINE-EPINEPHRINE 0.25% -1:200000 IJ SOLN
INTRAMUSCULAR | Status: DC | PRN
  Administered 2017-07-15: 10 mL

## 2017-07-15 MED ORDER — LIDOCAINE 2% (20 MG/ML) 5 ML SYRINGE
INTRAMUSCULAR | Status: DC | PRN
  Administered 2017-07-15: 60 mg via INTRAVENOUS

## 2017-07-15 MED ORDER — ONDANSETRON HCL 4 MG/2ML IJ SOLN
INTRAMUSCULAR | Status: AC
  Filled 2017-07-15: qty 2

## 2017-07-15 MED ORDER — PROPOFOL 10 MG/ML IV BOLUS
INTRAVENOUS | Status: DC | PRN
Start: 2017-07-15 — End: 2017-07-15
  Administered 2017-07-15: 200 mg via INTRAVENOUS

## 2017-07-15 MED ORDER — FENTANYL CITRATE (PF) 100 MCG/2ML IJ SOLN
INTRAMUSCULAR | Status: AC
  Filled 2017-07-15: qty 2

## 2017-07-15 MED ORDER — SCOPOLAMINE 1 MG/3DAYS TD PT72: 1.0000 | MEDICATED_PATCH | Freq: Once | TRANSDERMAL | Status: DC | PRN

## 2017-07-15 MED ORDER — BUPIVACAINE-EPINEPHRINE (PF) 0.25% -1:200000 IJ SOLN
INTRAMUSCULAR | Status: AC
  Filled 2017-07-15: qty 30

## 2017-07-15 MED ORDER — GABAPENTIN 300 MG PO CAPS
ORAL_CAPSULE | ORAL | Status: AC
  Filled 2017-07-15: qty 1

## 2017-07-15 MED ORDER — ACETAMINOPHEN 500 MG PO TABS
ORAL_TABLET | ORAL | Status: AC
  Filled 2017-07-15: qty 2

## 2017-07-15 MED ORDER — DEXAMETHASONE SODIUM PHOSPHATE 10 MG/ML IJ SOLN
INTRAMUSCULAR | Status: AC
  Filled 2017-07-15: qty 1

## 2017-07-15 MED ORDER — ONDANSETRON HCL 4 MG/2ML IJ SOLN
INTRAMUSCULAR | Status: DC | PRN
  Administered 2017-07-15: 4 mg via INTRAVENOUS

## 2017-07-15 MED ORDER — LIDOCAINE 2% (20 MG/ML) 5 ML SYRINGE
INTRAMUSCULAR | Status: AC
  Filled 2017-07-15: qty 5

## 2017-07-15 MED ORDER — CHLORHEXIDINE GLUCONATE CLOTH 2 % EX PADS: 6.0000 | MEDICATED_PAD | Freq: Once | CUTANEOUS | Status: DC

## 2017-07-15 MED ORDER — CEFAZOLIN SODIUM-DEXTROSE 2-4 GM/100ML-% IV SOLN
INTRAVENOUS | Status: AC
  Filled 2017-07-15: qty 100

## 2017-07-15 MED ORDER — FENTANYL CITRATE (PF) 100 MCG/2ML IJ SOLN
50.0000 ug | INTRAMUSCULAR | Status: DC | PRN
  Administered 2017-07-15: 100 ug via INTRAVENOUS

## 2017-07-15 MED ORDER — MIDAZOLAM HCL 2 MG/2ML IJ SOLN: 1.0000 mg | INTRAMUSCULAR | Status: DC | PRN

## 2017-07-15 MED ORDER — LACTATED RINGERS IV SOLN
INTRAVENOUS | Status: DC
  Administered 2017-07-15 (×2): via INTRAVENOUS

## 2017-07-15 MED ORDER — PROPOFOL 10 MG/ML IV BOLUS
INTRAVENOUS | Status: AC
  Filled 2017-07-15: qty 20

## 2017-07-15 MED ORDER — ACETAMINOPHEN 500 MG PO TABS
1000.0000 mg | ORAL_TABLET | ORAL | Status: AC
  Administered 2017-07-15: 1000 mg via ORAL

## 2017-07-15 SURGICAL SUPPLY — 55 items
APL SKNCLS STERI-STRIP NONHPOA (GAUZE/BANDAGES/DRESSINGS) ×1
APPLIER CLIP 9.375 MED OPEN (MISCELLANEOUS) ×3
APR CLP MED 9.3 20 MLT OPN (MISCELLANEOUS) ×1
BENZOIN TINCTURE PRP APPL 2/3 (GAUZE/BANDAGES/DRESSINGS) ×3 IMPLANT
BLADE HEX COATED 2.75 (ELECTRODE) ×3 IMPLANT
BLADE SURG 15 STRL LF DISP TIS (BLADE) ×1 IMPLANT
BLADE SURG 15 STRL SS (BLADE) ×3
CANISTER SUC SOCK COL 7IN (MISCELLANEOUS) IMPLANT
CANISTER SUCT 1200ML W/VALVE (MISCELLANEOUS) ×2 IMPLANT
CHLORAPREP W/TINT 26ML (MISCELLANEOUS) ×3 IMPLANT
CLIP APPLIE 9.375 MED OPEN (MISCELLANEOUS) ×1 IMPLANT
CLOSURE WOUND 1/2 X4 (GAUZE/BANDAGES/DRESSINGS) ×1
COVER BACK TABLE 60X90IN (DRAPES) ×3 IMPLANT
COVER MAYO STAND STRL (DRAPES) ×3 IMPLANT
COVER PROBE W GEL 5X96 (DRAPES) ×3 IMPLANT
DECANTER SPIKE VIAL GLASS SM (MISCELLANEOUS) IMPLANT
DEVICE DUBIN W/COMP PLATE 8390 (MISCELLANEOUS) ×3 IMPLANT
DRAPE LAPAROTOMY 100X72 PEDS (DRAPES) ×3 IMPLANT
DRAPE UTILITY XL STRL (DRAPES) ×3 IMPLANT
DRSG TEGADERM 4X4.75 (GAUZE/BANDAGES/DRESSINGS) ×3 IMPLANT
ELECT REM PT RETURN 9FT ADLT (ELECTROSURGICAL) ×3
ELECTRODE REM PT RTRN 9FT ADLT (ELECTROSURGICAL) ×1 IMPLANT
GAUZE SPONGE 4X4 12PLY STRL LF (GAUZE/BANDAGES/DRESSINGS) IMPLANT
GLOVE BIO SURGEON STRL SZ7 (GLOVE) ×5 IMPLANT
GLOVE BIOGEL PI IND STRL 7.5 (GLOVE) ×1 IMPLANT
GLOVE BIOGEL PI INDICATOR 7.5 (GLOVE) ×4
GOWN STRL REUS W/ TWL LRG LVL3 (GOWN DISPOSABLE) ×2 IMPLANT
GOWN STRL REUS W/ TWL XL LVL3 (GOWN DISPOSABLE) IMPLANT
GOWN STRL REUS W/TWL LRG LVL3 (GOWN DISPOSABLE) ×6
GOWN STRL REUS W/TWL XL LVL3 (GOWN DISPOSABLE) ×3
ILLUMINATOR WAVEGUIDE N/F (MISCELLANEOUS) IMPLANT
KIT MARKER MARGIN INK (KITS) ×3 IMPLANT
LIGHT WAVEGUIDE WIDE FLAT (MISCELLANEOUS) IMPLANT
NDL HYPO 25X1 1.5 SAFETY (NEEDLE) ×1 IMPLANT
NEEDLE HYPO 25X1 1.5 SAFETY (NEEDLE) ×3 IMPLANT
NS IRRIG 1000ML POUR BTL (IV SOLUTION) ×3 IMPLANT
PACK BASIN DAY SURGERY FS (CUSTOM PROCEDURE TRAY) ×3 IMPLANT
PENCIL BUTTON HOLSTER BLD 10FT (ELECTRODE) ×3 IMPLANT
SLEEVE SCD COMPRESS KNEE MED (MISCELLANEOUS) ×3 IMPLANT
SPONGE GAUZE 2X2 8PLY STER LF (GAUZE/BANDAGES/DRESSINGS) ×1
SPONGE GAUZE 2X2 8PLY STRL LF (GAUZE/BANDAGES/DRESSINGS) ×1 IMPLANT
SPONGE LAP 18X18 X RAY DECT (DISPOSABLE) IMPLANT
SPONGE LAP 4X18 X RAY DECT (DISPOSABLE) ×3 IMPLANT
STRIP CLOSURE SKIN 1/2X4 (GAUZE/BANDAGES/DRESSINGS) ×2 IMPLANT
SUT MON AB 4-0 PC3 18 (SUTURE) ×3 IMPLANT
SUT SILK 2 0 SH (SUTURE) IMPLANT
SUT VIC AB 3-0 SH 27 (SUTURE) ×3
SUT VIC AB 3-0 SH 27X BRD (SUTURE) ×1 IMPLANT
SYR BULB 3OZ (MISCELLANEOUS) ×2 IMPLANT
SYR CONTROL 10ML LL (SYRINGE) ×3 IMPLANT
TOWEL OR 17X24 6PK STRL BLUE (TOWEL DISPOSABLE) ×3 IMPLANT
TOWEL OR NON WOVEN STRL DISP B (DISPOSABLE) ×3 IMPLANT
TUBE CONNECTING 20'X1/4 (TUBING) ×1
TUBE CONNECTING 20X1/4 (TUBING) ×1 IMPLANT
YANKAUER SUCT BULB TIP NO VENT (SUCTIONS) ×2 IMPLANT

## 2017-07-15 NOTE — Transfer of Care (Signed)
Immediate Anesthesia Transfer of Care Note  Patient: Cheyenne Lee  Procedure(s) Performed: RIGHT BREAST LUMPECTOMY WITH RADIOACTIVE SEED LOCALIZATION ERAS PATHWAY (Right Breast)  Patient Location: PACU  Anesthesia Type:General  Level of Consciousness: awake, sedated and lethargic  Airway & Oxygen Therapy: Patient Spontanous Breathing and Patient connected to face mask oxygen  Post-op Assessment: Report given to RN and Post -op Vital signs reviewed and stable  Post vital signs: Reviewed and stable  Last Vitals:  Vitals:   07/15/17 1117  BP: (!) 174/86  Pulse: (!) 103  Resp: 18  Temp: 37.7 C  SpO2: 99%    Last Pain:  Vitals:   07/15/17 1117  TempSrc: Oral         Complications: No apparent anesthesia complications

## 2017-07-15 NOTE — Interval H&P Note (Signed)
History and Physical Interval Note:  07/15/2017 10:48 AM  Cheyenne Lee  has presented today for surgery, with the diagnosis of Right breast DCIS  The various methods of treatment have been discussed with the patient and family. After consideration of risks, benefits and other options for treatment, the patient has consented to  Procedure(s) with comments: RIGHT BREAST LUMPECTOMY WITH RADIOACTIVE SEED LOCALIZATION ERAS PATHWAY (Right) - LMA as a surgical intervention .  The patient's history has been reviewed, patient examined, no change in status, stable for surgery.  I have reviewed the patient's chart and labs.  Questions were answered to the patient's satisfaction.     [name]

## 2017-07-15 NOTE — Anesthesia Postprocedure Evaluation (Signed)
Anesthesia Post Note  Patient: Cheyenne Lee  Procedure(s) Performed: RIGHT BREAST LUMPECTOMY WITH RADIOACTIVE SEED LOCALIZATION ERAS PATHWAY (Right Breast)     Patient location during evaluation: PACU Anesthesia Type: General Level of consciousness: awake and alert Pain management: pain level controlled Vital Signs Assessment: post-procedure vital signs reviewed and stable Respiratory status: spontaneous breathing, nonlabored ventilation, respiratory function stable and patient connected to nasal cannula oxygen Cardiovascular status: blood pressure returned to baseline and stable Postop Assessment: no apparent nausea or vomiting Anesthetic complications: no    Last Vitals:  Vitals:   07/15/17 1400 07/15/17 1427  BP: (!) 141/73 (!) 151/67  Pulse: 68 76  Resp: 13 18  Temp:  37.1 C  SpO2: 97% 97%    Last Pain:  Vitals:   07/15/17 1427  TempSrc:   PainSc: 2                  Tiajuana Amass

## 2017-07-15 NOTE — Anesthesia Procedure Notes (Addendum)
Procedure Name: LMA Insertion Date/Time: 07/15/2017 11:40 AM Performed by: Lyndee Leo Pre-anesthesia Checklist: Patient identified, Emergency Drugs available, Suction available and Patient being monitored Patient Re-evaluated:Patient Re-evaluated prior to induction Oxygen Delivery Method: Circle system utilized Preoxygenation: Pre-oxygenation with 100% oxygen Induction Type: IV induction Ventilation: Mask ventilation without difficulty LMA: LMA inserted LMA Size: 4.0 Number of attempts: 1 Airway Equipment and Method: Bite block Placement Confirmation: positive ETCO2 Tube secured with: Tape Dental Injury: Teeth and Oropharynx as per pre-operative assessment

## 2017-07-15 NOTE — H&P (View-Only) (Signed)
History of Present Illness (Cheyenne Bogan K. Oyinkansola Truax MD; 06/30/2017 12:00 PM) The patient is a 76 year old female who presents with breast cancer. Referred by Dr. Michelle Collins for right breast DCIS  PCP - Dr. Le  This is a 76-year-old female who presents with recent diagnosis of right breast DCIS. The patient had a routine screening mammogram in 2017 that was unremarkable. Her annual mammogram in September 2018 showed a possible mass with calcifications in the right breast. Further workup was performed last week including ultrasound showing a complex cystic lesion in the right breast at 10:00 3 cm from the nipple measuring 1.2 x 0.5 x 0.8 cm. Biopsy was performed on 06/25/17 and revealed the diagnosis of right breast DCIS (low-grade). She is now referred for surgical evaluation.  Menarche age 12 First pregnancy age 19 Breast-feed no Hormones none Menopause late 50s Family history paternal grandmother had breast cancer and a sister had ductal carcinoma stage II Patient has had fibroadenomas excised from both sides.   Diagnosis Breast, right, needle core biopsy, upper outer quadrant - LOW GRADE DUCTAL CARCINOMA IN SITU. - SEE MICROSCOPIC DESCRIPTION. Microscopic Comment Estrogen and progesterone receptor will be performed. Dr. Kish agrees. Called to [facility] on 06/26/17. (JDP:gt, 06/26/17) JOHN PATRICK MD Pathologist, Electronic Signature  CLINICAL DATA: Screening.  EXAM: 2D DIGITAL SCREENING BILATERAL MAMMOGRAM WITH CAD AND ADJUNCT TOMO  COMPARISON: Previous exam(s).  ACR Breast Density Category b: There are scattered areas of fibroglandular density.  FINDINGS: In the right breast, a possible mass with calcifications warrants further evaluation. In the left breast, no findings suspicious for malignancy. Images were processed with CAD.  IMPRESSION: Further evaluation is suggested for possible mass with calcifications in the right  breast.  RECOMMENDATION: Diagnostic mammogram and possibly ultrasound of the right breast. (Code:[id number])  The patient will be contacted regarding the findings, and additional imaging will be scheduled.  BI-RADS CATEGORY 0: Incomplete. Need additional imaging evaluation and/or prior mammograms for comparison.   Electronically Signed By: Michelle Collins M.D. On: 06/17/2017 13:08  CLINICAL DATA: Patient was called back from screening mammogram for a possible mass in the right breast.  EXAM: 2D DIGITAL DIAGNOSTIC RIGHT MAMMOGRAM WITH CAD AND ADJUNCT TOMO  ULTRASOUND RIGHT BREAST  COMPARISON: With priors.  ACR Breast Density Category b: There are scattered areas of fibroglandular density.  FINDINGS: Additional imaging of the right breast was performed. There is persistence of a developing mass with punctate calcifications in the upper-outer quadrant of the right breast measuring 1.5 x 0.5 x 1.1 cm. No additional masses are seen in the right breast.  Mammographic images were processed with CAD.  On physical exam, I do not palpate a discrete mass in the upper-outer quadrant of the right breast.  Targeted ultrasound is performed, showing there it is a complex cystic lesion in the right breast at 10 o'clock 3 cm from the nipple. Echogenic foci are seen within it compatible with calcifications. It measures 1.2 x 0.5 x 0.8 cm. Sonographic evaluation of the right axilla does not show any enlarged adenopathy.  IMPRESSION: Suspicious mass and calcifications in the 10 o'clock region of the right breast.  RECOMMENDATION: Ultrasound-guided core biopsy of the right breast lesion is recommended. The biopsy will be scheduled at the patient's convenience.  I have discussed the findings and recommendations with the patient. Results were also provided in writing at the conclusion of the visit. If applicable, a reminder letter will be sent to the patient regarding the  next   appointment.  BI-RADS CATEGORY 4: Suspicious.   Electronically Signed By: Dina Arceo M.D. On: 06/23/2017 13:40  CLINICAL DATA: 76-year-old female presenting for ultrasound-guided biopsy of a right breast mass.  EXAM: ULTRASOUND GUIDED RIGHT BREAST CORE NEEDLE BIOPSY  COMPARISON: Previous exam(s).  FINDINGS: I met with the patient and we discussed the procedure of ultrasound-guided biopsy, including benefits and alternatives. We discussed the high likelihood of a successful procedure. We discussed the risks of the procedure, including infection, bleeding, tissue injury, clip migration, and inadequate sampling. Informed written consent was given. The usual time-out protocol was performed immediately prior to the procedure.  Lesion quadrant: Upper-outer quad  Using sterile technique and 1% Lidocaine as local anesthetic, under direct ultrasound visualization, a 14 gauge spring-loaded device was used to perform biopsy of a mass in the right breast at 10 o'clock using a medial approach. At the conclusion of the procedure a ribbon shaped tissue marker clip was deployed into the biopsy cavity. Follow up 2 view mammogram was performed and dictated separately.  IMPRESSION: Ultrasound guided biopsy of a breast mass at 10 o'clock. No apparent complications.  Electronically Signed: By: Michelle Collins M.D. On: 06/25/2017 15:24     Past Surgical History (Tanisha A. Brown, RMA; 06/30/2017 11:16 AM) Breast Mass; Local Excision Bilateral. Cataract Surgery Bilateral. Cesarean Section - Multiple Colon Polyp Removal - Colonoscopy  Diagnostic Studies History (Tanisha A. Brown, RMA; 06/30/2017 11:16 AM) Colonoscopy 1-5 years ago Mammogram within last year  Allergies (Tanisha A. Brown, RMA; 06/30/2017 11:19 AM) Codeine Phosphate *ANALGESICS - OPIOID* Latex Anesthesia S/I-60 *GENERAL ANESTHETICS* Vomiting. Allergies Reconciled  Medication History (Tanisha A.  Brown, RMA; 06/30/2017 11:20 AM) Triamterene-HCTZ (37.5-25MG Tablet, Oral) Active. Latanoprost (0.005% Solution, Ophthalmic) Active. Dicyclomine HCl (20MG Tablet, Oral) Active. Medications Reconciled  Social History (Tanisha A. Brown, RMA; 06/30/2017 11:16 AM) Alcohol use Occasional alcohol use. Caffeine use Carbonated beverages, Coffee, Tea. No drug use Tobacco use Former smoker.  Family History (Tanisha A. Brown, RMA; 06/30/2017 11:16 AM) Arthritis Mother, Sister. Breast Cancer Family Members In General, Sister. Cancer Father. Cerebrovascular Accident Family Members In General. Colon Cancer Family Members In General. Colon Polyps Son. Heart Disease Brother. Hypertension Brother, Sister, Son. Respiratory Condition Family Members In General.  Pregnancy / Birth History (Tanisha A. Brown, RMA; 06/30/2017 11:16 AM) Age at menarche 12 years. Age of menopause 56-60 Gravida 4 Maternal age 15-20 Para 4  Other Problems (Tanisha A. Brown, RMA; 06/30/2017 11:16 AM) Hemorrhoids High blood pressure     Review of Systems (Tanisha A. Brown RMA; 06/30/2017 11:16 AM) General Present- Night Sweats. Not Present- Appetite Loss, Chills, Fatigue, Fever, Weight Gain and Weight Loss. Skin Not Present- Change in Wart/Mole, Dryness, Hives, Jaundice, New Lesions, Non-Healing Wounds, Rash and Ulcer. HEENT Present- Seasonal Allergies and Wears glasses/contact lenses. Not Present- Earache, Hearing Loss, Hoarseness, Nose Bleed, Oral Ulcers, Ringing in the Ears, Sinus Pain, Sore Throat, Visual Disturbances and Yellow Eyes. Respiratory Not Present- Bloody sputum, Chronic Cough, Difficulty Breathing, Snoring and Wheezing. Breast Present- Breast Pain. Not Present- Breast Mass, Nipple Discharge and Skin Changes. Cardiovascular Not Present- Chest Pain, Difficulty Breathing Lying Down, Leg Cramps, Palpitations, Rapid Heart Rate, Shortness of Breath and Swelling of  Extremities. Gastrointestinal Present- Hemorrhoids. Not Present- Abdominal Pain, Bloating, Bloody Stool, Change in Bowel Habits, Chronic diarrhea, Constipation, Difficulty Swallowing, Excessive gas, Gets full quickly at meals, Indigestion, Nausea, Rectal Pain and Vomiting. Female Genitourinary Not Present- Frequency, Nocturia, Painful Urination, Pelvic Pain and Urgency. Musculoskeletal Not Present- Back Pain, Joint Pain, Joint Stiffness,   Muscle Pain, Muscle Weakness and Swelling of Extremities. Neurological Not Present- Decreased Memory, Fainting, Headaches, Numbness, Seizures, Tingling, Tremor, Trouble walking and Weakness. Psychiatric Not Present- Anxiety, Bipolar, Change in Sleep Pattern, Depression, Fearful and Frequent crying. Endocrine Not Present- Cold Intolerance, Excessive Hunger, Hair Changes, Heat Intolerance, Hot flashes and New Diabetes. Hematology Not Present- Blood Thinners, Easy Bruising, Excessive bleeding, Gland problems, HIV and Persistent Infections.  Vitals (Tanisha A. Brown RMA; 06/30/2017 11:17 AM) 06/30/2017 11:17 AM Weight: 194.2 lb Height: 64in Body Surface Area: 1.93 m Body Mass Index: 33.33 kg/m  Temp.: 97.5F  Pulse: 105 (Regular)  BP: 136/82 (Sitting, Left Arm, Standard)      Physical Exam (Hazelynn Mckenny K. Dreshon Proffit MD; 06/30/2017 12:02 PM)  The physical exam findings are as follows: Note:WDWN in NAD Eyes: Pupils equal, round; sclera anicteric HENT: Oral mucosa moist; good dentition Neck: No masses palpated, no thyromegaly Lungs: CTA bilaterally; normal respiratory effort Breasts: symmetric; mild bruising in right breast from biopsy site; no dominant masses; no nipple retraction or discharge; no axillary lymphadenopathy CV: Regular rate and rhythm; no murmurs; extremities well-perfused with no edema Abd: +bowel sounds, soft, non-tender, no palpable organomegaly; no palpable hernias Skin: Warm, dry; no sign of jaundice Psychiatric - alert and oriented x  4; calm mood and affect    Assessment & Plan (Yaremi Stahlman K. Terryann Verbeek MD; 06/30/2017 11:54 AM)  DUCTAL CARCINOMA IN SITU (DCIS) OF RIGHT BREAST (D05.11)  Current Plans Schedule for Surgery - Right radioactive seed localized lumpectomy. The surgical procedure has been discussed with the patient. Potential risks, benefits, alternative treatments, and expected outcomes have been explained. All of the patient's questions at this time have been answered. The likelihood of reaching the patient's treatment goal is good. The patient understand the proposed surgical procedure and wishes to proceed  Neko Boyajian K. Shaka Cardin, MD, FACS [facility]  General/ Trauma Surgery  06/30/2017 12:02 PM   

## 2017-07-15 NOTE — Anesthesia Preprocedure Evaluation (Signed)
Anesthesia Evaluation  Patient identified by MRN, date of birth, ID band Patient awake    Reviewed: Allergy & Precautions, NPO status , Patient's Chart, lab work & pertinent test results  History of Anesthesia Complications (+) PONV  Airway Mallampati: II  TM Distance: >3 FB Neck ROM: Full    Dental  (+) Dental Advisory Given   Pulmonary neg pulmonary ROS,    breath sounds clear to auscultation       Cardiovascular hypertension, Pt. on medications  Rhythm:Regular Rate:Normal     Neuro/Psych negative neurological ROS     GI/Hepatic negative GI ROS, Neg liver ROS,   Endo/Other  negative endocrine ROS  Renal/GU negative Renal ROS     Musculoskeletal  (+) Arthritis ,   Abdominal   Peds  Hematology negative hematology ROS (+)   Anesthesia Other Findings   Reproductive/Obstetrics                            No results found for: WBC, HGB, HCT, MCV, PLT Lab Results  Component Value Date   CREATININE 0.93 07/10/2017   BUN 15 07/10/2017   NA 137 07/10/2017   K 4.0 07/10/2017   CL 102 07/10/2017   CO2 24 07/10/2017    Anesthesia Physical Anesthesia Plan  ASA: II  Anesthesia Plan: General   Post-op Pain Management:    Induction: Intravenous  PONV Risk Score and Plan: 4 or greater and Ondansetron, Dexamethasone, Midazolam and Treatment may vary due to age or medical condition  Airway Management Planned: LMA  Additional Equipment:   Intra-op Plan:   Post-operative Plan: Extubation in OR  Informed Consent: I have reviewed the patients History and Physical, chart, labs and discussed the procedure including the risks, benefits and alternatives for the proposed anesthesia with the patient or authorized representative who has indicated his/her understanding and acceptance.   Dental advisory given  Plan Discussed with: CRNA  Anesthesia Plan Comments:         Anesthesia  Quick Evaluation

## 2017-07-15 NOTE — Discharge Instructions (Signed)
[facility] °Office Phone Number [phone] ° °BREAST BIOPSY/ PARTIAL MASTECTOMY: POST OP INSTRUCTIONS ° °Always review your discharge instruction sheet given to you by the facility where your surgery was performed. ° °IF YOU HAVE DISABILITY OR FAMILY LEAVE FORMS, YOU MUST BRING THEM TO THE OFFICE FOR PROCESSING.  DO NOT GIVE THEM TO YOUR DOCTOR. ° °1. A prescription for pain medication may be given to you upon discharge.  Take your pain medication as prescribed, if needed.  If narcotic pain medicine is not needed, then you may take acetaminophen (Tylenol) or ibuprofen (Advil) as needed. °2. Take your usually prescribed medications unless otherwise directed °3. If you need a refill on your pain medication, please contact your pharmacy.  They will contact our office to request authorization.  Prescriptions will not be filled after 5pm or on week-ends. °4. You should eat very light the first 24 hours after surgery, such as soup, crackers, pudding, etc.  Resume your normal diet the day after surgery. °5. Most patients will experience some swelling and bruising in the breast.  Ice packs and a good support bra will help.  Swelling and bruising can take several days to resolve.  °6. It is common to experience some constipation if taking pain medication after surgery.  Increasing fluid intake and taking a stool softener will usually help or prevent this problem from occurring.  A mild laxative (Milk of Magnesia or Miralax) should be taken according to package directions if there are no bowel movements after 48 hours. °7. Unless discharge instructions indicate otherwise, you may remove your bandages 24-48 hours after surgery, and you may shower at that time.  You may have steri-strips (small skin tapes) in place directly over the incision.  These strips should be left on the skin for 7-10 days.  If your surgeon used skin glue on the incision, you may shower in 24 hours.  The glue will flake off over the  next 2-3 weeks.  Any sutures or staples will be removed at the office during your follow-up visit. °8. ACTIVITIES:  You may resume regular daily activities (gradually increasing) beginning the next day.  Wearing a good support bra or sports bra minimizes pain and swelling.  You may have sexual intercourse when it is comfortable. °a. You may drive when you no longer are taking prescription pain medication, you can comfortably wear a seatbelt, and you can safely maneuver your car and apply brakes. °b. RETURN TO WORK:  ______________________________________________________________________________________ °9. You should see your doctor in the office for a follow-up appointment approximately two weeks after your surgery.  Your doctor’s nurse will typically make your follow-up appointment when she calls you with your pathology report.  Expect your pathology report 2-3 business days after your surgery.  You may call to check if you do not hear from us after three days. °10. OTHER INSTRUCTIONS: _______________________________________________________________________________________________ _____________________________________________________________________________________________________________________________________ °_____________________________________________________________________________________________________________________________________ °_____________________________________________________________________________________________________________________________________ ° °WHEN TO CALL YOUR DOCTOR: °1. Fever over 101.0 °2. Nausea and/or vomiting. °3. Extreme swelling or bruising. °4. Continued bleeding from incision. °5. Increased pain, redness, or drainage from the incision. ° °The clinic staff is available to answer your questions during regular business hours.  Please don’t hesitate to call and ask to speak to one of the nurses for clinical concerns.  If you have a medical emergency, go to the nearest  emergency room or call 911.  A surgeon from [facility] is always on call at the hospital. ° °For further questions, please visit [email or web address]  ° ° ° ° °  Post Anesthesia Home Care Instructions ° °Activity: °Get plenty of rest for the remainder of the day. A responsible individual must stay with you for 24 hours following the procedure.  °For the next 24 hours, DO NOT: °-Drive a car °-Operate machinery °-Drink alcoholic beverages °-Take any medication unless instructed by your physician °-Make any legal decisions or sign important papers. ° °Meals: °Start with liquid foods such as gelatin or soup. Progress to regular foods as tolerated. Avoid greasy, spicy, heavy foods. If nausea and/or vomiting occur, drink only clear liquids until the nausea and/or vomiting subsides. Call your physician if vomiting continues. ° °Special Instructions/Symptoms: °Your throat may feel dry or sore from the anesthesia or the breathing tube placed in your throat during surgery. If this causes discomfort, gargle with warm salt water. The discomfort should disappear within 24 hours. ° °If you had a scopolamine patch placed behind your ear for the management of post- operative nausea and/or vomiting: ° °1. The medication in the patch is effective for 72 hours, after which it should be removed.  Wrap patch in a tissue and discard in the trash. Wash hands thoroughly with soap and water. °2. You may remove the patch earlier than 72 hours if you experience unpleasant side effects which may include dry mouth, dizziness or visual disturbances. °3. Avoid touching the patch. Wash your hands with soap and water after contact with the patch. °  ° °

## 2017-07-15 NOTE — Op Note (Signed)
Pre-op Diagnosis:  Right breast DCIS Post-op Diagnosis: same Procedure:  Right radioactive seed localized lumpectomy Surgeon:  Geneviene Tesch K. Anesthesia:  GEN - LMA Indications:  This is a 76 year old female who presents with recent diagnosis of right breast DCIS. The patient had a routine screening mammogram in 2017 that was unremarkable. Her annual mammogram in September 2018 showed a possible mass with calcifications in the right breast. Further workup was performed last week including ultrasound showing a complex cystic lesion in the right breast at 10:00 3 cm from the nipple measuring 1.2 x 0.5 x 0.8 cm. Biopsy was performed on 06/25/17 and revealed the diagnosis of right breast DCIS (low-grade).   Description of procedure: The patient is brought to the operating room placed in supine position on the operating room table. After an adequate level of general anesthesia was obtained, her right breast was prepped with ChloraPrep and draped sterile fashion. A timeout was taken to ensure the proper patient and proper procedure. We interrogated the breast with the neoprobe. The seed is located superficially lateral to the nipple.  We made a circumareolar incision around the lateral side of the nipple after infiltrating with 0.25% Marcaine. Dissection was carried down in the breast tissue with cautery. We used the neoprobe to guide [place] towards the radioactive seed. We took the anterior margin of the specimen right off of the back of the dermis.  We excised an area of tissue around the radioactive seed 2 cm in diameter and about 5 cm deep. The specimen was removed and was oriented with a paint kit. Specimen mammogram showed the biopsy clip within the specimen. The seed had become dislodged but was located and sent separately.  This was sent for pathologic examination. There is no residual radioactivity within the biopsy cavity. We inspected carefully for hemostasis. The wound was thoroughly irrigated. The  wound was closed with a deep layer of 3-0 Vicryl and a subcuticular layer of 4-0 Monocryl. Benzoin Steri-Strips were applied. The patient was then extubated and brought to the recovery room in stable condition. All sponge, instrument, and needle counts are correct.  Imogene Burn. Georgette Dover, MD, [facility]  General/ Trauma Surgery  07/15/2017 12:42 PM

## 2017-07-16 ENCOUNTER — Encounter (HOSPITAL_BASED_OUTPATIENT_CLINIC_OR_DEPARTMENT_OTHER): Payer: Self-pay | Admitting: Surgery

## 2017-07-17 ENCOUNTER — Ambulatory Visit: Payer: Self-pay | Admitting: Surgery

## 2017-07-17 DIAGNOSIS — C50911 Malignant neoplasm of unspecified site of right female breast: Secondary | ICD-10-CM

## 2017-07-21 ENCOUNTER — Encounter (HOSPITAL_BASED_OUTPATIENT_CLINIC_OR_DEPARTMENT_OTHER): Payer: Self-pay | Admitting: *Deleted

## 2017-07-23 NOTE — Progress Notes (Signed)
Pt given Ensure and instructed to drink by 0815 day of surgery with teach back method.

## 2017-07-25 ENCOUNTER — Telehealth: Payer: Self-pay | Admitting: Hematology and Oncology

## 2017-07-25 NOTE — Telephone Encounter (Signed)
Spoke with patient regarding appt that was changed per 11/1 message from [facility].

## 2017-07-29 ENCOUNTER — Ambulatory Visit: Payer: Medicare Other | Admitting: Radiation Oncology

## 2017-07-29 ENCOUNTER — Ambulatory Visit: Payer: Medicare Other | Admitting: Hematology and Oncology

## 2017-07-29 ENCOUNTER — Ambulatory Visit (HOSPITAL_BASED_OUTPATIENT_CLINIC_OR_DEPARTMENT_OTHER): Payer: Medicare Other | Admitting: Anesthesiology

## 2017-07-29 ENCOUNTER — Encounter (HOSPITAL_BASED_OUTPATIENT_CLINIC_OR_DEPARTMENT_OTHER)
Admission: RE | Disposition: A | Discharge status: Home / Self Care | Payer: Self-pay | Source: Ambulatory Visit | Attending: Surgery

## 2017-07-29 ENCOUNTER — Other Ambulatory Visit: Payer: Self-pay

## 2017-07-29 ENCOUNTER — Encounter (HOSPITAL_BASED_OUTPATIENT_CLINIC_OR_DEPARTMENT_OTHER): Payer: Self-pay | Admitting: Anesthesiology

## 2017-07-29 ENCOUNTER — Ambulatory Visit (HOSPITAL_COMMUNITY)
Admission: RE | Admit: 2017-07-29 | Discharge: 2017-07-29 | Disposition: A | Discharge status: Home / Self Care | Payer: Medicare Other | Source: Ambulatory Visit | Attending: Surgery | Admitting: Surgery

## 2017-07-29 ENCOUNTER — Ambulatory Visit: Payer: Medicare Other

## 2017-07-29 ENCOUNTER — Ambulatory Visit (HOSPITAL_BASED_OUTPATIENT_CLINIC_OR_DEPARTMENT_OTHER)
Admission: RE | Admit: 2017-07-29 | Discharge: 2017-07-29 | Disposition: A | Discharge status: Home / Self Care | Payer: Medicare Other | Source: Ambulatory Visit | Attending: Surgery | Admitting: Surgery

## 2017-07-29 DIAGNOSIS — Z6832 Body mass index (BMI) 32.0-32.9, adult: Secondary | ICD-10-CM | POA: Diagnosis not present

## 2017-07-29 DIAGNOSIS — Z87891 Personal history of nicotine dependence: Secondary | ICD-10-CM | POA: Insufficient documentation

## 2017-07-29 DIAGNOSIS — M1990 Unspecified osteoarthritis, unspecified site: Secondary | ICD-10-CM | POA: Diagnosis not present

## 2017-07-29 DIAGNOSIS — Z884 Allergy status to anesthetic agent status: Secondary | ICD-10-CM | POA: Insufficient documentation

## 2017-07-29 DIAGNOSIS — Z803 Family history of malignant neoplasm of breast: Secondary | ICD-10-CM | POA: Insufficient documentation

## 2017-07-29 DIAGNOSIS — Z885 Allergy status to narcotic agent status: Secondary | ICD-10-CM | POA: Insufficient documentation

## 2017-07-29 DIAGNOSIS — E669 Obesity, unspecified: Secondary | ICD-10-CM | POA: Insufficient documentation

## 2017-07-29 DIAGNOSIS — I10 Essential (primary) hypertension: Secondary | ICD-10-CM | POA: Diagnosis not present

## 2017-07-29 DIAGNOSIS — K5790 Diverticulosis of intestine, part unspecified, without perforation or abscess without bleeding: Secondary | ICD-10-CM | POA: Diagnosis not present

## 2017-07-29 DIAGNOSIS — E785 Hyperlipidemia, unspecified: Secondary | ICD-10-CM | POA: Diagnosis not present

## 2017-07-29 DIAGNOSIS — C50411 Malignant neoplasm of upper-outer quadrant of right female breast: Secondary | ICD-10-CM | POA: Diagnosis not present

## 2017-07-29 DIAGNOSIS — D0511 Intraductal carcinoma in situ of right breast: Secondary | ICD-10-CM | POA: Diagnosis not present

## 2017-07-29 DIAGNOSIS — Z79899 Other long term (current) drug therapy: Secondary | ICD-10-CM | POA: Insufficient documentation

## 2017-07-29 DIAGNOSIS — Z91040 Latex allergy status: Secondary | ICD-10-CM | POA: Insufficient documentation

## 2017-07-29 DIAGNOSIS — K649 Unspecified hemorrhoids: Secondary | ICD-10-CM | POA: Insufficient documentation

## 2017-07-29 DIAGNOSIS — C50911 Malignant neoplasm of unspecified site of right female breast: Secondary | ICD-10-CM

## 2017-07-29 HISTORY — PX: AXILLARY LYMPH NODE DISSECTION: SHX5229

## 2017-07-29 HISTORY — PX: AXILLARY SENTINEL NODE BIOPSY: SHX5738

## 2017-07-29 SURGERY — BIOPSY, LYMPH NODE, SENTINEL, AXILLARY
Anesthesia: General | Site: Axilla | Laterality: Right

## 2017-07-29 MED ORDER — FENTANYL CITRATE (PF) 100 MCG/2ML IJ SOLN
INTRAMUSCULAR | Status: AC
  Filled 2017-07-29: qty 2

## 2017-07-29 MED ORDER — FAMOTIDINE IN NACL 20-0.9 MG/50ML-% IV SOLN
INTRAVENOUS | Status: AC
  Filled 2017-07-29: qty 50

## 2017-07-29 MED ORDER — ONDANSETRON HCL 4 MG/2ML IJ SOLN
4.0000 mg | Freq: Once | INTRAMUSCULAR | Status: DC | PRN
Start: 2017-07-29 — End: 2017-07-29

## 2017-07-29 MED ORDER — FENTANYL CITRATE (PF) 100 MCG/2ML IJ SOLN
50.0000 ug | INTRAMUSCULAR | Status: DC | PRN
  Administered 2017-07-29: 50 ug via INTRAVENOUS

## 2017-07-29 MED ORDER — LACTATED RINGERS IV SOLN
INTRAVENOUS | Status: DC
  Administered 2017-07-29: 12:00:00 via INTRAVENOUS
  Administered 2017-07-29: 10 mL/h via INTRAVENOUS

## 2017-07-29 MED ORDER — CEFAZOLIN SODIUM-DEXTROSE 2-4 GM/100ML-% IV SOLN
INTRAVENOUS | Status: AC
  Filled 2017-07-29: qty 100

## 2017-07-29 MED ORDER — CHLORHEXIDINE GLUCONATE CLOTH 2 % EX PADS: 6.0000 | MEDICATED_PAD | Freq: Once | CUTANEOUS | Status: DC

## 2017-07-29 MED ORDER — METHYLENE BLUE 0.5 % INJ SOLN
INTRAVENOUS | Status: DC | PRN
  Administered 2017-07-29: 5 mL

## 2017-07-29 MED ORDER — KETOROLAC TROMETHAMINE 30 MG/ML IJ SOLN
INTRAMUSCULAR | Status: AC
  Filled 2017-07-29: qty 1

## 2017-07-29 MED ORDER — MIDAZOLAM HCL 2 MG/2ML IJ SOLN: 1.0000 mg | INTRAMUSCULAR | Status: DC | PRN

## 2017-07-29 MED ORDER — DEXAMETHASONE SODIUM PHOSPHATE 4 MG/ML IJ SOLN
INTRAMUSCULAR | Status: DC | PRN
  Administered 2017-07-29: 10 mg via INTRAVENOUS

## 2017-07-29 MED ORDER — ONDANSETRON HCL 4 MG/2ML IJ SOLN
INTRAMUSCULAR | Status: DC | PRN
  Administered 2017-07-29: 4 mg via INTRAVENOUS

## 2017-07-29 MED ORDER — TECHNETIUM TC 99M SULFUR COLLOID FILTERED
1.0000 | Freq: Once | INTRAVENOUS | Status: AC | PRN
  Administered 2017-07-29: 1 via INTRADERMAL

## 2017-07-29 MED ORDER — BUPIVACAINE-EPINEPHRINE 0.25% -1:200000 IJ SOLN
INTRAMUSCULAR | Status: DC | PRN
  Administered 2017-07-29: 10 mL

## 2017-07-29 MED ORDER — GABAPENTIN 300 MG PO CAPS
300.0000 mg | ORAL_CAPSULE | ORAL | Status: AC
  Administered 2017-07-29: 300 mg via ORAL

## 2017-07-29 MED ORDER — FENTANYL CITRATE (PF) 100 MCG/2ML IJ SOLN
INTRAMUSCULAR | Status: DC | PRN
  Administered 2017-07-29 (×2): 50 ug via INTRAVENOUS

## 2017-07-29 MED ORDER — CEFAZOLIN SODIUM-DEXTROSE 2-4 GM/100ML-% IV SOLN
2.0000 g | INTRAVENOUS | Status: AC
  Administered 2017-07-29: 2 g via INTRAVENOUS

## 2017-07-29 MED ORDER — KETOROLAC TROMETHAMINE 15 MG/ML IJ SOLN
15.0000 mg | Freq: Once | INTRAMUSCULAR | Status: AC
  Administered 2017-07-29: 15 mg via INTRAVENOUS

## 2017-07-29 MED ORDER — MIDAZOLAM HCL 2 MG/2ML IJ SOLN
INTRAMUSCULAR | Status: AC
  Filled 2017-07-29: qty 2

## 2017-07-29 MED ORDER — PROPOFOL 10 MG/ML IV BOLUS
INTRAVENOUS | Status: DC | PRN
  Administered 2017-07-29: 50 mg via INTRAVENOUS
  Administered 2017-07-29: 100 mg via INTRAVENOUS

## 2017-07-29 MED ORDER — PROPOFOL 10 MG/ML IV BOLUS
INTRAVENOUS | Status: AC
  Filled 2017-07-29: qty 20

## 2017-07-29 MED ORDER — FENTANYL CITRATE (PF) 100 MCG/2ML IJ SOLN
25.0000 ug | INTRAMUSCULAR | Status: DC | PRN
  Administered 2017-07-29 (×2): 25 ug via INTRAVENOUS

## 2017-07-29 MED ORDER — ACETAMINOPHEN 500 MG PO TABS
1000.0000 mg | ORAL_TABLET | ORAL | Status: AC
  Administered 2017-07-29: 1000 mg via ORAL

## 2017-07-29 MED ORDER — FAMOTIDINE IN NACL 20-0.9 MG/50ML-% IV SOLN
20.0000 mg | Freq: Once | INTRAVENOUS | Status: AC
  Administered 2017-07-29: 20 mg via INTRAVENOUS

## 2017-07-29 MED ORDER — ACETAMINOPHEN 500 MG PO TABS
ORAL_TABLET | ORAL | Status: AC
  Filled 2017-07-29: qty 2

## 2017-07-29 MED ORDER — SCOPOLAMINE 1 MG/3DAYS TD PT72: 1.0000 | MEDICATED_PATCH | Freq: Once | TRANSDERMAL | Status: DC | PRN

## 2017-07-29 MED ORDER — LIDOCAINE HCL (CARDIAC) 20 MG/ML IV SOLN
INTRAVENOUS | Status: DC | PRN
  Administered 2017-07-29: 30 mg via INTRAVENOUS

## 2017-07-29 MED ORDER — GABAPENTIN 300 MG PO CAPS
ORAL_CAPSULE | ORAL | Status: AC
  Filled 2017-07-29: qty 1

## 2017-07-29 SURGICAL SUPPLY — 56 items
APL SKNCLS STERI-STRIP NONHPOA (GAUZE/BANDAGES/DRESSINGS) ×1
APPLIER CLIP 9.375 MED OPEN (MISCELLANEOUS) ×3
APR CLP MED 9.3 20 MLT OPN (MISCELLANEOUS) ×1
BENZOIN TINCTURE PRP APPL 2/3 (GAUZE/BANDAGES/DRESSINGS) ×3 IMPLANT
BLADE CLIPPER SURG (BLADE) IMPLANT
BLADE HEX COATED 2.75 (ELECTRODE) ×3 IMPLANT
BLADE SURG 15 STRL LF DISP TIS (BLADE) ×1 IMPLANT
BLADE SURG 15 STRL SS (BLADE) ×3
BNDG COHESIVE 3X5 TAN STRL LF (GAUZE/BANDAGES/DRESSINGS) IMPLANT
CANISTER SUCT 1200ML W/VALVE (MISCELLANEOUS) ×3 IMPLANT
CHLORAPREP W/TINT 26ML (MISCELLANEOUS) ×3 IMPLANT
CLIP APPLIE 9.375 MED OPEN (MISCELLANEOUS) IMPLANT
CLOSURE WOUND 1/2 X4 (GAUZE/BANDAGES/DRESSINGS) ×1
COVER BACK TABLE 60X90IN (DRAPES) ×3 IMPLANT
COVER MAYO STAND STRL (DRAPES) ×3 IMPLANT
COVER PROBE W GEL 5X96 (DRAPES) ×3 IMPLANT
DECANTER SPIKE VIAL GLASS SM (MISCELLANEOUS) IMPLANT
DRAPE LAPAROSCOPIC ABDOMINAL (DRAPES) ×3 IMPLANT
DRAPE UTILITY XL STRL (DRAPES) ×3 IMPLANT
DRSG TEGADERM 4X4.75 (GAUZE/BANDAGES/DRESSINGS) ×4 IMPLANT
ELECT REM PT RETURN 9FT ADLT (ELECTROSURGICAL) ×3
ELECTRODE REM PT RTRN 9FT ADLT (ELECTROSURGICAL) ×1 IMPLANT
GAUZE SPONGE 4X4 12PLY STRL LF (GAUZE/BANDAGES/DRESSINGS) ×3 IMPLANT
GLOVE BIO SURGEON STRL SZ 6.5 (GLOVE) ×1 IMPLANT
GLOVE BIO SURGEON STRL SZ7 (GLOVE) ×3 IMPLANT
GLOVE BIO SURGEONS STRL SZ 6.5 (GLOVE) ×1
GLOVE BIOGEL PI IND STRL 7.5 (GLOVE) ×1 IMPLANT
GLOVE BIOGEL PI INDICATOR 7.5 (GLOVE) ×2
GLOVE SURG SS PI 6.5 STRL IVOR (GLOVE) ×2 IMPLANT
GOWN STRL REUS W/ TWL LRG LVL3 (GOWN DISPOSABLE) ×2 IMPLANT
GOWN STRL REUS W/TWL LRG LVL3 (GOWN DISPOSABLE) ×9
ILLUMINATOR WAVEGUIDE N/F (MISCELLANEOUS) IMPLANT
LIGHT WAVEGUIDE WIDE FLAT (MISCELLANEOUS) IMPLANT
NDL HYPO 25X1 1.5 SAFETY (NEEDLE) ×2 IMPLANT
NDL SAFETY ECLIPSE 18X1.5 (NEEDLE) ×1 IMPLANT
NEEDLE HYPO 18GX1.5 SHARP (NEEDLE) ×3
NEEDLE HYPO 25X1 1.5 SAFETY (NEEDLE) ×6 IMPLANT
NS IRRIG 1000ML POUR BTL (IV SOLUTION) ×3 IMPLANT
PACK BASIN DAY SURGERY FS (CUSTOM PROCEDURE TRAY) ×3 IMPLANT
PENCIL BUTTON HOLSTER BLD 10FT (ELECTRODE) ×3 IMPLANT
SHEET MEDIUM DRAPE 40X70 STRL (DRAPES) ×2 IMPLANT
SLEEVE SCD COMPRESS KNEE MED (MISCELLANEOUS) ×3 IMPLANT
SPONGE LAP 18X18 X RAY DECT (DISPOSABLE) IMPLANT
SPONGE LAP 4X18 X RAY DECT (DISPOSABLE) ×3 IMPLANT
STOCKINETTE IMPERVIOUS LG (DRAPES) IMPLANT
STRIP CLOSURE SKIN 1/2X4 (GAUZE/BANDAGES/DRESSINGS) ×2 IMPLANT
SUT MON AB 4-0 PC3 18 (SUTURE) ×3 IMPLANT
SUT VIC AB 3-0 SH 27 (SUTURE) ×3
SUT VIC AB 3-0 SH 27X BRD (SUTURE) ×1 IMPLANT
SYR BULB 3OZ (MISCELLANEOUS) ×2 IMPLANT
SYR CONTROL 10ML LL (SYRINGE) ×6 IMPLANT
TOWEL OR 17X24 6PK STRL BLUE (TOWEL DISPOSABLE) ×3 IMPLANT
TOWEL OR NON WOVEN STRL DISP B (DISPOSABLE) ×1 IMPLANT
TUBE CONNECTING 20'X1/4 (TUBING) ×1
TUBE CONNECTING 20X1/4 (TUBING) ×2 IMPLANT
YANKAUER SUCT BULB TIP NO VENT (SUCTIONS) ×3 IMPLANT

## 2017-07-29 NOTE — Discharge Instructions (Signed)
May take ibuprofen at 8pm this evening if needed.   [place] Office Phone Number [phone]  POST OP INSTRUCTIONS  Always review your discharge instruction sheet given to you by the facility where your surgery was performed.  IF YOU HAVE DISABILITY OR FAMILY LEAVE FORMS, YOU MUST BRING THEM TO THE OFFICE FOR PROCESSING.  DO NOT GIVE THEM TO YOUR DOCTOR.  1. You may take acetaminophen (Tylenol) or ibuprofen (Advil) as needed. 2. Take your usually prescribed medications unless otherwise directed 3. You should eat very light the first 24 hours after surgery, such as soup, crackers, pudding, etc.  Resume your normal diet the day after surgery. 4. Most patients will experience some swelling and bruising in the axilla.  Ice packs will help.  Swelling and bruising can take several days to resolve.  5. It is common to experience some constipation if taking pain medication after surgery.  Increasing fluid intake and taking a stool softener will usually help or prevent this problem from occurring.  A mild laxative (Milk of Magnesia or Miralax) should be taken according to package directions if there are no bowel movements after 48 hours. 6. Unless discharge instructions indicate otherwise, you may remove your bandages 24-48 hours after surgery, and you may shower at that time.  You may have steri-strips (small skin tapes) in place directly over the incision.  These strips should be left on the skin for 7-10 days.   7. ACTIVITIES:  You may resume regular daily activities (gradually increasing) beginning the next day.  Wearing a good support bra or sports bra minimizes pain and swelling.  You may have sexual intercourse when it is comfortable. a. You may drive when you no longer are taking prescription pain medication, you can comfortably wear a seatbelt, and you can safely maneuver your car and apply brakes. b. RETURN TO WORK:   ______________________________________________________________________________________ 8. You should see your doctor in the office for a follow-up appointment approximately two weeks after your surgery.  Your doctors nurse will typically make your follow-up appointment when she calls you with your pathology report.  Expect your pathology report 2-3 business days after your surgery.  You may call to check if you do not hear from [place] after three days. 9. OTHER INSTRUCTIONS: _______________________________________________________________________________________________ _____________________________________________________________________________________________________________________________________ _____________________________________________________________________________________________________________________________________ _____________________________________________________________________________________________________________________________________  WHEN TO CALL YOUR DOCTOR: 1. Fever over 101.0 2. Nausea and/or vomiting. 3. Extreme swelling or bruising. 4. Continued bleeding from incision. 5. Increased pain, redness, or drainage from the incision.  The clinic staff is available to answer your questions during regular business hours.  Please dont hesitate to call and ask to speak to one of the nurses for clinical concerns.  If you have a medical emergency, go to the nearest emergency room or call 911.  A surgeon from [facility] is always on call at the hospital.  For further questions, please visit [email or web address]    Post Anesthesia Home Care Instructions  Activity: Get plenty of rest for the remainder of the day. A responsible individual must stay with you for 24 hours following the procedure.  For the next 24 hours, DO NOT: -Drive a car -Paediatric nurse -Drink alcoholic beverages -Take any medication unless instructed by your physician -Make any  legal decisions or sign important papers.  Meals: Start with liquid foods such as gelatin or soup. Progress to regular foods as tolerated. Avoid greasy, spicy, heavy foods. If nausea and/or vomiting occur, drink only clear liquids until the nausea and/or vomiting subsides. Call  your physician if vomiting continues.  Special Instructions/Symptoms: Your throat may feel dry or sore from the anesthesia or the breathing tube placed in your throat during surgery. If this causes discomfort, gargle with warm salt water. The discomfort should disappear within 24 hours.  If you had a scopolamine patch placed behind your ear for the management of post- operative nausea and/or vomiting:  1. The medication in the patch is effective for 72 hours, after which it should be removed.  Wrap patch in a tissue and discard in the trash. Wash hands thoroughly with soap and water. 2. You may remove the patch earlier than 72 hours if you experience unpleasant side effects which may include dry mouth, dizziness or visual disturbances. 3. Avoid touching the patch. Wash your hands with soap and water after contact with the patch.

## 2017-07-29 NOTE — Transfer of Care (Signed)
Immediate Anesthesia Transfer of Care Note  Patient: Cheyenne Lee  Procedure(s) Performed: RIGHT AXILLARY SENTINEL NODE BIOPSY (Right Axilla) AXILLARY LYMPH NODE DISSECTION (Right Axilla)  Patient Location: PACU  Anesthesia Type:General  Level of Consciousness: awake and patient cooperative  Airway & Oxygen Therapy: Patient Spontanous Breathing and Patient connected to face mask oxygen  Post-op Assessment: Report given to RN and Post -op Vital signs reviewed and stable  Post vital signs: Reviewed and stable  Last Vitals:  Vitals:   07/29/17 1115 07/29/17 1130  BP: (!) 164/101 (!) 173/115  Pulse: 94 92  Resp: 17 (!) 27  Temp:    SpO2: 100% 97%    Last Pain:  Vitals:   07/29/17 1051  TempSrc: Oral  PainSc: 0-No pain         Complications: No apparent anesthesia complications

## 2017-07-29 NOTE — Anesthesia Procedure Notes (Signed)
Procedure Name: LMA Insertion Date/Time: 07/29/2017 11:50 AM Performed by: Marrianne Mood, CRNA Pre-anesthesia Checklist: Patient identified, Emergency Drugs available, Suction available, Patient being monitored and Timeout performed Patient Re-evaluated:Patient Re-evaluated prior to induction Oxygen Delivery Method: Circle system utilized Preoxygenation: Pre-oxygenation with 100% oxygen Induction Type: IV induction Ventilation: Mask ventilation without difficulty LMA: LMA inserted LMA Size: 4.0 Number of attempts: 1 Airway Equipment and Method: Bite block Placement Confirmation: positive ETCO2 Tube secured with: Tape Dental Injury: Teeth and Oropharynx as per pre-operative assessment

## 2017-07-29 NOTE — Anesthesia Postprocedure Evaluation (Signed)
Anesthesia Post Note  Patient: Agatha Duplechain  Procedure(s) Performed: RIGHT AXILLARY SENTINEL NODE BIOPSY (Right Axilla) AXILLARY LYMPH NODE DISSECTION (Right Axilla)     Patient location during evaluation: PACU Anesthesia Type: General Level of consciousness: awake and alert Pain management: pain level controlled Vital Signs Assessment: post-procedure vital signs reviewed and stable Respiratory status: spontaneous breathing, nonlabored ventilation, respiratory function stable and patient connected to nasal cannula oxygen Cardiovascular status: blood pressure returned to baseline and stable Postop Assessment: no apparent nausea or vomiting Anesthetic complications: no    Last Vitals:  Vitals:   07/29/17 1403 07/29/17 1423  BP:  (!) 142/69  Pulse: 66 70  Resp: 14 18  Temp:  36.9 C  SpO2: 97% 97%    Last Pain:  Vitals:   07/29/17 1423  TempSrc: Oral  PainSc: 3                  Wilbert Schouten P Miho Monda

## 2017-07-29 NOTE — Anesthesia Preprocedure Evaluation (Addendum)
Anesthesia Evaluation  Patient identified by MRN, date of birth, ID band Patient awake    Reviewed: Allergy & Precautions, NPO status , Patient's Chart, lab work & pertinent test results  History of Anesthesia Complications (+) PONV and history of anesthetic complications  Airway Mallampati: II  TM Distance: >3 FB Neck ROM: Full    Dental  (+) Dental Advisory Given   Pulmonary neg pulmonary ROS,    breath sounds clear to auscultation       Cardiovascular hypertension, Pt. on medications  Rhythm:Regular Rate:Normal     Neuro/Psych negative neurological ROS  negative psych ROS   GI/Hepatic negative GI ROS, Neg liver ROS,   Endo/Other  negative endocrine ROS  Renal/GU negative Renal ROS     Musculoskeletal  (+) Arthritis ,   Abdominal (+) + obese,   Peds  Hematology negative hematology ROS (+)   Anesthesia Other Findings  RIGHT BREAST IDC AND DCIS  Reproductive/Obstetrics                           No results found for: WBC, HGB, HCT, MCV, PLT Lab Results  Component Value Date   CREATININE 0.93 07/10/2017   BUN 15 07/10/2017   NA 137 07/10/2017   K 4.0 07/10/2017   CL 102 07/10/2017   CO2 24 07/10/2017    Anesthesia Physical  Anesthesia Plan  ASA: II  Anesthesia Plan: General   Post-op Pain Management:    Induction: Intravenous  PONV Risk Score and Plan: 4 or greater and Ondansetron, Dexamethasone, Midazolam and Treatment may vary due to age or medical condition  Airway Management Planned: LMA  Additional Equipment:   Intra-op Plan:   Post-operative Plan: Extubation in OR  Informed Consent: I have reviewed the patients History and Physical, chart, labs and discussed the procedure including the risks, benefits and alternatives for the proposed anesthesia with the patient or authorized representative who has indicated his/her understanding and acceptance.   Dental  advisory given  Plan Discussed with: CRNA  Anesthesia Plan Comments:        Anesthesia Quick Evaluation

## 2017-07-29 NOTE — Op Note (Signed)
Preop diagnosis: Invasive ductal carcinoma and DCIS of the right breast Postop diagnosis: Same Procedure performed: Attempted right axillary sentinel lymph node biopsy/limited right axillary lymph node dissection Blue dye injection Surgeon:Deeanne Deininger K. Anesthesia: General via LMA Indications: This is a 76 year old female who recently had an abnormal mammogram that led to core biopsy.  This showed a diagnosis of DCIS.  On 07/15/17 she underwent right radioactive seed localized lumpectomy.  This was in the upper outer quadrant of the right breast.  Unfortunately the pathology report showed a small focus of invasive ductal carcinoma as well as DCIS.  Margins were clear.  She presents now for sentinel lymph node biopsy.  She was injected in the preoperative area with technetium sulfur colloid.  Description of procedure: The patient is brought to the operating room and placed in supine position on the operating room table.  After adequate level of general anesthesia was obtained, we took a timeout and then I injected methylene blue dye around her nipple.  Her entire right breast and axilla were prepped with ChloraPrep and draped in sterile fashion.  Another timeout was taken.  I interrogated her axilla with the neoprobe.  There was minimal activity in the axilla with counts less than 10.  Counts around the injection sites were over 1200.  I made a transverse incision across her axilla.  We dissected down through the subcutaneous tissues into the axillary contents.  I again interrogated with the neoprobe.  There was minimal activity.  I did not see any traces of blue dye.  It is likely that the lymphatic pathways were disrupted by the previous lumpectomy.  I then performed a limited lymph node dissection by palpating for some lymph nodes within the axilla just below the edge of the pectoralis.  I excised some fatty tissue that appeared to contain some lymph nodes.  We sent this for pathologic examination.   Gross examination confirmed that there were at least 2 lymph nodes within these contents.  While we were waiting, I continue to interrogating the axilla with the neoprobe.  There was an area that had some activity up to about 11.  I palpated this area and there were 2 adjacent lymph nodes.  We excised these.  There was no blue dye in this area.  We sent these a sentinel lymph nodes #1 and 2.  There is 0 background activity.  We irrigated the wound thoroughly and inspected for hemostasis.  The wound was closed with 3-0 Vicryl and a subcuticular layer of 4-0 Monocryl.  Benzoin Steri-Strips were applied.  The patient was then extubated and brought to the recovery room in stable condition.  All sponge, instrument, and needle counts are correct.  Imogene Burn. Georgette Dover, MD, [facility]  General/ Trauma Surgery  07/29/2017 12:51 PM

## 2017-07-29 NOTE — Interval H&P Note (Signed)
History and Physical Interval Note:  07/29/2017 10:28 AM  She is s/p right lumpectomy on 10/23, which revealed invasive ductal carcinoma as well as DCIS.  Margins are clear.  She presents now for sentinel lymph node biopsy.  Cheyenne Lee  has presented today for surgery, with the diagnosis of RIGHT BREAST IDC AND DCIS  The various methods of treatment have been discussed with the patient and family. After consideration of risks, benefits and other options for treatment, the patient has consented to  Procedure(s): [place] (Right) as a surgical intervention .  The patient's history has been reviewed, patient examined, no change in status, stable for surgery.  I have reviewed the patient's chart and labs.  Questions were answered to the patient's satisfaction.     [name]

## 2017-07-30 ENCOUNTER — Encounter (HOSPITAL_BASED_OUTPATIENT_CLINIC_OR_DEPARTMENT_OTHER): Payer: Self-pay | Admitting: Surgery

## 2017-07-31 ENCOUNTER — Encounter: Payer: Self-pay | Admitting: Radiation Oncology

## 2017-07-31 NOTE — Progress Notes (Signed)
Location of Breast Cancer: Right Breast 10 o'clock position ,Upper Outer Quadrant  Histology per Pathology Report: Diagnosis10/11/2016:Breast, right, needle core biopsy, upper outer quadrant - LOW GRADE DUCTAL CARCINOMA IN SITU. SEE MICROSCOPIC DESCRIPTION.  Receptor Status: ER(100%+) PR (100%+), Her2-neu (), Ki-()  Did patient present with symptoms (if so, please note symptoms) or was this found on screening mammography?: Routine mammogram   Past/Anticipated interventions by surgeon, if OHK:GOVPCHEKB 07/29/2017: Imogene Burn. Tsuei, MD, , follow up   08/11/17  1. Lymph nodes, regional resection, Right Axillary Contents - THERE IS NO EVIDENCE OF CARCINOMA IN 2 OF 2 LYMPH NODES (0/2). 2. Lymph node, sentinel, biopsy, right - THERE IS NO EVIDENCE OF CARCINOMA IN 1 OF 1 LYMPH NODE (0/1). 3. Lymph node, sentinel, biopsy, right - THERE IS NO EVIDENCE OF CARCINOMA IN 1 OF 1 LYMPH NODE (0/1). 4. Lymph node, sentinel, biopsy, right - THERE IS NO EVIDENCE OF CARCINOMA IN 1 OF 1 LYMPH NODE (0/1). 5. Lymph node, sentinel, biopsy, right - THERE IS NO EVIDENCE OF CARCINOMA IN 1 OF 1 LYMPH NODE (0/1). 6. Lymph node, sentinel, biopsy, right - THERE IS NO EVIDENCE OF CARCINOMA IN 1 OF 1 LYMPH NODE (0/1). Diagnosis 07/15/2017: Breast, lumpectomy, Right - INVASIVE AND IN SITU DUCTAL CARCINOMA. - MARGINS NOT INVOLVED.- FIBROCYSTIC CHANGES WITH CALCIFICATIONS.- INVASIVE CARCINOMA 0.3 CM FROM LATERAL MARGIN.- DUCTAL CARCINOMA IN SITU 0.1 CM FROM LATERAL MARGIN.- PREVIOUS BIOPSY SITE AND CLIP.  Past/Anticipated interventions by medical oncology, if any: Chemotherapy Dr. Lindi Adie seen today 08/12/17  Lymphedema issues, if any:  NO  Pain issues, if any:  Twinges in right breast, incisions well healed  SAFETY ISSUES: NO  Prior radiation? No  Pacemaker/ICD? NO  Is the patient on methotrexate? NO  Current Complaints / other details:  Menarche age 16, 40st pregnancy age 11, G4P4,C-sections, , no HRT,Hx  fibroadenomas excised from both sides. Former cigarette smoker, occasional alcohol use, no drug use, HTN,  Paternal grandmother breast cancer, sister ductal carcinoma stage II  Allergies: Codeine Phosphate,Latex,Anesthesia S/1-60=Vomiting general anesthetics BP (!) 157/79 Comment: LFA  Pulse 75   Temp 98.2 F (36.8 C) (Oral)   Resp 20   Ht _0  (1.626 m)   Wt 194 lb 3.2 oz (88.1 kg)   BMI 33.33 kg/m   Wt Readings from Last 3 Encounters:  08/12/17 194 lb 3.2 oz (88.1 kg)  08/12/17 194 lb 14.4 oz (88.4 kg)  07/29/17 191 lb (86.6 kg)      Cheyenne Eaton, RN 07/31/2017,8:21 AM

## 2017-08-07 DIAGNOSIS — H16223 Keratoconjunctivitis sicca, not specified as Sjogren's, bilateral: Secondary | ICD-10-CM | POA: Diagnosis not present

## 2017-08-07 DIAGNOSIS — H401211 Low-tension glaucoma, right eye, mild stage: Secondary | ICD-10-CM | POA: Diagnosis not present

## 2017-08-07 DIAGNOSIS — H401223 Low-tension glaucoma, left eye, severe stage: Secondary | ICD-10-CM | POA: Insufficient documentation

## 2017-08-12 ENCOUNTER — Ambulatory Visit
Admission: RE | Admit: 2017-08-12 | Discharge: 2017-08-12 | Disposition: A | Discharge status: Home / Self Care | Payer: Medicare Other | Source: Ambulatory Visit | Attending: Radiation Oncology | Admitting: Radiation Oncology

## 2017-08-12 ENCOUNTER — Telehealth: Payer: Self-pay | Admitting: *Deleted

## 2017-08-12 ENCOUNTER — Ambulatory Visit (HOSPITAL_BASED_OUTPATIENT_CLINIC_OR_DEPARTMENT_OTHER): Payer: Medicare Other | Admitting: Hematology and Oncology

## 2017-08-12 ENCOUNTER — Encounter: Payer: Self-pay | Admitting: *Deleted

## 2017-08-12 ENCOUNTER — Encounter: Payer: Self-pay | Admitting: Radiation Oncology

## 2017-08-12 VITALS — BP 157/79 | HR 75 | Temp 98.2°F | Resp 20 | Ht 64.0 in | Wt 194.2 lb

## 2017-08-12 DIAGNOSIS — H409 Unspecified glaucoma: Secondary | ICD-10-CM | POA: Insufficient documentation

## 2017-08-12 DIAGNOSIS — C50411 Malignant neoplasm of upper-outer quadrant of right female breast: Secondary | ICD-10-CM | POA: Diagnosis not present

## 2017-08-12 DIAGNOSIS — Z803 Family history of malignant neoplasm of breast: Secondary | ICD-10-CM | POA: Diagnosis not present

## 2017-08-12 DIAGNOSIS — I10 Essential (primary) hypertension: Secondary | ICD-10-CM | POA: Insufficient documentation

## 2017-08-12 DIAGNOSIS — K589 Irritable bowel syndrome without diarrhea: Secondary | ICD-10-CM | POA: Diagnosis not present

## 2017-08-12 DIAGNOSIS — Z170 Estrogen receptor positive status [ER+]: Secondary | ICD-10-CM

## 2017-08-12 DIAGNOSIS — Z807 Family history of other malignant neoplasms of lymphoid, hematopoietic and related tissues: Secondary | ICD-10-CM | POA: Diagnosis not present

## 2017-08-12 DIAGNOSIS — Z800 Family history of malignant neoplasm of digestive organs: Secondary | ICD-10-CM | POA: Diagnosis not present

## 2017-08-12 DIAGNOSIS — Z885 Allergy status to narcotic agent status: Secondary | ICD-10-CM | POA: Insufficient documentation

## 2017-08-12 DIAGNOSIS — Z79899 Other long term (current) drug therapy: Secondary | ICD-10-CM | POA: Insufficient documentation

## 2017-08-12 DIAGNOSIS — Z9889 Other specified postprocedural states: Secondary | ICD-10-CM | POA: Diagnosis not present

## 2017-08-12 DIAGNOSIS — M160 Bilateral primary osteoarthritis of hip: Secondary | ICD-10-CM | POA: Insufficient documentation

## 2017-08-12 DIAGNOSIS — Z510 Encounter for antineoplastic radiation therapy: Secondary | ICD-10-CM | POA: Diagnosis not present

## 2017-08-12 DIAGNOSIS — Z801 Family history of malignant neoplasm of trachea, bronchus and lung: Secondary | ICD-10-CM | POA: Diagnosis not present

## 2017-08-12 DIAGNOSIS — Z809 Family history of malignant neoplasm, unspecified: Secondary | ICD-10-CM | POA: Diagnosis not present

## 2017-08-12 HISTORY — DX: Malignant neoplasm of unspecified site of unspecified female breast: C50.919

## 2017-08-12 NOTE — Telephone Encounter (Signed)
Received order for oncotype testing. Requisition sent to pathology. Received by Keisha 

## 2017-08-12 NOTE — Progress Notes (Signed)
[facility] CONSULT NOTE  Patient Care Team: Glenford Bayley, DO as PCP - General (Family Medicine)  CHIEF COMPLAINTS/PURPOSE OF CONSULTATION:  Newly diagnosed breast cancer  HISTORY OF PRESENTING ILLNESS:  Cheyenne Lee 76 y.o. female is here because of recent diagnosis of right breast cancer.  Patient had a routine screening mammogram that detected an abnormality in the right breast.  This was further evaluated by additional mammograms and ultrasound.  She finally underwent ultrasound-guided biopsy which came back as DCIS that was ER PR positive.  She was seen by Dr.Tsui who performed a lumpectomy.  In the lumpectomy specimen she was noted to have a small focus of invasive ductal carcinoma.  She was then taken to the OR again and she had sentinel lymph node study performed.  7 lymph nodes were removed and they were all negative for cancer.  The tumor was ER PR positive HER-2 negative with a Ki-67 of 10% and it was grade 2.  She was sent to [place] for discussion regarding adjuvant treatment options.  I reviewed her records extensively and collaborated the history with the patient.  SUMMARY OF ONCOLOGIC HISTORY:   Malignant neoplasm of upper-outer quadrant of right breast in female, estrogen receptor positive ([place])   07/15/2017 Surgery    Right lumpectomy: Grade 2 IDC with DCIS, margins negative, invasive tumor 0.8 cm the remainder was DCIS, 0/7 lymph nodes negative, ER 100%, PR 95%, Ki-67 10%, HER-2 negative ratio 1.43, T1b N0 stage I a      MEDICAL HISTORY:  Past Medical History:  Diagnosis Date  . Arthritis    hips  . Breast cancer ([place]) 06/25/2017   Right breast  . Cancer ([place])    right breast DCIS  . Complication of anesthesia   . Family history of malignant neoplasm of breast   . Glaucoma   . H/O seasonal allergies   . Hypertension   . IBS (irritable bowel syndrome)   . PONV (postoperative nausea and vomiting)     SURGICAL HISTORY: Past Surgical History:   Procedure Laterality Date  . AXILLARY LYMPH NODE DISSECTION Right 07/29/2017   Performed by Donnie Mesa, MD at [facility]  . BREAST BIOPSY     Multiple Biopsies, both breasts  . CESAREAN SECTION     x4  . RIGHT AXILLARY SENTINEL NODE BIOPSY Right 07/29/2017   Performed by Donnie Mesa, MD at [facility]  . RIGHT BREAST LUMPECTOMY WITH RADIOACTIVE SEED LOCALIZATION ERAS PATHWAY Right 07/15/2017   Performed by Donnie Mesa, MD at [place]: Social History   Socioeconomic History  . Marital status: Married    Spouse name: Not on file  . Number of children: Not on file  . Years of education: Not on file  . Highest education level: Not on file  Social Needs  . Financial resource strain: Not on file  . Food insecurity - worry: Not on file  . Food insecurity - inability: Not on file  . Transportation needs - medical: Not on file  . Transportation needs - non-medical: Not on file  Occupational History  . Not on file  Tobacco Use  . Smoking status: Never Smoker  . Smokeless tobacco: Never Used  Substance and Sexual Activity  . Alcohol use: Yes    Comment: social  . Drug use: No  . Sexual activity: Not on file  Other Topics Concern  . Not on file  Social History  Narrative  . Not on file    FAMILY HISTORY: Family History  Problem Relation Age of Onset  . Cancer Father 67       unk. primary; deceased 17  . Breast cancer Sister 51       currently 82; history of NHL  . Lung cancer Maternal Aunt        deceased 15; smoker  . Lung cancer Maternal Uncle        2 uncles; smokers; deceased  . Breast cancer Paternal Aunt 91       deceased 80s  . Cancer Paternal Uncle        unk. primary; deceased late 63s  . Breast cancer Paternal Grandmother        deceased 68  . Lung cancer Paternal Grandfather        deceased 33; smoker  . Breast cancer Paternal Aunt 30       deceased 46s  . Breast cancer Cousin         pat cousin; daughter of an aunt w/ BC; dx 41s; deceased 56  . Cancer Maternal Aunt        unk. primary; deceased 19s  . Cancer Maternal Uncle        GI cancer; deceased 50s  . Cancer Other 74       lymphoma in niece; daughter of her brother; deceased    ALLERGIES:  is allergic to codeine and tape.  MEDICATIONS:  Current Outpatient Medications  Medication Sig Dispense Refill  . dicyclomine (BENTYL) 10 MG capsule Take 10 mg by mouth 4 (four) times daily -  before meals and at bedtime.    . fexofenadine (ALLEGRA) 180 MG tablet Take 180 mg by mouth daily.    Marland Kitchen ibuprofen (ADVIL,MOTRIN) 200 MG tablet Take 200 mg by mouth every 6 (six) hours as needed.    . latanoprost (XALATAN) 0.005 % ophthalmic solution 1 drop at bedtime.    . traMADol (ULTRAM) 50 MG tablet Take 1 tablet (50 mg total) by mouth every 6 (six) hours as needed for moderate pain or severe pain. (Patient not taking: Reported on 07/29/2017) 12 tablet 0  . triamterene-hydrochlorothiazide (DYAZIDE) 37.5-25 MG capsule Take 1 capsule by mouth daily.     No current facility-administered medications for this visit.     REVIEW OF SYSTEMS:   Constitutional: Denies fevers, chills or abnormal night sweats Eyes: Denies blurriness of vision, double vision or watery eyes Ears, nose, mouth, throat, and face: Denies mucositis or sore throat Respiratory: Denies cough, dyspnea or wheezes Cardiovascular: Denies palpitation, chest discomfort or lower extremity swelling Gastrointestinal:  Denies nausea, heartburn or change in bowel habits Skin: Denies abnormal skin rashes Lymphatics: Denies new lymphadenopathy or easy bruising Neurological:Denies numbness, tingling or new weaknesses Behavioral/Psych: Mood is stable, no new changes  Breast: Recent lumpectomy and recent axillary lymph node surgery All other systems were reviewed with the patient and are negative.  PHYSICAL EXAMINATION: ECOG PERFORMANCE STATUS: 1 - Symptomatic but completely  ambulatory  Vitals:   08/12/17 0917  BP: (!) 146/54  Pulse: 74  Resp: 18  Temp: 99.1 F (37.3 C)  SpO2: 98%   Filed Weights   08/12/17 0917  Weight: 194 lb 14.4 oz (88.4 kg)    GENERAL:alert, no distress and comfortable SKIN: skin color, texture, turgor are normal, no rashes or significant lesions EYES: normal, conjunctiva are pink and non-injected, sclera clear OROPHARYNX:no exudate, no erythema and lips, buccal mucosa, and tongue normal  NECK: supple,  thyroid normal size, non-tender, without nodularity LYMPH:  no palpable lymphadenopathy in the cervical, axillary or inguinal LUNGS: clear to auscultation and percussion with normal breathing effort HEART: regular rate & rhythm and no murmurs and no lower extremity edema ABDOMEN:abdomen soft, non-tender and normal bowel sounds Musculoskeletal:no cyanosis of digits and no clubbing  PSYCH: alert & oriented x 3 with fluent speech NEURO: no focal motor/sensory deficits  LABORATORY DATA:  I have reviewed the data as listed No results found for: WBC, HGB, HCT, MCV, PLT Lab Results  Component Value Date   NA 137 07/10/2017   K 4.0 07/10/2017   CL 102 07/10/2017   CO2 24 07/10/2017    RADIOGRAPHIC STUDIES: I have personally reviewed the radiological reports and agreed with the findings in the report.  ASSESSMENT AND PLAN:  Malignant neoplasm of upper-outer quadrant of right breast in female, estrogen receptor positive ([place]) Right lumpectomy: Grade 2 IDC with DCIS, margins negative, invasive tumor 0.8 cm the remainder was DCIS, 0/7 lymph nodes negative, ER 100%, PR 95%, Ki-67 10%, HER-2 negative ratio 1.43, T1b N0 stage I a  Pathology and radiology counseling: Discussed with the patient, the details of pathology including the type of breast cancer,the clinical staging, the significance of ER, PR and HER-2/neu receptors and the implications for treatment. After reviewing the pathology in detail, we proceeded to discuss the  different treatment options between radiation, chemotherapy, antiestrogen therapies.  Given her advanced age and favorable characteristics of the cancer, I did not recommend systemic chemotherapy.  I did not recommend Oncotype testing either.  Recommendation: 1.  Oncotype DX to determine if she would benefit from systemic chemotherapy 2.  Adjuvant radiation therapy followed by 3. adjuvant antiestrogen therapy with anastrozole 1 mg daily times 5 years  Oncotype counseling: I discussed Oncotype DX test. I explained to the patient that this is a 21 gene panel to evaluate patient tumors DNA to calculate recurrence score. This would help determine whether patient has high risk or intermediate risk or low risk breast cancer. She understands that if her tumor was found to be high risk, she would benefit from systemic chemotherapy. If low risk, no need of chemotherapy. If she was found to be intermediate risk, we would need to evaluate the score as well as other risk factors and determine if an abbreviated chemotherapy may be of benefit.  Return to clinic depending on Oncotype DX test result    All questions were answered. The patient knows to call the clinic with any problems, questions or concerns.    Rulon Eisenmenger, MD 08/12/17

## 2017-08-12 NOTE — Progress Notes (Signed)
Please see the Nurse Progress Note in the MD Initial Consult Encounter for this patient. 

## 2017-08-12 NOTE — Progress Notes (Signed)
Radiation Oncology         [phone]) [phone] ________________________________  Name: Cheyenne Lee        MRN: [id number]  Date of Service: 08/12/2017 DOB: 27-Jan-1941  CC:Le, Thao P, DO  Donnie Mesa, MD     REFERRING PHYSICIAN: Donnie Mesa, MD   DIAGNOSIS: The encounter diagnosis was Malignant neoplasm of upper-outer quadrant of right breast in female, estrogen receptor positive ([place]).   HISTORY OF PRESENT ILLNESS: Cheyenne Lee is a 76 y.o. female seen at the request of Dr. Georgette Dover. The patient presented for routine screening mammogram on 06/17/2017 which showed a possible right breast mass with calcifications. Accordingly a diagnostic mammogram and right breast ultrasound were performed on 06/23/2017. These scans showed a complex lytic lesion in the right breast at 10 o'clock position 3 cm from the nipple, measuring 1.2 x 0.5 x 0.8 cm. Biopsy of the right breast upper outer quadrant revealed low grade DCIS, ER 100%, PR 100%.   The patient underwent a right breast lumpectomy on 07/15/2017 with Dr. Georgette Dover. Final pathology showed invasive and in situ ductal carcinoma, ER 100%, PR 95%, HER-2 negative, Ki-67 10%. Margins were not involved. There were fibrocystic changes with calcifications. Invasive carcinoma 0.3 cm from lateral margin and DCIS 0.1 cm from lateral margin. She underwent sentinal lymph node biopsy on 07/29/2017 none of the 7 sampled nodes showed evidence of carcinoma. She met with Dr. Lindi Adie and has plans to have oncotype testing on her tumor. She comes today to discuss options of radiotherapy.   PREVIOUS RADIATION THERAPY: No   PAST MEDICAL HISTORY:  Past Medical History:  Diagnosis Date  . Arthritis    hips  . Breast cancer ([place]) 06/25/2017   Right breast  . Cancer ([place])    right breast DCIS  . Complication of anesthesia   . Family history of malignant neoplasm of breast   . Glaucoma   . H/O seasonal allergies   . Hypertension   . IBS (irritable bowel syndrome)   .  PONV (postoperative nausea and vomiting)        PAST SURGICAL HISTORY: Past Surgical History:  Procedure Laterality Date  . AXILLARY LYMPH NODE DISSECTION Right 07/29/2017   Performed by Donnie Mesa, MD at [facility]  . BREAST BIOPSY     Multiple Biopsies, both breasts  . CESAREAN SECTION     x4  . RIGHT AXILLARY SENTINEL NODE BIOPSY Right 07/29/2017   Performed by Donnie Mesa, MD at [facility]  . RIGHT BREAST LUMPECTOMY WITH RADIOACTIVE SEED LOCALIZATION ERAS PATHWAY Right 07/15/2017   Performed by Donnie Mesa, MD at [place]:  Family History  Problem Relation Age of Onset  . Cancer Father 71       unk. primary; deceased 35  . Breast cancer Sister 74       currently 75; history of NHL  . Lung cancer Maternal Aunt        deceased 72; smoker  . Lung cancer Maternal Uncle        2 uncles; smokers; deceased  . Breast cancer Paternal Aunt 38       deceased 48s  . Cancer Paternal Uncle        unk. primary; deceased late 68s  . Breast cancer Paternal Grandmother        deceased 71  . Lung cancer Paternal Grandfather        deceased 62; smoker  .  Breast cancer Paternal Aunt 46       deceased 66s  . Breast cancer Cousin        pat cousin; daughter of an aunt w/ BC; dx 81s; deceased 13  . Cancer Maternal Aunt        unk. primary; deceased 50s  . Cancer Maternal Uncle        GI cancer; deceased 69s  . Cancer Other 25       lymphoma in niece; daughter of her brother; deceased     SOCIAL HISTORY:  reports that  has never smoked. she has never used smokeless tobacco. She reports that she drinks alcohol. She reports that she does not use drugs. The patient is married and lives in [place].  She is originally from [place].   ALLERGIES: Codeine and Tape   MEDICATIONS:  Current Outpatient Medications  Medication Sig Dispense Refill  . dicyclomine (BENTYL) 10 MG capsule Take 10 mg by mouth as  needed.     [name] ibuprofen (ADVIL,MOTRIN) 200 MG tablet Take 200 mg by mouth every 6 (six) hours as needed.    . latanoprost (XALATAN) 0.005 % ophthalmic solution Place 1 drop into both eyes at bedtime.     . triamterene-hydrochlorothiazide (MAXZIDE-25) 37.5-25 MG tablet Take 1 tablet by mouth daily. 37.5-25    . fexofenadine (ALLEGRA) 180 MG tablet Take 180 mg by mouth as needed.      No current facility-administered medications for this encounter.      REVIEW OF SYSTEMS: On review of systems, the patient reports that she is doing well overall. She denies any concerns since surgery in the RUE or chest. She denies any chest pain, shortness of breath, cough, fevers, chills, night sweats, unintended weight changes. States her incisions are well healed. She has been using vaseline occasionally on the incision to promote healing. She denies any lymphedema issues. She denies any bowel or bladder disturbances, and denies abdominal pain, nausea or vomiting. She denies any new musculoskeletal or joint aches or pains. A complete review of systems is obtained and is otherwise negative.     PHYSICAL EXAM:  Wt Readings from Last 3 Encounters:  08/12/17 194 lb 3.2 oz (88.1 kg)  08/12/17 194 lb 14.4 oz (88.4 kg)  07/29/17 191 lb (86.6 kg)   Temp Readings from Last 3 Encounters:  08/12/17 98.2 F (36.8 C) (Oral)  08/12/17 99.1 F (37.3 C) (Oral)  07/29/17 98.4 F (36.9 C) (Oral)   BP Readings from Last 3 Encounters:  08/12/17 (!) 157/79  08/12/17 (!) 146/54  07/29/17 (!) 142/69   Pulse Readings from Last 3 Encounters:  08/12/17 75  08/12/17 74  07/29/17 70   Pain Assessment Pain Score: 1 /10  In general this is a well appearing caucasian female in no acute distress. She's alert and oriented x4 and appropriate throughout the examination. Cardiopulmonary assessment is negative for acute distress and she exhibits normal effort. The right breast reveals a well healed lumpectomy site without  erythema or edema.   ECOG = 0  0 - Asymptomatic (Fully active, able to carry on all predisease activities without restriction)  1 - Symptomatic but completely ambulatory (Restricted in physically strenuous activity but ambulatory and able to carry out work of a light or sedentary nature. For example, light housework, office work)  2 - Symptomatic, <50% in bed during the day (Ambulatory and capable of all self care but unable to carry out any work activities. Up and about more than  50% of waking hours)  3 - Symptomatic, >50% in bed, but not bedbound (Capable of only limited self-care, confined to bed or chair 50% or more of waking hours)  4 - Bedbound (Completely disabled. Cannot carry on any self-care. Totally confined to bed or chair)  5 - Death   Eustace Pen MM, Creech RH, Tormey DC, et al. [phone]). "Toxicity and response criteria of the [facility]". [place] Oncol. 5 (6): 649-55    LABORATORY DATA:  No results found for: WBC, HGB, HCT, MCV, PLT Lab Results  Component Value Date   NA 137 07/10/2017   K 4.0 07/10/2017   CL 102 07/10/2017   CO2 24 07/10/2017   No results found for: ALT, AST, GGT, ALKPHOS, BILITOT    RADIOGRAPHY: Nm Sentinel Node Inj-no Rpt (breast)  Result Date: 07/29/2017 Sulfur colloid was injected by the nuclear medicine technologist for melanoma sentinel node.   Mm Breast Surgical Specimen  Result Date: 07/15/2017 CLINICAL DATA:  Status post excision of a right breast lesion following radioactive seed localization. EXAM: SPECIMEN RADIOGRAPH OF THE RIGHT BREAST COMPARISON:  Previous exam(s). FINDINGS: Status post excision of the right breast. Radioactive seed was and a smaller separate specimen. The ribbon shaped biopsy clip with associated density and small calcifications lie within the central aspect of the primary surgical specimen. The location of the biopsy clip was marked for pathology. IMPRESSION: Specimen radiograph of the right  breast. Electronically Signed   By: Lajean Manes M.D.   On: 07/15/2017 12:28   Mm Rt Radioactive Seed Loc Mammo Guide  Result Date: 07/14/2017 CLINICAL DATA:  Biopsy-proven DCIS involving the upper outer quadrant of the right breast at anterior to middle depth. Radioactive seed localization in anticipation of lumpectomy which is scheduled for tomorrow. EXAM: MAMMOGRAPHIC GUIDED RADIOACTIVE SEED LOCALIZATION OF THE RIGHT BREAST COMPARISON:  Previous exam(s). FINDINGS: Patient presents for radioactive seed localization prior to right breast lumpectomy. I met with the patient and we discussed the procedure of seed localization including benefits and alternatives. We discussed the high likelihood of a successful procedure. We discussed the risks of the procedure including infection, bleeding, tissue injury and further surgery. We discussed the low dose of radioactivity involved in the procedure. Informed, written consent was given. The usual time-out protocol was performed immediately prior to the procedure. Using mammographic guidance, sterile technique with chlorhexidine as skin antisepsis, 1% lidocaine as local anesthetic, an I-125 radioactive seed was used to localize the ribbon shaped tissue marker clip in the upper outer right breast using a lateral approach. The follow-up mammogram images confirm the seed in the expected location and were marked for Dr. Georgette Dover. Follow-up survey of the patient confirms the presence of the radioactive seed. Order number of I-125 seed: [id number] Total activity: 0.249 mCi  Reference Date: 07/04/2017 The patient tolerated the procedure well and was released from the [facility]. She was given instructions regarding seed removal. IMPRESSION: Radioactive seed localization of biopsy-proven DCIS involving the upper outer right breast. No apparent complications. The seed is approximately 7 mm lateral to the ribbon shaped tissue marker clip. Calcifications extend approximately 1.2 cm  anterior to the ribbon shaped tissue marker clip. Electronically Signed   By: Evangeline Dakin M.D.   On: 07/14/2017 14:32       IMPRESSION/PLAN: 1. Stage IA, [id number] ER/PR positive, grade 2, invasive and in situ ductal carcinoma of the right breast. Dr. Lisbeth Renshaw discusses the pathology findings and reviews the nature of invasive breast disease.  The patient has healed well to proceed with radiotherapy, however we will await the results for her oncotype dx score to determine a role for systemic therapy. Provided that chemotherapy is not indicated, the patient's course would then be followed by external radiotherapy to the breast followed by antiestrogen therapy. We discussed the risks, benefits, short, and long term effects of radiotherapy, and the patient is interested in proceeding. Dr. Lisbeth Renshaw discusses the delivery and logistics of radiotherapy and anticipates a course of 4 weeks. We will plan simulation in about 2 1/2 weeks, with the plans to cancel this if she needs systemic therapy. Written consent is obtained and placed in the chart, a copy was provided to the patient. 2. Genetic predisposition. Based on the patient's disease and strong family history of breast cancer, we discussed the benefits of genetic testing. The patient is interested in a referral for genetics counseling and this will be placed today.   In a visit lasting 60 minutes, greater than 50% of the time was spent face to face discussing her case particularly reviewing why lymphatics are checked, and coordinating the patient's care.  The above documentation reflects my direct findings during this shared patient visit. Please see the separate note by Dr. Lisbeth Renshaw on this date for the remainder of the patient's plan of care.    Carola Rhine, PAC  This document serves as a record of services personally performed by Kyung Rudd, MD and Shona Simpson, PA-C. It was created on their behalf by Arlyce Harman, a trained medical scribe.  The creation of this record is based on the scribe's personal observations and the provider's statements to them. This document has been checked and approved by the attending provider.

## 2017-08-12 NOTE — Assessment & Plan Note (Signed)
Right lumpectomy: Grade 2 IDC with DCIS, margins negative, invasive tumor 0.8 cm the remainder was DCIS, 0/7 lymph nodes negative, ER 100%, PR 95%, Ki-67 10%, HER-2 negative ratio 1.43, T1b N0 stage I a  Pathology and radiology counseling: Discussed with the patient, the details of pathology including the type of breast cancer,the clinical staging, the significance of ER, PR and HER-2/neu receptors and the implications for treatment. After reviewing the pathology in detail, we proceeded to discuss the different treatment options between radiation, chemotherapy, antiestrogen therapies.  Given her advanced age and favorable characteristics of the cancer, I did not recommend systemic chemotherapy.  I did not recommend Oncotype testing either.  Recommendation: 1.  Adjuvant radiation therapy followed by 2. adjuvant antiestrogen therapy with anastrozole 1 mg daily times 5 years  Return to clinic after radiation therapy to begin antiestrogen treatment

## 2017-08-22 ENCOUNTER — Encounter (HOSPITAL_COMMUNITY): Payer: Self-pay

## 2017-08-22 ENCOUNTER — Telehealth: Payer: Self-pay | Admitting: *Deleted

## 2017-08-22 ENCOUNTER — Telehealth: Payer: Self-pay | Admitting: Hematology and Oncology

## 2017-08-22 NOTE — Telephone Encounter (Signed)
Received oncotype results of 2/4%.  Left message for a return phone call to inform patient.

## 2017-08-22 NOTE — Telephone Encounter (Signed)
I informed the patient that her Oncotype score is 2 which is low risk.  She does not need chemotherapy. I will inform radiation oncology to begin her radiation treatment program I would like to see her on the last day of her radiation.

## 2017-08-29 ENCOUNTER — Ambulatory Visit
Admission: RE | Admit: 2017-08-29 | Discharge: 2017-08-29 | Disposition: A | Discharge status: Home / Self Care | Payer: Medicare Other | Source: Ambulatory Visit | Attending: Radiation Oncology | Admitting: Radiation Oncology

## 2017-08-29 DIAGNOSIS — C50411 Malignant neoplasm of upper-outer quadrant of right female breast: Secondary | ICD-10-CM

## 2017-08-29 DIAGNOSIS — Z510 Encounter for antineoplastic radiation therapy: Secondary | ICD-10-CM | POA: Diagnosis not present

## 2017-08-29 DIAGNOSIS — Z170 Estrogen receptor positive status [ER+]: Principal | ICD-10-CM

## 2017-08-29 DIAGNOSIS — M160 Bilateral primary osteoarthritis of hip: Secondary | ICD-10-CM | POA: Diagnosis not present

## 2017-08-29 DIAGNOSIS — Z803 Family history of malignant neoplasm of breast: Secondary | ICD-10-CM | POA: Diagnosis not present

## 2017-08-29 DIAGNOSIS — H409 Unspecified glaucoma: Secondary | ICD-10-CM | POA: Diagnosis not present

## 2017-09-02 DIAGNOSIS — Z510 Encounter for antineoplastic radiation therapy: Secondary | ICD-10-CM | POA: Diagnosis not present

## 2017-09-02 DIAGNOSIS — H409 Unspecified glaucoma: Secondary | ICD-10-CM | POA: Diagnosis not present

## 2017-09-02 DIAGNOSIS — Z803 Family history of malignant neoplasm of breast: Secondary | ICD-10-CM | POA: Diagnosis not present

## 2017-09-02 DIAGNOSIS — Z170 Estrogen receptor positive status [ER+]: Secondary | ICD-10-CM | POA: Diagnosis not present

## 2017-09-02 DIAGNOSIS — M160 Bilateral primary osteoarthritis of hip: Secondary | ICD-10-CM | POA: Diagnosis not present

## 2017-09-02 DIAGNOSIS — C50411 Malignant neoplasm of upper-outer quadrant of right female breast: Secondary | ICD-10-CM | POA: Diagnosis not present

## 2017-09-02 NOTE — Progress Notes (Signed)
  Radiation Oncology         [phone] ________________________________  Name: Cheyenne Lee MRN: [id number]  Date: 08/29/2017  DOB: Feb 21, 1941   DIAGNOSIS:     ICD-10-CM   1. Malignant neoplasm of upper-outer quadrant of right breast in female, estrogen receptor positive ([place]) C50.411    Z17.0     SIMULATION AND TREATMENT PLANNING NOTE  The patient presented for simulation prior to beginning her course of radiation treatment for her diagnosis of right-sided breast cancer. The patient was placed in a supine position on a breast board. A customized vac-lock bag was constructed and this complex treatment device will be used on a daily basis during her treatment. In this fashion, a CT scan was obtained through the chest area and an isocenter was placed near the chest wall within the breast.  The patient will be planned to receive a course of radiation initially to a dose of 42.5 Gy. This will consist of a whole breast radiotherapy technique. To accomplish this, 2 customized blocks have been designed which will correspond to medial and lateral whole breast tangent fields. This treatment will be accomplished at 2.5 Gy per fraction. A forward planning technique will also be evaluated to determine if this approach improves the plan. It is anticipated that the patient will then receive a 7.5 Gy boost to the seroma cavity which has been contoured. This will be accomplished at 2.5 Gy per fraction.   This initial treatment will consist of a 3-D conformal technique. The seroma has been contoured as the primary target structure. Additionally, dose volume histograms of both this target as well as the lungs and heart will also be evaluated. Such an approach is necessary to ensure that the target area is adequately covered while the nearby critical  normal structures are adequately spared.  Plan:  The final anticipated total dose therefore will correspond to 50  Gy.    _______________________________   Jodelle Gross, MD, PhD

## 2017-09-02 NOTE — Progress Notes (Signed)
  Radiation Oncology         [phone] ________________________________  Name: Cheyenne Lee MRN: [id number]  Date: 08/29/2017  DOB: [birth year]  Optical Surface Tracking Plan:  Since intensity modulated radiotherapy (IMRT) and 3D conformal radiation treatment methods are predicated on accurate and precise positioning for treatment, intrafraction motion monitoring is medically necessary to ensure accurate and safe treatment delivery.  The ability to quantify intrafraction motion without excessive ionizing radiation dose can only be performed with optical surface tracking. Accordingly, surface imaging offers the opportunity to obtain 3D measurements of patient position throughout IMRT and 3D treatments without excessive radiation exposure.  I am ordering optical surface tracking for this patient's upcoming course of radiotherapy. ________________________________  Kyung Rudd, MD 09/02/2017 9:49 AM    Reference:   Ursula Alert, J, et al. Surface imaging-based analysis of intrafraction motion for breast radiotherapy patients.Journal of [place], n. 6, nov. 2014. ISSN [id number].   Available at: <http://www.jacmp.org/index.php/jacmp/article/view/4957>.

## 2017-09-05 ENCOUNTER — Ambulatory Visit
Admission: RE | Admit: 2017-09-05 | Discharge: 2017-09-05 | Disposition: A | Discharge status: Home / Self Care | Payer: Medicare Other | Source: Ambulatory Visit | Attending: Radiation Oncology | Admitting: Radiation Oncology

## 2017-09-05 DIAGNOSIS — H409 Unspecified glaucoma: Secondary | ICD-10-CM | POA: Diagnosis not present

## 2017-09-05 DIAGNOSIS — Z803 Family history of malignant neoplasm of breast: Secondary | ICD-10-CM | POA: Diagnosis not present

## 2017-09-05 DIAGNOSIS — Z510 Encounter for antineoplastic radiation therapy: Secondary | ICD-10-CM | POA: Diagnosis not present

## 2017-09-05 DIAGNOSIS — C50411 Malignant neoplasm of upper-outer quadrant of right female breast: Secondary | ICD-10-CM | POA: Diagnosis not present

## 2017-09-05 DIAGNOSIS — M160 Bilateral primary osteoarthritis of hip: Secondary | ICD-10-CM | POA: Diagnosis not present

## 2017-09-05 DIAGNOSIS — Z170 Estrogen receptor positive status [ER+]: Secondary | ICD-10-CM | POA: Diagnosis not present

## 2017-09-08 ENCOUNTER — Ambulatory Visit
Admission: RE | Admit: 2017-09-08 | Discharge: 2017-09-08 | Disposition: A | Discharge status: Home / Self Care | Payer: Medicare Other | Source: Ambulatory Visit | Attending: Radiation Oncology | Admitting: Radiation Oncology

## 2017-09-08 DIAGNOSIS — Z510 Encounter for antineoplastic radiation therapy: Secondary | ICD-10-CM | POA: Diagnosis not present

## 2017-09-08 DIAGNOSIS — M160 Bilateral primary osteoarthritis of hip: Secondary | ICD-10-CM | POA: Diagnosis not present

## 2017-09-08 DIAGNOSIS — H409 Unspecified glaucoma: Secondary | ICD-10-CM | POA: Diagnosis not present

## 2017-09-08 DIAGNOSIS — Z170 Estrogen receptor positive status [ER+]: Secondary | ICD-10-CM | POA: Diagnosis not present

## 2017-09-08 DIAGNOSIS — Z803 Family history of malignant neoplasm of breast: Secondary | ICD-10-CM | POA: Diagnosis not present

## 2017-09-08 DIAGNOSIS — C50411 Malignant neoplasm of upper-outer quadrant of right female breast: Secondary | ICD-10-CM | POA: Diagnosis not present

## 2017-09-09 ENCOUNTER — Ambulatory Visit
Admission: RE | Admit: 2017-09-09 | Discharge: 2017-09-09 | Disposition: A | Discharge status: Home / Self Care | Payer: Medicare Other | Source: Ambulatory Visit | Attending: Radiation Oncology | Admitting: Radiation Oncology

## 2017-09-09 DIAGNOSIS — M160 Bilateral primary osteoarthritis of hip: Secondary | ICD-10-CM | POA: Diagnosis not present

## 2017-09-09 DIAGNOSIS — Z510 Encounter for antineoplastic radiation therapy: Secondary | ICD-10-CM | POA: Diagnosis not present

## 2017-09-09 DIAGNOSIS — C50411 Malignant neoplasm of upper-outer quadrant of right female breast: Secondary | ICD-10-CM | POA: Diagnosis not present

## 2017-09-09 DIAGNOSIS — Z803 Family history of malignant neoplasm of breast: Secondary | ICD-10-CM | POA: Diagnosis not present

## 2017-09-09 DIAGNOSIS — Z170 Estrogen receptor positive status [ER+]: Secondary | ICD-10-CM | POA: Diagnosis not present

## 2017-09-09 DIAGNOSIS — H409 Unspecified glaucoma: Secondary | ICD-10-CM | POA: Diagnosis not present

## 2017-09-10 ENCOUNTER — Ambulatory Visit
Admission: RE | Admit: 2017-09-10 | Discharge: 2017-09-10 | Disposition: A | Discharge status: Home / Self Care | Payer: Medicare Other | Source: Ambulatory Visit | Attending: Radiation Oncology | Admitting: Radiation Oncology

## 2017-09-10 ENCOUNTER — Ambulatory Visit: Payer: Medicare Other

## 2017-09-10 DIAGNOSIS — H409 Unspecified glaucoma: Secondary | ICD-10-CM | POA: Diagnosis not present

## 2017-09-10 DIAGNOSIS — Z510 Encounter for antineoplastic radiation therapy: Secondary | ICD-10-CM | POA: Diagnosis not present

## 2017-09-10 DIAGNOSIS — M160 Bilateral primary osteoarthritis of hip: Secondary | ICD-10-CM | POA: Diagnosis not present

## 2017-09-10 DIAGNOSIS — Z170 Estrogen receptor positive status [ER+]: Secondary | ICD-10-CM | POA: Diagnosis not present

## 2017-09-10 DIAGNOSIS — Z803 Family history of malignant neoplasm of breast: Secondary | ICD-10-CM | POA: Diagnosis not present

## 2017-09-10 DIAGNOSIS — C50411 Malignant neoplasm of upper-outer quadrant of right female breast: Secondary | ICD-10-CM | POA: Diagnosis not present

## 2017-09-11 ENCOUNTER — Ambulatory Visit
Admission: RE | Admit: 2017-09-11 | Discharge: 2017-09-11 | Disposition: A | Discharge status: Home / Self Care | Payer: Medicare Other | Source: Ambulatory Visit | Attending: Radiation Oncology | Admitting: Radiation Oncology

## 2017-09-11 DIAGNOSIS — C50411 Malignant neoplasm of upper-outer quadrant of right female breast: Secondary | ICD-10-CM

## 2017-09-11 DIAGNOSIS — Z510 Encounter for antineoplastic radiation therapy: Secondary | ICD-10-CM | POA: Diagnosis not present

## 2017-09-11 DIAGNOSIS — Z170 Estrogen receptor positive status [ER+]: Principal | ICD-10-CM

## 2017-09-11 DIAGNOSIS — H409 Unspecified glaucoma: Secondary | ICD-10-CM | POA: Diagnosis not present

## 2017-09-11 DIAGNOSIS — M160 Bilateral primary osteoarthritis of hip: Secondary | ICD-10-CM | POA: Diagnosis not present

## 2017-09-11 DIAGNOSIS — Z803 Family history of malignant neoplasm of breast: Secondary | ICD-10-CM | POA: Diagnosis not present

## 2017-09-11 MED ORDER — ALRA NON-METALLIC DEODORANT (RAD-ONC)
1.0000 "application " | Freq: Once | TOPICAL | Status: AC
  Administered 2017-09-11: 1 via TOPICAL

## 2017-09-11 MED ORDER — RADIAPLEXRX EX GEL
Freq: Once | CUTANEOUS | Status: AC
  Administered 2017-09-11: 12:00:00 via TOPICAL

## 2017-09-11 NOTE — Progress Notes (Signed)
Patient education done, my business card, Alra deodorant  and radiaplex given, discussed side effects, skin irritation,pain, swelling, of breast, fatigue, apply radiaplex after rad tx and bedtime daily for moisturizing and soothing skin, alra  after rad tx and prn,, no skin products 4 hours prior to radiation treatments; luke warm shower/bath,no rubbing,scrubbing, or scratching treated area, unscented soap such as dove,mild, pat dry, no under wire bra if possible, electric shaver only for axilla  Being treated;increase protein in diet, stay hydrated,drink plenty water, sees MD weekly and prn, teach back given

## 2017-09-12 ENCOUNTER — Ambulatory Visit
Admission: RE | Admit: 2017-09-12 | Discharge: 2017-09-12 | Disposition: A | Discharge status: Home / Self Care | Payer: Medicare Other | Source: Ambulatory Visit | Attending: Radiation Oncology | Admitting: Radiation Oncology

## 2017-09-12 DIAGNOSIS — Z170 Estrogen receptor positive status [ER+]: Secondary | ICD-10-CM | POA: Diagnosis not present

## 2017-09-12 DIAGNOSIS — H409 Unspecified glaucoma: Secondary | ICD-10-CM | POA: Diagnosis not present

## 2017-09-12 DIAGNOSIS — C50411 Malignant neoplasm of upper-outer quadrant of right female breast: Secondary | ICD-10-CM | POA: Diagnosis not present

## 2017-09-12 DIAGNOSIS — Z803 Family history of malignant neoplasm of breast: Secondary | ICD-10-CM | POA: Diagnosis not present

## 2017-09-12 DIAGNOSIS — Z510 Encounter for antineoplastic radiation therapy: Secondary | ICD-10-CM | POA: Diagnosis not present

## 2017-09-12 DIAGNOSIS — M160 Bilateral primary osteoarthritis of hip: Secondary | ICD-10-CM | POA: Diagnosis not present

## 2017-09-15 ENCOUNTER — Ambulatory Visit
Admission: RE | Admit: 2017-09-15 | Discharge: 2017-09-15 | Disposition: A | Discharge status: Home / Self Care | Payer: Medicare Other | Source: Ambulatory Visit | Attending: Radiation Oncology | Admitting: Radiation Oncology

## 2017-09-15 DIAGNOSIS — Z170 Estrogen receptor positive status [ER+]: Secondary | ICD-10-CM | POA: Diagnosis not present

## 2017-09-15 DIAGNOSIS — Z803 Family history of malignant neoplasm of breast: Secondary | ICD-10-CM | POA: Diagnosis not present

## 2017-09-15 DIAGNOSIS — H409 Unspecified glaucoma: Secondary | ICD-10-CM | POA: Diagnosis not present

## 2017-09-15 DIAGNOSIS — M160 Bilateral primary osteoarthritis of hip: Secondary | ICD-10-CM | POA: Diagnosis not present

## 2017-09-15 DIAGNOSIS — C50411 Malignant neoplasm of upper-outer quadrant of right female breast: Secondary | ICD-10-CM | POA: Diagnosis not present

## 2017-09-15 DIAGNOSIS — Z510 Encounter for antineoplastic radiation therapy: Secondary | ICD-10-CM | POA: Diagnosis not present

## 2017-09-17 ENCOUNTER — Ambulatory Visit
Admission: RE | Admit: 2017-09-17 | Discharge: 2017-09-17 | Disposition: A | Discharge status: Home / Self Care | Payer: Medicare Other | Source: Ambulatory Visit | Attending: Radiation Oncology | Admitting: Radiation Oncology

## 2017-09-17 DIAGNOSIS — Z803 Family history of malignant neoplasm of breast: Secondary | ICD-10-CM | POA: Diagnosis not present

## 2017-09-17 DIAGNOSIS — C50411 Malignant neoplasm of upper-outer quadrant of right female breast: Secondary | ICD-10-CM | POA: Diagnosis not present

## 2017-09-17 DIAGNOSIS — H409 Unspecified glaucoma: Secondary | ICD-10-CM | POA: Diagnosis not present

## 2017-09-17 DIAGNOSIS — M160 Bilateral primary osteoarthritis of hip: Secondary | ICD-10-CM | POA: Diagnosis not present

## 2017-09-17 DIAGNOSIS — Z510 Encounter for antineoplastic radiation therapy: Secondary | ICD-10-CM | POA: Diagnosis not present

## 2017-09-17 DIAGNOSIS — Z170 Estrogen receptor positive status [ER+]: Secondary | ICD-10-CM | POA: Diagnosis not present

## 2017-09-18 ENCOUNTER — Ambulatory Visit
Admission: RE | Admit: 2017-09-18 | Discharge: 2017-09-18 | Disposition: A | Discharge status: Home / Self Care | Payer: Medicare Other | Source: Ambulatory Visit | Attending: Radiation Oncology | Admitting: Radiation Oncology

## 2017-09-18 DIAGNOSIS — M160 Bilateral primary osteoarthritis of hip: Secondary | ICD-10-CM | POA: Diagnosis not present

## 2017-09-18 DIAGNOSIS — Z803 Family history of malignant neoplasm of breast: Secondary | ICD-10-CM | POA: Diagnosis not present

## 2017-09-18 DIAGNOSIS — Z510 Encounter for antineoplastic radiation therapy: Secondary | ICD-10-CM | POA: Diagnosis not present

## 2017-09-18 DIAGNOSIS — C50411 Malignant neoplasm of upper-outer quadrant of right female breast: Secondary | ICD-10-CM | POA: Diagnosis not present

## 2017-09-18 DIAGNOSIS — H409 Unspecified glaucoma: Secondary | ICD-10-CM | POA: Diagnosis not present

## 2017-09-18 DIAGNOSIS — Z170 Estrogen receptor positive status [ER+]: Secondary | ICD-10-CM | POA: Diagnosis not present

## 2017-09-19 ENCOUNTER — Ambulatory Visit
Admission: RE | Admit: 2017-09-19 | Discharge: 2017-09-19 | Disposition: A | Discharge status: Home / Self Care | Payer: Medicare Other | Source: Ambulatory Visit | Attending: Radiation Oncology | Admitting: Radiation Oncology

## 2017-09-19 DIAGNOSIS — Z170 Estrogen receptor positive status [ER+]: Secondary | ICD-10-CM | POA: Diagnosis not present

## 2017-09-19 DIAGNOSIS — Z510 Encounter for antineoplastic radiation therapy: Secondary | ICD-10-CM | POA: Diagnosis not present

## 2017-09-19 DIAGNOSIS — Z803 Family history of malignant neoplasm of breast: Secondary | ICD-10-CM | POA: Diagnosis not present

## 2017-09-19 DIAGNOSIS — M160 Bilateral primary osteoarthritis of hip: Secondary | ICD-10-CM | POA: Diagnosis not present

## 2017-09-19 DIAGNOSIS — H409 Unspecified glaucoma: Secondary | ICD-10-CM | POA: Diagnosis not present

## 2017-09-19 DIAGNOSIS — C50411 Malignant neoplasm of upper-outer quadrant of right female breast: Secondary | ICD-10-CM | POA: Diagnosis not present

## 2017-09-22 ENCOUNTER — Ambulatory Visit
Admission: RE | Admit: 2017-09-22 | Discharge: 2017-09-22 | Disposition: A | Discharge status: Home / Self Care | Payer: Medicare Other | Source: Ambulatory Visit | Attending: Radiation Oncology | Admitting: Radiation Oncology

## 2017-09-22 DIAGNOSIS — H409 Unspecified glaucoma: Secondary | ICD-10-CM | POA: Diagnosis not present

## 2017-09-22 DIAGNOSIS — C50411 Malignant neoplasm of upper-outer quadrant of right female breast: Secondary | ICD-10-CM | POA: Diagnosis not present

## 2017-09-22 DIAGNOSIS — Z170 Estrogen receptor positive status [ER+]: Secondary | ICD-10-CM | POA: Diagnosis not present

## 2017-09-22 DIAGNOSIS — Z510 Encounter for antineoplastic radiation therapy: Secondary | ICD-10-CM | POA: Diagnosis not present

## 2017-09-22 DIAGNOSIS — Z803 Family history of malignant neoplasm of breast: Secondary | ICD-10-CM | POA: Diagnosis not present

## 2017-09-22 DIAGNOSIS — M160 Bilateral primary osteoarthritis of hip: Secondary | ICD-10-CM | POA: Diagnosis not present

## 2017-09-24 ENCOUNTER — Ambulatory Visit
Admission: RE | Admit: 2017-09-24 | Discharge: 2017-09-24 | Disposition: A | Discharge status: Home / Self Care | Payer: Medicare Other | Source: Ambulatory Visit | Attending: Radiation Oncology | Admitting: Radiation Oncology

## 2017-09-24 ENCOUNTER — Encounter: Payer: Self-pay | Admitting: Genetic Counselor

## 2017-09-24 ENCOUNTER — Ambulatory Visit (HOSPITAL_BASED_OUTPATIENT_CLINIC_OR_DEPARTMENT_OTHER): Payer: Medicare Other | Admitting: Genetic Counselor

## 2017-09-24 ENCOUNTER — Other Ambulatory Visit: Payer: Medicare Other

## 2017-09-24 DIAGNOSIS — Z801 Family history of malignant neoplasm of trachea, bronchus and lung: Secondary | ICD-10-CM

## 2017-09-24 DIAGNOSIS — K589 Irritable bowel syndrome without diarrhea: Secondary | ICD-10-CM | POA: Diagnosis not present

## 2017-09-24 DIAGNOSIS — M160 Bilateral primary osteoarthritis of hip: Secondary | ICD-10-CM | POA: Diagnosis not present

## 2017-09-24 DIAGNOSIS — Z800 Family history of malignant neoplasm of digestive organs: Secondary | ICD-10-CM | POA: Diagnosis not present

## 2017-09-24 DIAGNOSIS — Z809 Family history of malignant neoplasm, unspecified: Secondary | ICD-10-CM | POA: Diagnosis not present

## 2017-09-24 DIAGNOSIS — Z86010 Personal history of colonic polyps: Secondary | ICD-10-CM

## 2017-09-24 DIAGNOSIS — Z803 Family history of malignant neoplasm of breast: Secondary | ICD-10-CM

## 2017-09-24 DIAGNOSIS — Z170 Estrogen receptor positive status [ER+]: Secondary | ICD-10-CM

## 2017-09-24 DIAGNOSIS — Z315 Encounter for genetic counseling: Secondary | ICD-10-CM | POA: Diagnosis not present

## 2017-09-24 DIAGNOSIS — Z885 Allergy status to narcotic agent status: Secondary | ICD-10-CM | POA: Diagnosis not present

## 2017-09-24 DIAGNOSIS — H409 Unspecified glaucoma: Secondary | ICD-10-CM | POA: Diagnosis not present

## 2017-09-24 DIAGNOSIS — I10 Essential (primary) hypertension: Secondary | ICD-10-CM | POA: Diagnosis not present

## 2017-09-24 DIAGNOSIS — Z510 Encounter for antineoplastic radiation therapy: Secondary | ICD-10-CM | POA: Diagnosis not present

## 2017-09-24 DIAGNOSIS — Z807 Family history of other malignant neoplasms of lymphoid, hematopoietic and related tissues: Secondary | ICD-10-CM | POA: Diagnosis not present

## 2017-09-24 DIAGNOSIS — C50411 Malignant neoplasm of upper-outer quadrant of right female breast: Secondary | ICD-10-CM | POA: Diagnosis not present

## 2017-09-24 DIAGNOSIS — Z79899 Other long term (current) drug therapy: Secondary | ICD-10-CM | POA: Diagnosis not present

## 2017-09-24 NOTE — Progress Notes (Addendum)
REFERRING PROVIDER: Nicholas Lose, MD [place], [place] [address]  PRIMARY PROVIDER:  Rikki Spearing P, DO  PRIMARY REASON FOR VISIT:  1. Malignant neoplasm of upper-outer quadrant of right breast in female, estrogen receptor positive ([place])   2. Family history of colon cancer   3. Family history of breast cancer     HISTORY OF PRESENT ILLNESS:   [name], a 77 y.o. female, was seen for a [facility] cancer genetics consultation at the request of Dr. Lindi Lee due to a personal and family history of cancer.  Cheyenne Lee presents to clinic today to discuss the possibility of a hereditary predisposition to cancer, genetic testing, and to further clarify her future cancer risks, as well as potential cancer risks for family members.   In 2018, at the age of 76, Cheyenne Lee was diagnosed with invasive ductal carcinoma of the right breast. The tumor is ER+/PR+/Her2-.  She reports having a total of 8 biopsies in her lifetime, with most being fibroadenoma's.  She also reports having multiple colonoscopies, resulting in 3 polyps for each colonoscopy.  For a total of 9 polyps.     CANCER HISTORY:    Malignant neoplasm of upper-outer quadrant of right breast in female, estrogen receptor positive ([place])   07/15/2017 Surgery    Right lumpectomy: Grade 2 IDC with DCIS, margins negative, invasive tumor 0.8 cm the remainder was DCIS, 0/7 lymph nodes negative, ER 100%, PR 95%, Ki-67 10%, HER-2 negative ratio 1.43, T1b N0 stage I a        HORMONAL RISK FACTORS:  Menarche was at age 71.  First live birth at age 44.  OCP use for approximately 0 years.  Ovaries intact: yes.  Hysterectomy: no.  Menopausal status: postmenopausal.  HRT use: 0 years. Colonoscopy: yes; 9 polyps. Mammogram within the last year: yes. Number of breast biopsies: 8. Up to date with pelvic exams:  yes. Any excessive radiation exposure in the past:  no  Past Medical History:  Diagnosis Date  .  Arthritis    hips  . Breast cancer ([place]) 06/25/2017   Right breast  . Cancer ([place])    right breast DCIS  . Complication of anesthesia   . Family history of breast cancer   . Family history of colon cancer   . Family history of malignant neoplasm of breast   . Glaucoma   . H/O seasonal allergies   . Hypertension   . IBS (irritable bowel syndrome)   . PONV (postoperative nausea and vomiting)     Past Surgical History:  Procedure Laterality Date  . AXILLARY LYMPH NODE DISSECTION Right 07/29/2017   Procedure: AXILLARY LYMPH NODE DISSECTION;  Surgeon: Donnie Mesa, MD;  Location: [facility];  Service: General;  Laterality: Right;  . AXILLARY SENTINEL NODE BIOPSY Right 07/29/2017   Procedure: RIGHT AXILLARY SENTINEL NODE BIOPSY;  Surgeon: Donnie Mesa, MD;  Location: [facility];  Service: General;  Laterality: Right;  . BREAST BIOPSY     Multiple Biopsies, both breasts  . BREAST LUMPECTOMY WITH RADIOACTIVE SEED LOCALIZATION Right 07/15/2017   Procedure: RIGHT BREAST LUMPECTOMY WITH RADIOACTIVE SEED LOCALIZATION ERAS PATHWAY;  Surgeon: Donnie Mesa, MD;  Location: [facility];  Service: General;  Laterality: Right;  LMA  . CESAREAN SECTION     x4    Social History   Socioeconomic History  . Marital status: Married    Spouse name: Not on file  . Number of children: Not  on file  . Years of education: Not on file  . Highest education level: Not on file  Social Needs  . Financial resource strain: Not on file  . Food insecurity - worry: Not on file  . Food insecurity - inability: Not on file  . Transportation needs - medical: Not on file  . Transportation needs - non-medical: Not on file  Occupational History  . Not on file  Tobacco Use  . Smoking status: Former Smoker    Types: Cigarettes  . Smokeless tobacco: Never Used  . Tobacco comment: was a social smoker but quit in the 1960s  Substance and Sexual Activity  . Alcohol use:  Yes    Comment: social  . Drug use: No  . Sexual activity: Not on file  Other Topics Concern  . Not on file  Social History Narrative  . Not on file     FAMILY HISTORY:  We obtained a detailed, 4-generation family history.  Significant diagnoses are listed below: Family History  Problem Relation Age of Onset  . Cancer Father 45       unk. primary; deceased 96  . Breast cancer Sister 69       currently 67; history of NHL  . Lung cancer Maternal Aunt        deceased 15; smoker  . Lung cancer Maternal Uncle        2 uncles; smokers; deceased  . Breast cancer Paternal Aunt 21       deceased 82s  . Cancer Paternal Uncle        unk. primary; deceased late 24s  . Breast cancer Paternal Grandmother        deceased 72  . Heart attack Paternal Grandfather   . Breast cancer Paternal Aunt 66       deceased 59s  . Colon cancer Paternal Aunt   . Breast cancer Cousin        pat cousin; daughter of an aunt w/ BC; dx 50s; deceased 12  . Cancer Maternal Aunt        unk. primary; deceased 32s  . Lung cancer Maternal Uncle        smoker  . Cancer Other 79       lymphoma in niece; daughter of her brother; deceased  . Dementia Mother   . Heart attack Brother   . Stroke Maternal Grandmother   . Lung cancer Maternal Grandfather        deceased at 63  . Cancer Paternal Uncle        unknown cancer  . Colon cancer Cousin        died in his early 18s  . Cancer Cousin        2 pat female cousins with unknown cancer  . Colon polyps Son 8       6 polyps    The patient has four children, three sons and a daughter.  All of her children are cancer free.  She has one brother and one sister.  Her brother died of a heart attack.  He had five children, one daughter had NHL and died at 62.  She has a sister who had NHL at 42 and breast cancer at 18.  Both parents are deceased.  The patient's mother died at 35 from old age.  She had three sisters and five brothers.  One brother died at 68 from a car  accident, two brothers died of lung cancer from a result of smoking,  two brothers had heat attacks. One of the brothers had a daughter with an unknown form of cancer. Two sisters died of lung cancer from smoking and one sister died in childbirth. Both maternal grandparents are deceased.  The grandfather from lung cancer and the grandmother from an unknown form of cancer.  The patient's father died of metastatic cancer with an unknown primary.  He had three brothers and three sisters.  One sister had breast cancer and died in her 3's, another sister had both breast and colon cancer, and the third sister did not have cancer.  Two brothers had unknown forms of cancer. Several first cousins also had cancer, one with breast cancer, one with colon cancer and two with unknown forms of cancer.  The paternal grandparents are deceased.  The grandmother died of breast cancer and the grandfather died form a heart attack.  Cheyenne Lee is unaware of previous family history of genetic testing for hereditary cancer risks. Patient's maternal ancestors are of [place] descent, and paternal ancestors are of [place] descent. There is no reported Ashkenazi Jewish ancestry. There is no known consanguinity.  GENETIC COUNSELING ASSESSMENT: Cheyenne Lee is a 77 y.o. female with a personal and family history of breast cancer and family history of other cancer which is somewhat suggestive of a hereditary cancer syndrome and predisposition to cancer. We, therefore, discussed and recommended the following at today's visit.   DISCUSSION: We discussed that about 5-10% of breast cancer is hereditary, with most cases due to BRCA mutation.  Other genes associated with hereditary breast cancer syndromes include ATM, CHEK2 and PALB2.  We discussed that individuals with Lynch syndrome can have colon polyps, a personal/family history of colon cancer and a personal/family history of breast cancer.  Additionally, there  are genes that can increase the risk for both colon and breast cancer, or each of these cancers individually.  We reviewed the characteristics, features and inheritance patterns of hereditary cancer syndromes. We also discussed genetic testing, including the appropriate family members to test, the process of testing, insurance coverage and turn-around-time for results. We discussed the implications of a negative, positive and/or variant of uncertain significant result. We recommended Cheyenne Lee pursue genetic testing for the Common Hereditary cancer gene panel. The Hereditary Gene Panel offered by [facility] includes sequencing and/or deletion duplication testing of the following 47 genes: APC, ATM, AXIN2, BARD1, BMPR1A, BRCA1, BRCA2, BRIP1, CDH1, CDK4, CDKN2A (p14ARF), CDKN2A (p16INK4a), CHEK2, CTNNA1, DICER1, EPCAM (Deletion/duplication testing only), GREM1 (promoter region deletion/duplication testing only), KIT, MEN1, MLH1, MSH2, MSH3, MSH6, MUTYH, NBN, NF1, NHTL1, PALB2, PDGFRA, PMS2, POLD1, POLE, PTEN, RAD50, RAD51C, RAD51D, SDHB, SDHC, SDHD, SMAD4, SMARCA4. STK11, TP53, TSC1, TSC2, and VHL.  The following genes were evaluated for sequence changes only: SDHA and HOXB13 c.251G>A variant only.   We discussed that some people do not want to undergo genetic testing due to fear of genetic discrimination.  A federal law called the Genetic Information Non-Discrimination Act (GINA) of 2008 helps protect individuals against genetic discrimination based on their genetic test results.  It impacts both health insurance and employment.  With health insurance, it protects against increased premiums, being kicked off insurance or being forced to take a test in order to be insured.  For employment it protects against hiring, firing and promoting decisions based on genetic test results.  Health status due to a cancer diagnosis is not protected under GINA.   Based on Cheyenne Lee's personal and family history of cancer, she  meets medical criteria for genetic testing. Despite that she meets criteria, she may still have an out of pocket cost. We discussed that if her out of pocket cost for testing is over $100, the laboratory will call and confirm whether she wants to proceed with testing.  If the out of pocket cost of testing is less than $100 she will be billed by the genetic testing laboratory.   PLAN: After considering the risks, benefits, and limitations, Cheyenne Lee  provided informed consent to pursue genetic testing and the blood sample was sent to [facility] for analysis of the Common Hereditary Cancer Panel. Results should be available within approximately 2-3 weeks' time, at which point they will be disclosed by telephone to Cheyenne Lee, as will any additional recommendations warranted by these results. Cheyenne Lee will receive a summary of her genetic counseling visit and a copy of her results once available. This information will also be available in Epic. We encouraged Cheyenne Lee to remain in contact with cancer genetics annually so that we can continuously update the family history and inform her of any changes in cancer genetics and testing that may be of benefit for her family. Cheyenne Lee questions were answered to her satisfaction today. Our contact information was provided should additional questions or concerns arise.  Lastly, we encouraged Cheyenne Lee to remain in contact with cancer genetics annually so that we can continuously update the family history and inform her of any changes in cancer genetics and testing that may be of benefit for this family.   [name] questions were answered to her satisfaction today. Our contact information was provided should additional questions or concerns arise. Thank you for the referral and allowing [place] to share in the care of your patient.   Marcelino Campos Lee. Florene Glen, [place], [facility] Certified Genetic Counselor [email or web address] phone:  [phone]  The patient was seen for a total of 45 minutes in face-to-face genetic counseling.  This patient was discussed with Drs. Magrinat, Cheyenne Lee and/or Burr Medico who agrees with the above.    _______________________________________________________________________ For Office Staff:  Number of people involved in session: 1 Was an Intern/ student involved with case: no

## 2017-09-25 ENCOUNTER — Ambulatory Visit
Admission: RE | Admit: 2017-09-25 | Discharge: 2017-09-25 | Disposition: A | Discharge status: Home / Self Care | Payer: Medicare Other | Source: Ambulatory Visit | Attending: Radiation Oncology | Admitting: Radiation Oncology

## 2017-09-25 DIAGNOSIS — Z170 Estrogen receptor positive status [ER+]: Secondary | ICD-10-CM | POA: Diagnosis not present

## 2017-09-25 DIAGNOSIS — C50411 Malignant neoplasm of upper-outer quadrant of right female breast: Secondary | ICD-10-CM | POA: Diagnosis not present

## 2017-09-25 DIAGNOSIS — Z510 Encounter for antineoplastic radiation therapy: Secondary | ICD-10-CM | POA: Diagnosis not present

## 2017-09-25 DIAGNOSIS — M160 Bilateral primary osteoarthritis of hip: Secondary | ICD-10-CM | POA: Diagnosis not present

## 2017-09-25 DIAGNOSIS — Z803 Family history of malignant neoplasm of breast: Secondary | ICD-10-CM | POA: Diagnosis not present

## 2017-09-25 DIAGNOSIS — H409 Unspecified glaucoma: Secondary | ICD-10-CM | POA: Diagnosis not present

## 2017-09-26 ENCOUNTER — Ambulatory Visit
Admission: RE | Admit: 2017-09-26 | Discharge: 2017-09-26 | Disposition: A | Discharge status: Home / Self Care | Payer: Medicare Other | Source: Ambulatory Visit | Attending: Radiation Oncology | Admitting: Radiation Oncology

## 2017-09-26 DIAGNOSIS — C50411 Malignant neoplasm of upper-outer quadrant of right female breast: Secondary | ICD-10-CM | POA: Diagnosis not present

## 2017-09-26 DIAGNOSIS — H409 Unspecified glaucoma: Secondary | ICD-10-CM | POA: Diagnosis not present

## 2017-09-26 DIAGNOSIS — M160 Bilateral primary osteoarthritis of hip: Secondary | ICD-10-CM | POA: Diagnosis not present

## 2017-09-26 DIAGNOSIS — Z510 Encounter for antineoplastic radiation therapy: Secondary | ICD-10-CM | POA: Diagnosis not present

## 2017-09-26 DIAGNOSIS — Z170 Estrogen receptor positive status [ER+]: Secondary | ICD-10-CM | POA: Diagnosis not present

## 2017-09-26 DIAGNOSIS — Z803 Family history of malignant neoplasm of breast: Secondary | ICD-10-CM | POA: Diagnosis not present

## 2017-09-29 ENCOUNTER — Ambulatory Visit
Admission: RE | Admit: 2017-09-29 | Discharge: 2017-09-29 | Disposition: A | Discharge status: Home / Self Care | Payer: Medicare Other | Source: Ambulatory Visit | Attending: Radiation Oncology | Admitting: Radiation Oncology

## 2017-09-29 DIAGNOSIS — M160 Bilateral primary osteoarthritis of hip: Secondary | ICD-10-CM | POA: Diagnosis not present

## 2017-09-29 DIAGNOSIS — Z510 Encounter for antineoplastic radiation therapy: Secondary | ICD-10-CM | POA: Diagnosis not present

## 2017-09-29 DIAGNOSIS — Z803 Family history of malignant neoplasm of breast: Secondary | ICD-10-CM | POA: Diagnosis not present

## 2017-09-29 DIAGNOSIS — Z170 Estrogen receptor positive status [ER+]: Secondary | ICD-10-CM | POA: Diagnosis not present

## 2017-09-29 DIAGNOSIS — C50411 Malignant neoplasm of upper-outer quadrant of right female breast: Secondary | ICD-10-CM | POA: Diagnosis not present

## 2017-09-29 DIAGNOSIS — H409 Unspecified glaucoma: Secondary | ICD-10-CM | POA: Diagnosis not present

## 2017-09-30 ENCOUNTER — Encounter: Payer: Self-pay | Admitting: Genetic Counselor

## 2017-09-30 ENCOUNTER — Ambulatory Visit
Admission: RE | Admit: 2017-09-30 | Discharge: 2017-09-30 | Disposition: A | Discharge status: Home / Self Care | Payer: Medicare Other | Source: Ambulatory Visit | Attending: Radiation Oncology | Admitting: Radiation Oncology

## 2017-09-30 ENCOUNTER — Telehealth: Payer: Self-pay | Admitting: Genetic Counselor

## 2017-09-30 DIAGNOSIS — H409 Unspecified glaucoma: Secondary | ICD-10-CM | POA: Diagnosis not present

## 2017-09-30 DIAGNOSIS — C50411 Malignant neoplasm of upper-outer quadrant of right female breast: Secondary | ICD-10-CM | POA: Diagnosis not present

## 2017-09-30 DIAGNOSIS — Z170 Estrogen receptor positive status [ER+]: Secondary | ICD-10-CM | POA: Diagnosis not present

## 2017-09-30 DIAGNOSIS — Z803 Family history of malignant neoplasm of breast: Secondary | ICD-10-CM | POA: Diagnosis not present

## 2017-09-30 DIAGNOSIS — M160 Bilateral primary osteoarthritis of hip: Secondary | ICD-10-CM | POA: Diagnosis not present

## 2017-09-30 DIAGNOSIS — Z1379 Encounter for other screening for genetic and chromosomal anomalies: Secondary | ICD-10-CM | POA: Insufficient documentation

## 2017-09-30 DIAGNOSIS — Z510 Encounter for antineoplastic radiation therapy: Secondary | ICD-10-CM | POA: Diagnosis not present

## 2017-09-30 NOTE — Telephone Encounter (Signed)
Revealed negative genetic testing.  Discussed that we do not know why she has breast cancer or why there is cancer in the family. It could be due to a different gene that we are not testing, or maybe our current technology may not be able to pick something up.  It will be important for her to keep in contact with genetics to keep up with whether additional testing may be needed.  Discussed reflexing to the Multicancer gene panel.  Patient agreed. I will call her when this is completed.

## 2017-10-01 ENCOUNTER — Ambulatory Visit
Admission: RE | Admit: 2017-10-01 | Discharge: 2017-10-01 | Disposition: A | Discharge status: Home / Self Care | Payer: Medicare Other | Source: Ambulatory Visit | Attending: Radiation Oncology | Admitting: Radiation Oncology

## 2017-10-01 DIAGNOSIS — Z803 Family history of malignant neoplasm of breast: Secondary | ICD-10-CM | POA: Diagnosis not present

## 2017-10-01 DIAGNOSIS — M160 Bilateral primary osteoarthritis of hip: Secondary | ICD-10-CM | POA: Diagnosis not present

## 2017-10-01 DIAGNOSIS — Z170 Estrogen receptor positive status [ER+]: Secondary | ICD-10-CM | POA: Diagnosis not present

## 2017-10-01 DIAGNOSIS — Z510 Encounter for antineoplastic radiation therapy: Secondary | ICD-10-CM | POA: Diagnosis not present

## 2017-10-01 DIAGNOSIS — H409 Unspecified glaucoma: Secondary | ICD-10-CM | POA: Diagnosis not present

## 2017-10-01 DIAGNOSIS — C50411 Malignant neoplasm of upper-outer quadrant of right female breast: Secondary | ICD-10-CM | POA: Diagnosis not present

## 2017-10-02 ENCOUNTER — Telehealth: Payer: Self-pay | Admitting: *Deleted

## 2017-10-02 ENCOUNTER — Ambulatory Visit
Admission: RE | Admit: 2017-10-02 | Discharge: 2017-10-02 | Disposition: A | Discharge status: Home / Self Care | Payer: Medicare Other | Source: Ambulatory Visit | Attending: Radiation Oncology | Admitting: Radiation Oncology

## 2017-10-02 DIAGNOSIS — C50411 Malignant neoplasm of upper-outer quadrant of right female breast: Secondary | ICD-10-CM | POA: Diagnosis not present

## 2017-10-02 DIAGNOSIS — Z803 Family history of malignant neoplasm of breast: Secondary | ICD-10-CM | POA: Diagnosis not present

## 2017-10-02 DIAGNOSIS — H409 Unspecified glaucoma: Secondary | ICD-10-CM | POA: Diagnosis not present

## 2017-10-02 DIAGNOSIS — Z510 Encounter for antineoplastic radiation therapy: Secondary | ICD-10-CM | POA: Diagnosis not present

## 2017-10-02 DIAGNOSIS — Z170 Estrogen receptor positive status [ER+]: Secondary | ICD-10-CM | POA: Diagnosis not present

## 2017-10-02 DIAGNOSIS — M160 Bilateral primary osteoarthritis of hip: Secondary | ICD-10-CM | POA: Diagnosis not present

## 2017-10-02 NOTE — Telephone Encounter (Signed)
Patient called because she got a phone call about an appointment and she did not understand.   Let her know that on 1/14, she has radiation therapy at 10:30 and then Dr. Lindi Adie at 10:45am.  Patient states she was never told about appt. With Dr. Lindi Adie (she knew she was to see him at some point in time, but no one ever told her when.).  Patient is concerned she will be late for appt with Dr. Lindi Adie.  I told her to come early for her radiation appt. And check in and let them know she also has appt. With Dr. Lindi Adie.  She will probably need to check in again after radiation therapy.  Let her know that I would let Dr.Gudenas nurse know that she may be late due to radiation.  Patient was very appreciative and thanked me for explaining this to her.

## 2017-10-02 NOTE — Telephone Encounter (Signed)
Thank you :)

## 2017-10-03 ENCOUNTER — Ambulatory Visit: Payer: Self-pay | Admitting: Genetic Counselor

## 2017-10-03 ENCOUNTER — Ambulatory Visit
Admission: RE | Admit: 2017-10-03 | Discharge: 2017-10-03 | Disposition: A | Discharge status: Home / Self Care | Payer: Medicare Other | Source: Ambulatory Visit | Attending: Radiation Oncology | Admitting: Radiation Oncology

## 2017-10-03 DIAGNOSIS — Z170 Estrogen receptor positive status [ER+]: Secondary | ICD-10-CM

## 2017-10-03 DIAGNOSIS — M160 Bilateral primary osteoarthritis of hip: Secondary | ICD-10-CM | POA: Diagnosis not present

## 2017-10-03 DIAGNOSIS — Z510 Encounter for antineoplastic radiation therapy: Secondary | ICD-10-CM | POA: Diagnosis not present

## 2017-10-03 DIAGNOSIS — Z800 Family history of malignant neoplasm of digestive organs: Secondary | ICD-10-CM

## 2017-10-03 DIAGNOSIS — Z803 Family history of malignant neoplasm of breast: Secondary | ICD-10-CM | POA: Diagnosis not present

## 2017-10-03 DIAGNOSIS — Z1379 Encounter for other screening for genetic and chromosomal anomalies: Secondary | ICD-10-CM

## 2017-10-03 DIAGNOSIS — H409 Unspecified glaucoma: Secondary | ICD-10-CM | POA: Diagnosis not present

## 2017-10-03 DIAGNOSIS — C50411 Malignant neoplasm of upper-outer quadrant of right female breast: Secondary | ICD-10-CM

## 2017-10-03 NOTE — Progress Notes (Signed)
HPI: Ms. Holtrop was previously seen in the [facility] due to a personal and family history of cancer and concerns regarding a hereditary predisposition to cancer. Please refer to our prior cancer genetics clinic note for more information regarding Ms. Fiveash's medical, social and family histories, and our assessment and recommendations, at the time. Ms. Picazo recent genetic test results were disclosed to her, as were recommendations warranted by these results. These results and recommendations are discussed in more detail below.  CANCER HISTORY:    Malignant neoplasm of upper-outer quadrant of right breast in female, estrogen receptor positive ([place])   07/15/2017 Surgery    Right lumpectomy: Grade 2 IDC with DCIS, margins negative, invasive tumor 0.8 cm the remainder was DCIS, 0/7 lymph nodes negative, ER 100%, PR 95%, Ki-67 10%, HER-2 negative ratio 1.43, T1b N0 stage I a      09/30/2017 Genetic Testing    Negative genetic testing on the Multi-cancer panel.  The Multi-Gene Panel offered by [facility] includes sequencing and/or deletion duplication testing of the following 83 genes: ALK, APC, ATM, AXIN2,BAP1,  BARD1, BLM, BMPR1A, BRCA1, BRCA2, BRIP1, CASR, CDC73, CDH1, CDK4, CDKN1B, CDKN1C, CDKN2A (p14ARF), CDKN2A (p16INK4a), CEBPA, CHEK2, CTNNA1, DICER1, DIS3L2, EGFR (c.2369C>T, p.Thr790Met variant only), EPCAM (Deletion/duplication testing only), FH, FLCN, GATA2, GPC3, GREM1 (Promoter region deletion/duplication testing only), HOXB13 (c.251G>A, p.Gly84Glu), HRAS, KIT, MAX, MEN1, MET, MITF (c.952G>A, p.Glu318Lys variant only), MLH1, MSH2, MSH3, MSH6, MUTYH, NBN, NF1, NF2, NTHL1, PALB2, PDGFRA, PHOX2B, PMS2, POLD1, POLE, POT1, PRKAR1A, PTCH1, PTEN, RAD50, RAD51C, RAD51D, RB1, RECQL4, RET, RUNX1, SDHAF2, SDHA (sequence changes only), SDHB, SDHC, SDHD, SMAD4, SMARCA4, SMARCB1, SMARCE1, STK11, SUFU, TERT, TERT, TMEM127, TP53, TSC1, TSC2, VHL, WRN and WT1.  The report date is  September 30, 2017.         FAMILY HISTORY:  We obtained a detailed, 4-generation family history.  Significant diagnoses are listed below: Family History  Problem Relation Age of Onset  . Cancer Father 23       unk. primary; deceased 41  . Breast cancer Sister 49       currently 40; history of NHL  . Lung cancer Maternal Aunt        deceased 13; smoker  . Lung cancer Maternal Uncle        2 uncles; smokers; deceased  . Breast cancer Paternal Aunt 58       deceased 7s  . Cancer Paternal Uncle        unk. primary; deceased late 86s  . Breast cancer Paternal Grandmother        deceased 100  . Heart attack Paternal Grandfather   . Breast cancer Paternal Aunt 73       deceased 24s  . Colon cancer Paternal Aunt   . Breast cancer Cousin        pat cousin; daughter of an aunt w/ BC; dx 32s; deceased 7  . Cancer Maternal Aunt        unk. primary; deceased 6s  . Lung cancer Maternal Uncle        smoker  . Cancer Other 64       lymphoma in niece; daughter of her brother; deceased  . Dementia Mother   . Heart attack Brother   . Stroke Maternal Grandmother   . Lung cancer Maternal Grandfather        deceased at 63  . Cancer Paternal Uncle        unknown cancer  . Colon cancer Cousin  died in his early 106s  . Cancer Cousin        2 pat female cousins with unknown cancer  . Colon polyps Son 26       6 polyps    The patient has four children, three sons and a daughter.  All of her children are cancer free.  She has one brother and one sister.  Her brother died of a heart attack.  He had five children, one daughter had NHL and died at 53.  She has a sister who had NHL at 96 and breast cancer at 62.  Both parents are deceased.  The patient's mother died at 36 from old age.  She had three sisters and five brothers.  One brother died at 10 from a car accident, two brothers died of lung cancer from a result of smoking, two brothers had heat attacks. One of the brothers had a  daughter with an unknown form of cancer. Two sisters died of lung cancer from smoking and one sister died in childbirth. Both maternal grandparents are deceased.  The grandfather from lung cancer and the grandmother from an unknown form of cancer.  The patient's father died of metastatic cancer with an unknown primary.  He had three brothers and three sisters.  One sister had breast cancer and died in her 6's, another sister had both breast and colon cancer, and the third sister did not have cancer.  Two brothers had unknown forms of cancer. Several first cousins also had cancer, one with breast cancer, one with colon cancer and two with unknown forms of cancer.  The paternal grandparents are deceased.  The grandmother died of breast cancer and the grandfather died form a heart attack.  Ms. Shiplett is unaware of previous family history of genetic testing for hereditary cancer risks. Patient's maternal ancestors are of [place] descent, and paternal ancestors are of [place] descent. There is no reported Ashkenazi Jewish ancestry. There is no known consanguinity.  GENETIC TEST RESULTS: Genetic testing reported out on September 30, 2017 through the multi-cancer panel found no deleterious mutations.  The Multi-Gene Panel offered by [facility] includes sequencing and/or deletion duplication testing of the following 83 genes: ALK, APC, ATM, AXIN2,BAP1,  BARD1, BLM, BMPR1A, BRCA1, BRCA2, BRIP1, CASR, CDC73, CDH1, CDK4, CDKN1B, CDKN1C, CDKN2A (p14ARF), CDKN2A (p16INK4a), CEBPA, CHEK2, CTNNA1, DICER1, DIS3L2, EGFR (c.2369C>T, p.Thr790Met variant only), EPCAM (Deletion/duplication testing only), FH, FLCN, GATA2, GPC3, GREM1 (Promoter region deletion/duplication testing only), HOXB13 (c.251G>A, p.Gly84Glu), HRAS, KIT, MAX, MEN1, MET, MITF (c.952G>A, p.Glu318Lys variant only), MLH1, MSH2, MSH3, MSH6, MUTYH, NBN, NF1, NF2, NTHL1, PALB2, PDGFRA, PHOX2B, PMS2, POLD1, POLE, POT1, PRKAR1A, PTCH1, PTEN,  RAD50, RAD51C, RAD51D, RB1, RECQL4, RET, RUNX1, SDHAF2, SDHA (sequence changes only), SDHB, SDHC, SDHD, SMAD4, SMARCA4, SMARCB1, SMARCE1, STK11, SUFU, TERT, TERT, TMEM127, TP53, TSC1, TSC2, VHL, WRN and WT1.  The test report has been scanned into EPIC and is located under the Molecular Pathology section of the Results Review tab.   We discussed with Ms. Nipp that since the current genetic testing is not perfect, it is possible there may be a gene mutation in one of these genes that current testing cannot detect, but that chance is small. We also discussed, that it is possible that another gene that has not yet been discovered, or that we have not yet tested, is responsible for the cancer diagnoses in the family, and it is, therefore, important to remain in touch with cancer genetics in the future so that we  can continue to offer Ms. Mcroy the most up to date genetic testing.     CANCER SCREENING RECOMMENDATIONS: Given Ms. Kolar's personal and family histories, we must interpret these negative results with some caution.  Families with features suggestive of hereditary risk for cancer tend to have multiple family members with cancer, diagnoses in multiple generations and diagnoses before the age of 60. Ms. Demartini family exhibits some of these features. Thus this result may simply reflect our current inability to detect all mutations within these genes or there may be a different gene that has not yet been discovered or tested.   RECOMMENDATIONS FOR FAMILY MEMBERS: Women in this family might be at some increased risk of developing cancer, over the general population risk, simply due to the family history of cancer. We recommended women in this family have a yearly mammogram beginning at age 39, or 22 years younger than the earliest onset of cancer, an annual clinical breast exam, and perform monthly breast self-exams. Women in this family should also have a gynecological exam as recommended by  their primary provider. All family members should have a colonoscopy by age 61.  FOLLOW-UP: Lastly, we discussed with Ms. Eddie that cancer genetics is a rapidly advancing field and it is possible that new genetic tests will be appropriate for her and/or her family members in the future. We encouraged her to remain in contact with cancer genetics on an annual basis so we can update her personal and family histories and let her know of advances in cancer genetics that may benefit this family.   Our contact number was provided. Ms. Duce questions were answered to her satisfaction, and she knows she is welcome to call us at anytime with additional questions or concerns.   Roma Kayser, MS, [facility] Certified Genetic Counselor [email or web address]

## 2017-10-06 ENCOUNTER — Inpatient Hospital Stay: Payer: Medicare Other | Attending: Hematology and Oncology | Admitting: Hematology and Oncology

## 2017-10-06 ENCOUNTER — Other Ambulatory Visit: Payer: Self-pay

## 2017-10-06 ENCOUNTER — Ambulatory Visit
Admission: RE | Admit: 2017-10-06 | Discharge: 2017-10-06 | Disposition: A | Discharge status: Home / Self Care | Payer: Medicare Other | Source: Ambulatory Visit | Attending: Radiation Oncology | Admitting: Radiation Oncology

## 2017-10-06 ENCOUNTER — Telehealth: Payer: Self-pay | Admitting: Hematology and Oncology

## 2017-10-06 VITALS — BP 159/96 | HR 79 | Temp 98.6°F | Resp 20 | Ht 64.0 in | Wt 191.5 lb

## 2017-10-06 DIAGNOSIS — M160 Bilateral primary osteoarthritis of hip: Secondary | ICD-10-CM | POA: Diagnosis not present

## 2017-10-06 DIAGNOSIS — C50411 Malignant neoplasm of upper-outer quadrant of right female breast: Secondary | ICD-10-CM | POA: Insufficient documentation

## 2017-10-06 DIAGNOSIS — Z79899 Other long term (current) drug therapy: Secondary | ICD-10-CM | POA: Insufficient documentation

## 2017-10-06 DIAGNOSIS — Z79811 Long term (current) use of aromatase inhibitors: Secondary | ICD-10-CM | POA: Diagnosis not present

## 2017-10-06 DIAGNOSIS — Z170 Estrogen receptor positive status [ER+]: Secondary | ICD-10-CM | POA: Diagnosis not present

## 2017-10-06 DIAGNOSIS — Z803 Family history of malignant neoplasm of breast: Secondary | ICD-10-CM | POA: Diagnosis not present

## 2017-10-06 DIAGNOSIS — Z923 Personal history of irradiation: Secondary | ICD-10-CM | POA: Diagnosis not present

## 2017-10-06 DIAGNOSIS — H409 Unspecified glaucoma: Secondary | ICD-10-CM | POA: Diagnosis not present

## 2017-10-06 DIAGNOSIS — Z780 Asymptomatic menopausal state: Secondary | ICD-10-CM

## 2017-10-06 DIAGNOSIS — Z510 Encounter for antineoplastic radiation therapy: Secondary | ICD-10-CM | POA: Diagnosis not present

## 2017-10-06 MED ORDER — ANASTROZOLE 1 MG PO TABS: 1.0000 mg | ORAL_TABLET | Freq: Every day | ORAL | 1 refills | Status: DC

## 2017-10-06 NOTE — Progress Notes (Signed)
Patient Care Team: Cheyenne Bayley, DO as PCP - General (Family Medicine)  DIAGNOSIS:  Encounter Diagnoses  Name Primary?  . Malignant neoplasm of upper-outer quadrant of right breast in female, estrogen receptor positive ([place])   . Post-menopausal Yes    SUMMARY OF ONCOLOGIC HISTORY:   Malignant neoplasm of upper-outer quadrant of right breast in female, estrogen receptor positive ([place])   07/15/2017 Surgery    Right lumpectomy: Grade 2 IDC with DCIS, margins negative, invasive tumor 0.8 cm the remainder was DCIS, 0/7 lymph nodes negative, ER 100%, PR 95%, Ki-67 10%, HER-2 negative ratio 1.43, T1b N0 stage I a      07/22/2017 Oncotype testing    Oncotype DX score 2 (risk of distant recurrence 4%)      09/08/2017 - 10/06/2017 Radiation Therapy    Adjuvant radiation      09/30/2017 Genetic Testing    Negative genetic testing on the Multi-cancer panel.  The Multi-Gene Panel offered by [facility] includes sequencing and/or deletion duplication testing of the following 83 genes: ALK, APC, ATM, AXIN2,BAP1,  BARD1, BLM, BMPR1A, BRCA1, BRCA2, BRIP1, CASR, CDC73, CDH1, CDK4, CDKN1B, CDKN1C, CDKN2A (p14ARF), CDKN2A (p16INK4a), CEBPA, CHEK2, CTNNA1, DICER1, DIS3L2, EGFR (c.2369C>T, p.Thr790Met variant only), EPCAM (Deletion/duplication testing only), FH, FLCN, GATA2, GPC3, GREM1 (Promoter region deletion/duplication testing only), HOXB13 (c.251G>A, p.Gly84Glu), HRAS, KIT, MAX, MEN1, MET, MITF (c.952G>A, p.Glu318Lys variant only), MLH1, MSH2, MSH3, MSH6, MUTYH, NBN, NF1, NF2, NTHL1, PALB2, PDGFRA, PHOX2B, PMS2, POLD1, POLE, POT1, PRKAR1A, PTCH1, PTEN, RAD50, RAD51C, RAD51D, RB1, RECQL4, RET, RUNX1, SDHAF2, SDHA (sequence changes only), SDHB, SDHC, SDHD, SMAD4, SMARCA4, SMARCB1, SMARCE1, STK11, SUFU, TERT, TERT, TMEM127, TP53, TSC1, TSC2, VHL, WRN and WT1.  The report date is September 30, 2017.        10/06/2017 -  Anti-estrogen oral therapy    Anastrozole 1 mg daily        CHIEF COMPLIANT: Follow-up on  anastrozole therapy  INTERVAL HISTORY: Cheyenne Lee is a 77 year old with above-mentioned history of right breast cancer who underwent lumpectomy and radiation.  She is healing very well from the recent radiation.  She is here today to discuss starting antiestrogen therapy.   REVIEW OF SYSTEMS:   Constitutional: Denies fevers, chills or abnormal weight loss Eyes: Denies blurriness of vision Ears, nose, mouth, throat, and face: Denies mucositis or sore throat Respiratory: Denies cough, dyspnea or wheezes Cardiovascular: Denies palpitation, chest discomfort Gastrointestinal:  Denies nausea, heartburn or change in bowel habits Skin: Denies abnormal skin rashes Lymphatics: Denies new lymphadenopathy or easy bruising Neurological:Denies numbness, tingling or new weaknesses Behavioral/Psych: Mood is stable, no new changes  Extremities: No lower extremity edema Breast: Mild radiation dermatitis All other systems were reviewed with the patient and are negative.  I have reviewed the past medical history, past surgical history, social history and family history with the patient and they are unchanged from previous note.  ALLERGIES:  is allergic to codeine and tape.  MEDICATIONS:  Current Outpatient Medications  Medication Sig Dispense Refill  . dicyclomine (BENTYL) 10 MG capsule Take 10 mg by mouth as needed.     . fexofenadine (ALLEGRA) 180 MG tablet Take 180 mg by mouth as needed.     Marland Kitchen ibuprofen (ADVIL,MOTRIN) 200 MG tablet Take 200 mg by mouth every 6 (six) hours as needed.    . latanoprost (XALATAN) 0.005 % ophthalmic solution Place 1 drop into both eyes at bedtime.     . non-metallic deodorant Jethro Poling) MISC Apply 1 application topically daily as needed.    Marland Kitchen  triamterene-hydrochlorothiazide (MAXZIDE-25) 37.5-25 MG tablet Take 1 tablet by mouth daily. 37.5-25    . Wound Cleansers (RADIAPLEX EX) Apply 1 application topically 2 (two) times daily.     No current facility-administered  medications for this visit.     PHYSICAL EXAMINATION: ECOG PERFORMANCE STATUS: 1 - Symptomatic but completely ambulatory  Vitals:   10/06/17 1040  BP: (!) 159/96  Pulse: 79  Resp: 20  Temp: 98.6 F (37 C)  SpO2: 98%   Filed Weights   10/06/17 1040  Weight: 191 lb 8 oz (86.9 kg)    GENERAL:alert, no distress and comfortable SKIN: skin color, texture, turgor are normal, no rashes or significant lesions EYES: normal, Conjunctiva are pink and non-injected, sclera clear OROPHARYNX:no exudate, no erythema and lips, buccal mucosa, and tongue normal  NECK: supple, thyroid normal size, non-tender, without nodularity LYMPH:  no palpable lymphadenopathy in the cervical, axillary or inguinal LUNGS: clear to auscultation and percussion with normal breathing effort HEART: regular rate & rhythm and no murmurs and no lower extremity edema ABDOMEN:abdomen soft, non-tender and normal bowel sounds MUSCULOSKELETAL:no cyanosis of digits and no clubbing  NEURO: alert & oriented x 3 with fluent speech, no focal motor/sensory deficits EXTREMITIES: No lower extremity edema  LABORATORY DATA:  I have reviewed the data as listed CMP Latest Ref Rng & Units 07/10/2017  Glucose 65 - 99 mg/dL 142(H)  BUN 6 - 20 mg/dL 15  Creatinine 0.44 - 1.00 mg/dL 0.93  Sodium 135 - 145 mmol/L 137  Potassium 3.5 - 5.1 mmol/L 4.0  Chloride 101 - 111 mmol/L 102  CO2 22 - 32 mmol/L 24  Calcium 8.9 - 10.3 mg/dL 9.1    No results found for: WBC, HGB, HCT, MCV, PLT, NEUTROABS  ASSESSMENT & PLAN:  Malignant neoplasm of upper-outer quadrant of right breast in female, estrogen receptor positive ([facility]) Right lumpectomy: Grade 2 IDC with DCIS, margins negative, invasive tumor 0.8 cm the remainder was DCIS, 0/7 lymph nodes negative, ER 100%, PR 95%, Ki-67 10%, HER-2 negative ratio 1.43, T1b N0 stage I a Oncotype DX score 2:  4% rate of distant recurrence Adjuvant radiation therapy 09/08/2017-10/06/2017  Treatment plan:  Adjuvant antiestrogen therapy with anastrozole 1 mg daily times 5 years Anastrozole counseling: We discussed the risks and benefits of anti-estrogen therapy with aromatase inhibitors. These include but not limited to insomnia, hot flashes, mood changes, vaginal dryness, bone density loss, and weight gain. We strongly believe that the benefits far outweigh the risks. Patient understands these risks and consented to starting treatment. Planned treatment duration is 5 years.  Return to clinic in 3 months for survivorship care plan visit   I spent 25 minutes talking to the patient of which more than half was spent in counseling and coordination of care.  Orders Placed This Encounter  Procedures  . DG Bone Density    Standing Status:   Future    Standing Expiration Date:   10/06/2018    Order Specific Question:   Reason for Exam (SYMPTOM  OR DIAGNOSIS REQUIRED)    Answer:   Post menopausal osteoporosis evaluation    Order Specific Question:   Preferred imaging location?    Answer:   [facility]   The patient has a good understanding of the overall plan. she agrees with it. she will call with any problems that may develop before the next visit here.   [name], MD 10/06/17

## 2017-10-06 NOTE — Telephone Encounter (Signed)
Called pt to inform her, her script for anastrozole was sent to her [place] off of [address].  Cyndia Bent RN

## 2017-10-06 NOTE — Telephone Encounter (Signed)
Gave patient AVS and calendar of upcoming April appointments.  °

## 2017-10-06 NOTE — Assessment & Plan Note (Signed)
Right lumpectomy: Grade 2 IDC with DCIS, margins negative, invasive tumor 0.8 cm the remainder was DCIS, 0/7 lymph nodes negative, ER 100%, PR 95%, Ki-67 10%, HER-2 negative ratio 1.43, T1b N0 stage I a Oncotype DX score 2:  4% rate of distant recurrence Adjuvant radiation therapy 09/08/2017-10/06/2017  Treatment plan: Adjuvant antiestrogen therapy with anastrozole 1 mg daily times 5 years Anastrozole counseling: We discussed the risks and benefits of anti-estrogen therapy with aromatase inhibitors. These include but not limited to insomnia, hot flashes, mood changes, vaginal dryness, bone density loss, and weight gain. We strongly believe that the benefits far outweigh the risks. Patient understands these risks and consented to starting treatment. Planned treatment duration is 5 years.  Return to clinic in 3 months for survivorship care plan visit

## 2017-10-07 ENCOUNTER — Encounter: Payer: Self-pay | Admitting: Radiation Oncology

## 2017-10-07 ENCOUNTER — Ambulatory Visit
Admission: RE | Admit: 2017-10-07 | Discharge: 2017-10-07 | Disposition: A | Discharge status: Home / Self Care | Payer: Medicare Other | Source: Ambulatory Visit | Attending: Radiation Oncology | Admitting: Radiation Oncology

## 2017-10-07 DIAGNOSIS — C50411 Malignant neoplasm of upper-outer quadrant of right female breast: Secondary | ICD-10-CM | POA: Diagnosis not present

## 2017-10-07 DIAGNOSIS — Z803 Family history of malignant neoplasm of breast: Secondary | ICD-10-CM | POA: Diagnosis not present

## 2017-10-07 DIAGNOSIS — M160 Bilateral primary osteoarthritis of hip: Secondary | ICD-10-CM | POA: Diagnosis not present

## 2017-10-07 DIAGNOSIS — H409 Unspecified glaucoma: Secondary | ICD-10-CM | POA: Diagnosis not present

## 2017-10-07 DIAGNOSIS — Z510 Encounter for antineoplastic radiation therapy: Secondary | ICD-10-CM | POA: Diagnosis not present

## 2017-10-07 DIAGNOSIS — Z170 Estrogen receptor positive status [ER+]: Secondary | ICD-10-CM | POA: Diagnosis not present

## 2017-10-09 NOTE — Progress Notes (Signed)
  Radiation Oncology         [phone]) [phone] ________________________________  Name: Cheyenne Lee MRN: [id number]  Date: 10/07/2017  DOB: [birth year]  End of Treatment Note  Diagnosis:   77 y.o. female with Stage IA, pT1bN0M0 ER/PR positive, grade 2, invasive and in situ ductal carcinoma of the right breast    Indication for treatment:  Curative       Radiation treatment dates:   09/08/2017 - 10/07/2017  Site/dose:   The patient initially received a dose of 42.5 Gy in 17 fractions to the breast using whole-breast tangent fields. This was delivered using a 3-D conformal technique. The patient then received a boost to the seroma. This delivered an additional 7.5 Gy in 3 fractions using a 3 field photon technique due to the depth of the seroma. The total dose was 50 Gy.  Narrative: The patient tolerated radiation treatment relatively well.   The patient had some expected skin irritation as she progressed during treatment. Moist desquamation was not present at the end of treatment.  Plan: The patient has completed radiation treatment. The patient will return to radiation oncology clinic for routine followup in one month. I advised the patient to call or return sooner if they have any questions or concerns related to their recovery or treatment. ________________________________  Jodelle Gross, MD, PhD  This document serves as a record of services personally performed by Kyung Rudd, MD. It was created on his behalf by Rae Lips, a trained medical scribe. The creation of this record is based on the scribe's personal observations and the provider's statements to them. This document has been checked and approved by the attending provider.

## 2017-11-11 ENCOUNTER — Encounter: Payer: Self-pay | Admitting: Radiation Oncology

## 2017-11-11 ENCOUNTER — Ambulatory Visit
Admission: RE | Admit: 2017-11-11 | Discharge: 2017-11-11 | Disposition: A | Discharge status: Home / Self Care | Payer: Medicare Other | Source: Ambulatory Visit | Attending: Radiation Oncology | Admitting: Radiation Oncology

## 2017-11-11 ENCOUNTER — Other Ambulatory Visit: Payer: Self-pay

## 2017-11-11 VITALS — BP 136/76 | HR 81 | Temp 98.1°F | Resp 20 | Ht 64.0 in | Wt 193.6 lb

## 2017-11-11 DIAGNOSIS — Z79899 Other long term (current) drug therapy: Secondary | ICD-10-CM | POA: Diagnosis not present

## 2017-11-11 DIAGNOSIS — Z170 Estrogen receptor positive status [ER+]: Secondary | ICD-10-CM

## 2017-11-11 DIAGNOSIS — Z923 Personal history of irradiation: Secondary | ICD-10-CM | POA: Diagnosis not present

## 2017-11-11 DIAGNOSIS — D0511 Intraductal carcinoma in situ of right breast: Secondary | ICD-10-CM | POA: Insufficient documentation

## 2017-11-11 DIAGNOSIS — C50411 Malignant neoplasm of upper-outer quadrant of right female breast: Secondary | ICD-10-CM

## 2017-11-11 NOTE — Progress Notes (Signed)
Radiation Oncology         [phone]) [phone] ________________________________  Name: Cheyenne Lee MRN: [id number]  Date of Service: 11/11/2017  DOB: 10-Dec-1940  Post Treatment Note  CC: Glenford Bayley, DO  Donnie Mesa, MD  Diagnosis:   Stage IA, pT1bN0M0 ER/PR positive, grade 2,invasive and in situ ductal carcinoma of the right breast     Interval Since Last Radiation: 5 weeks   09/08/2017 - 10/07/2017: The patient initially received a dose of 42.5 Gy in 17 fractions to the breast using whole-breast tangent fields. This was delivered using a 3-D conformal technique. The patient then received a boost to the seroma. This delivered an additional 7.5 Gy in 3 fractions using a 3 field photon technique due to the depth of the seroma. The total dose was 50 Gy   Narrative:  The patient returns today for routine follow-up. During treatment she did very well with radiotherapy and did not have significant desquamation.                             On review of systems, the patient states she is doing well overall. She reports she's not having any trouble with skin irritation but her skin is still hyperpigmented.   ALLERGIES:  is allergic to codeine and tape.  Meds: Current Outpatient Medications  Medication Sig Dispense Refill  . anastrozole (ARIMIDEX) 1 MG tablet Take 1 tablet (1 mg total) by mouth daily. 30 tablet 1  . dicyclomine (BENTYL) 10 MG capsule Take 10 mg by mouth as needed.     Marland Kitchen ibuprofen (ADVIL,MOTRIN) 200 MG tablet Take 200 mg by mouth every 6 (six) hours as needed.    . latanoprost (XALATAN) 0.005 % ophthalmic solution Place 1 drop into both eyes at bedtime.     . triamterene-hydrochlorothiazide (MAXZIDE-25) 37.5-25 MG tablet Take 1 tablet by mouth daily. 37.5-25    . fexofenadine (ALLEGRA) 180 MG tablet Take 180 mg by mouth as needed.      No current facility-administered medications for this encounter.     Physical Findings:  height is 5\' 4"  (1.626 m) and weight is 193 lb  9.6 oz (87.8 kg). Her oral temperature is 98.1 F (36.7 C). Her blood pressure is 136/76 and her pulse is 81. Her respiration is 20 and oxygen saturation is 99%.  Pain Assessment Pain Score: 0-No pain/10 In general this is a well appearing caucasian female in no acute distress. She's alert and oriented x4 and appropriate throughout the examination. Cardiopulmonary assessment is negative for acute distress and she exhibits normal effort. The right breast was examined and reveals mild hyperpigmentation without desquamation.   Lab Findings: No results found for: WBC, HGB, HCT, MCV, PLT   Radiographic Findings: No results found.  Impression/Plan: 1. Stage IA, pT1bN0M0 ER/PR positive, grade 2,invasive and in situ ductal carcinoma of the right breast. The patient has been doing well since completion of radiotherapy. We discussed that we would be happy to continue to follow her as needed, but she will also continue to follow up with Dr. Lindi Adie in medical oncology. She was counseled on skin care as well as measures to avoid sun exposure to this area.  2. Survivorship. She is scheduled for survivorship evaluation in May. We also reviewed the cancer center resources available to her, and she is interested in scheduling her appointment for bone density scan. I will contact Dr. Lindi Adie regarding this as well.  Carola Rhine, PAC

## 2017-11-12 ENCOUNTER — Telehealth: Payer: Self-pay

## 2017-11-12 NOTE — Telephone Encounter (Signed)
Called pt and informed her that her orders for Bone Density were in and she could call to schedule.  Cyndia Bent RN

## 2017-11-18 ENCOUNTER — Encounter: Payer: Self-pay | Admitting: Radiation Oncology

## 2017-11-18 NOTE — Progress Notes (Signed)
Simulation verification 09/26/2017  The patient was brought to the treatment machine and placed in the plan treatment position.  Clinical set up was verified to ensure that the target region is appropriately covered for the patient's upcoming electron boost treatment.  The targeted volume of tissue is appropriately covered by the radiation field.  Based on my personal review, I approve the simulation verification.  The patient's treatment will proceed as planned.  ------------------------------------------------ -----------------------------------  Blair Promise, PhD, MD

## 2017-11-21 DIAGNOSIS — C50911 Malignant neoplasm of unspecified site of right female breast: Secondary | ICD-10-CM | POA: Diagnosis not present

## 2017-11-24 ENCOUNTER — Other Ambulatory Visit: Payer: Self-pay | Admitting: Hematology and Oncology

## 2017-11-24 DIAGNOSIS — E2839 Other primary ovarian failure: Secondary | ICD-10-CM

## 2017-11-26 ENCOUNTER — Ambulatory Visit
Admission: RE | Admit: 2017-11-26 | Discharge: 2017-11-26 | Disposition: A | Discharge status: Home / Self Care | Payer: Medicare Other | Source: Ambulatory Visit | Attending: Hematology and Oncology | Admitting: Hematology and Oncology

## 2017-11-26 ENCOUNTER — Telehealth: Payer: Self-pay

## 2017-11-26 ENCOUNTER — Inpatient Hospital Stay: Admission: RE | Admit: 2017-11-26 | Payer: Medicare Other | Source: Ambulatory Visit

## 2017-11-26 DIAGNOSIS — Z13820 Encounter for screening for osteoporosis: Secondary | ICD-10-CM | POA: Diagnosis not present

## 2017-11-26 DIAGNOSIS — Z780 Asymptomatic menopausal state: Secondary | ICD-10-CM | POA: Diagnosis not present

## 2017-11-26 DIAGNOSIS — E2839 Other primary ovarian failure: Secondary | ICD-10-CM

## 2017-11-26 NOTE — Telephone Encounter (Signed)
Called with below message. Verbalized understanding. 

## 2017-11-26 NOTE — Telephone Encounter (Signed)
-----   Message from Gardenia Phlegm, NP sent at 11/26/2017  2:14 PM EST ----- Please review that bone density was NORMAL with patient. ----- Message ----- From: Interface, Rad Results In Sent: 11/26/2017  11:10 AM To: Nicholas Lose, MD

## 2017-11-27 ENCOUNTER — Other Ambulatory Visit: Payer: Self-pay

## 2017-11-27 MED ORDER — ANASTROZOLE 1 MG PO TABS: 1.0000 mg | ORAL_TABLET | Freq: Every day | ORAL | 3 refills | Status: DC

## 2017-12-01 ENCOUNTER — Other Ambulatory Visit: Payer: Self-pay

## 2017-12-01 MED ORDER — ANASTROZOLE 1 MG PO TABS: 1.0000 mg | ORAL_TABLET | Freq: Every day | ORAL | 3 refills | Status: DC

## 2017-12-18 DIAGNOSIS — Z23 Encounter for immunization: Secondary | ICD-10-CM | POA: Diagnosis not present

## 2017-12-18 DIAGNOSIS — I10 Essential (primary) hypertension: Secondary | ICD-10-CM | POA: Diagnosis not present

## 2017-12-18 DIAGNOSIS — E782 Mixed hyperlipidemia: Secondary | ICD-10-CM | POA: Diagnosis not present

## 2017-12-29 DIAGNOSIS — Z961 Presence of intraocular lens: Secondary | ICD-10-CM | POA: Diagnosis not present

## 2017-12-29 DIAGNOSIS — H5213 Myopia, bilateral: Secondary | ICD-10-CM | POA: Diagnosis not present

## 2017-12-29 DIAGNOSIS — H43813 Vitreous degeneration, bilateral: Secondary | ICD-10-CM | POA: Diagnosis not present

## 2017-12-29 DIAGNOSIS — H04123 Dry eye syndrome of bilateral lacrimal glands: Secondary | ICD-10-CM | POA: Diagnosis not present

## 2017-12-29 DIAGNOSIS — H16223 Keratoconjunctivitis sicca, not specified as Sjogren's, bilateral: Secondary | ICD-10-CM | POA: Diagnosis not present

## 2017-12-29 DIAGNOSIS — H02831 Dermatochalasis of right upper eyelid: Secondary | ICD-10-CM | POA: Diagnosis not present

## 2017-12-29 DIAGNOSIS — H52203 Unspecified astigmatism, bilateral: Secondary | ICD-10-CM | POA: Diagnosis not present

## 2017-12-29 DIAGNOSIS — H02834 Dermatochalasis of left upper eyelid: Secondary | ICD-10-CM | POA: Diagnosis not present

## 2017-12-29 DIAGNOSIS — H524 Presbyopia: Secondary | ICD-10-CM | POA: Diagnosis not present

## 2017-12-29 DIAGNOSIS — H401223 Low-tension glaucoma, left eye, severe stage: Secondary | ICD-10-CM | POA: Diagnosis not present

## 2017-12-29 DIAGNOSIS — H401211 Low-tension glaucoma, right eye, mild stage: Secondary | ICD-10-CM | POA: Diagnosis not present

## 2018-01-21 ENCOUNTER — Telehealth: Payer: Self-pay

## 2018-01-21 NOTE — Telephone Encounter (Signed)
Spoke with patient reminding of SCP visit with NP on 01/26/18 at 10 am.  Patient says she will come to appt.

## 2018-01-26 ENCOUNTER — Encounter: Payer: Self-pay | Admitting: Adult Health

## 2018-01-26 ENCOUNTER — Inpatient Hospital Stay: Payer: Medicare Other | Attending: Adult Health | Admitting: Adult Health

## 2018-01-26 ENCOUNTER — Telehealth: Payer: Self-pay | Admitting: Hematology and Oncology

## 2018-01-26 VITALS — BP 160/97 | HR 75 | Temp 99.1°F | Resp 18 | Ht 64.0 in | Wt 193.5 lb

## 2018-01-26 DIAGNOSIS — Z800 Family history of malignant neoplasm of digestive organs: Secondary | ICD-10-CM | POA: Insufficient documentation

## 2018-01-26 DIAGNOSIS — Z87891 Personal history of nicotine dependence: Secondary | ICD-10-CM | POA: Diagnosis not present

## 2018-01-26 DIAGNOSIS — Z923 Personal history of irradiation: Secondary | ICD-10-CM | POA: Diagnosis not present

## 2018-01-26 DIAGNOSIS — Z823 Family history of stroke: Secondary | ICD-10-CM

## 2018-01-26 DIAGNOSIS — Z170 Estrogen receptor positive status [ER+]: Secondary | ICD-10-CM | POA: Diagnosis not present

## 2018-01-26 DIAGNOSIS — Z803 Family history of malignant neoplasm of breast: Secondary | ICD-10-CM | POA: Insufficient documentation

## 2018-01-26 DIAGNOSIS — Z801 Family history of malignant neoplasm of trachea, bronchus and lung: Secondary | ICD-10-CM | POA: Insufficient documentation

## 2018-01-26 DIAGNOSIS — R5383 Other fatigue: Secondary | ICD-10-CM | POA: Diagnosis not present

## 2018-01-26 DIAGNOSIS — M791 Myalgia, unspecified site: Secondary | ICD-10-CM | POA: Insufficient documentation

## 2018-01-26 DIAGNOSIS — C50411 Malignant neoplasm of upper-outer quadrant of right female breast: Secondary | ICD-10-CM

## 2018-01-26 DIAGNOSIS — M129 Arthropathy, unspecified: Secondary | ICD-10-CM | POA: Insufficient documentation

## 2018-01-26 DIAGNOSIS — Z79811 Long term (current) use of aromatase inhibitors: Secondary | ICD-10-CM | POA: Insufficient documentation

## 2018-01-26 DIAGNOSIS — K589 Irritable bowel syndrome without diarrhea: Secondary | ICD-10-CM | POA: Insufficient documentation

## 2018-01-26 DIAGNOSIS — Z79899 Other long term (current) drug therapy: Secondary | ICD-10-CM | POA: Diagnosis not present

## 2018-01-26 NOTE — Progress Notes (Signed)
CLINIC:  Survivorship   REASON FOR VISIT:  Routine follow-up post-treatment for a recent history of breast cancer.  BRIEF ONCOLOGIC HISTORY:    Malignant neoplasm of upper-outer quadrant of right breast in female, estrogen receptor positive ([place])   07/15/2017 Surgery    Right lumpectomy: Grade 2 IDC with DCIS, margins negative, invasive tumor 0.8 cm the remainder was DCIS, 0/7 lymph nodes negative, ER 100%, PR 95%, Ki-67 10%, HER-2 negative ratio 1.43, T1b N0 stage I a      07/22/2017 Oncotype testing    Oncotype DX score 2 (risk of distant recurrence 4%)      09/08/2017 - 10/06/2017 Radiation Therapy    Adjuvant radiation      09/30/2017 Genetic Testing    Negative genetic testing on the Multi-cancer panel.  The Multi-Gene Panel offered by [facility] includes sequencing and/or deletion duplication testing of the following 83 genes: ALK, APC, ATM, AXIN2,BAP1,  BARD1, BLM, BMPR1A, BRCA1, BRCA2, BRIP1, CASR, CDC73, CDH1, CDK4, CDKN1B, CDKN1C, CDKN2A (p14ARF), CDKN2A (p16INK4a), CEBPA, CHEK2, CTNNA1, DICER1, DIS3L2, EGFR (c.2369C>T, p.Thr790Met variant only), EPCAM (Deletion/duplication testing only), FH, FLCN, GATA2, GPC3, GREM1 (Promoter region deletion/duplication testing only), HOXB13 (c.251G>A, p.Gly84Glu), HRAS, KIT, MAX, MEN1, MET, MITF (c.952G>A, p.Glu318Lys variant only), MLH1, MSH2, MSH3, MSH6, MUTYH, NBN, NF1, NF2, NTHL1, PALB2, PDGFRA, PHOX2B, PMS2, POLD1, POLE, POT1, PRKAR1A, PTCH1, PTEN, RAD50, RAD51C, RAD51D, RB1, RECQL4, RET, RUNX1, SDHAF2, SDHA (sequence changes only), SDHB, SDHC, SDHD, SMAD4, SMARCA4, SMARCB1, SMARCE1, STK11, SUFU, TERT, TERT, TMEM127, TP53, TSC1, TSC2, VHL, WRN and WT1.  The report date is September 30, 2017.        11/06/2017 -  Anti-estrogen oral therapy    Anastrozole 1 mg daily        INTERVAL HISTORY:  Cheyenne Lee presents to the [facility] today for our initial meeting to review her survivorship care plan detailing her treatment course for  breast cancer, as well as monitoring long-term side effects of that treatment, education regarding health maintenance, screening, and overall wellness and health promotion.     Overall, Cheyenne Lee reports feeling moderately well.  She is concerned about the Anastrozole and achiness since starting the medication.  She says it is slightly better than it was initially.  The achiness is mainly in joints, particularly knees and sometimes the shoulders.  She says that everything else is going well.  Her fatigue is present, but improving.      REVIEW OF SYSTEMS:  Review of Systems  Constitutional: Negative for appetite change, chills, fatigue, fever and unexpected weight change.  HENT:   Negative for hearing loss, lump/mass, sore throat and trouble swallowing.   Eyes: Negative for eye problems and icterus.  Respiratory: Negative for chest tightness, cough and shortness of breath.   Cardiovascular: Negative for chest pain, leg swelling and palpitations.  Gastrointestinal: Negative for abdominal distention, abdominal pain, constipation, diarrhea, nausea and vomiting.  Endocrine: Negative for hot flashes.  Musculoskeletal: Positive for arthralgias.  Skin: Negative for itching and rash.  Neurological: Negative for dizziness, extremity weakness, headaches and numbness.  Hematological: Negative for adenopathy. Does not bruise/bleed easily.  Psychiatric/Behavioral: Negative for depression. The patient is not nervous/anxious.   Breast: Denies any new nodularity, masses, tenderness, nipple changes, or nipple discharge.      ONCOLOGY TREATMENT TEAM:  1. Surgeon:  Dr. Georgette Dover at [facility] 2. Medical Oncologist: Dr. Lindi Adie  3. Radiation Oncologist: Dr. Lisbeth Renshaw    PAST MEDICAL/SURGICAL HISTORY:  Past Medical History:  Diagnosis Date  .  Arthritis    hips  . Breast cancer ([place]) 06/25/2017   Right breast  . Cancer ([place])    right breast DCIS  . Complication of anesthesia   . Family  history of breast cancer   . Family history of colon cancer   . Family history of malignant neoplasm of breast   . Glaucoma   . H/O seasonal allergies   . Hypertension   . IBS (irritable bowel syndrome)   . PONV (postoperative nausea and vomiting)    Past Surgical History:  Procedure Laterality Date  . AXILLARY LYMPH NODE DISSECTION Right 07/29/2017   Procedure: AXILLARY LYMPH NODE DISSECTION;  Surgeon: Donnie Mesa, MD;  Location: [facility];  Service: General;  Laterality: Right;  . AXILLARY SENTINEL NODE BIOPSY Right 07/29/2017   Procedure: RIGHT AXILLARY SENTINEL NODE BIOPSY;  Surgeon: Donnie Mesa, MD;  Location: [facility];  Service: General;  Laterality: Right;  . BREAST BIOPSY     Multiple Biopsies, both breasts  . BREAST LUMPECTOMY WITH RADIOACTIVE SEED LOCALIZATION Right 07/15/2017   Procedure: RIGHT BREAST LUMPECTOMY WITH RADIOACTIVE SEED LOCALIZATION ERAS PATHWAY;  Surgeon: Donnie Mesa, MD;  Location: [facility];  Service: General;  Laterality: Right;  LMA  . CESAREAN SECTION     x4     ALLERGIES:  Allergies  Allergen Reactions  . Codeine     REACTION: anaphylaxis  . Tape Rash     CURRENT MEDICATIONS:  Outpatient Encounter Medications as of 01/26/2018  Medication Sig  . anastrozole (ARIMIDEX) 1 MG tablet Take 1 tablet (1 mg total) by mouth daily.  Marland Kitchen dicyclomine (BENTYL) 10 MG capsule Take 10 mg by mouth as needed.   . fexofenadine (ALLEGRA) 180 MG tablet Take 180 mg by mouth as needed.   Marland Kitchen ibuprofen (ADVIL,MOTRIN) 200 MG tablet Take 200 mg by mouth every 6 (six) hours as needed.  . latanoprost (XALATAN) 0.005 % ophthalmic solution Place 1 drop into both eyes at bedtime.   . triamterene-hydrochlorothiazide (MAXZIDE-25) 37.5-25 MG tablet Take 1 tablet by mouth daily. 37.5-25   No facility-administered encounter medications on file as of 01/26/2018.      ONCOLOGIC FAMILY HISTORY:  Family History  Problem Relation  Age of Onset  . Cancer Father 15       unk. primary; deceased 96  . Breast cancer Sister 67       currently 30; history of NHL  . Lung cancer Maternal Aunt        deceased 36; smoker  . Lung cancer Maternal Uncle        2 uncles; smokers; deceased  . Breast cancer Paternal Aunt 31       deceased 46s  . Cancer Paternal Uncle        unk. primary; deceased late 2s  . Breast cancer Paternal Grandmother        deceased 72  . Heart attack Paternal Grandfather   . Breast cancer Paternal Aunt 24       deceased 20s  . Colon cancer Paternal Aunt   . Breast cancer Cousin        pat cousin; daughter of an aunt w/ BC; dx 6s; deceased 55  . Cancer Maternal Aunt        unk. primary; deceased 63s  . Lung cancer Maternal Uncle        smoker  . Cancer Other 33       lymphoma in niece; daughter of her brother; deceased  .  Dementia Mother   . Heart attack Brother   . Stroke Maternal Grandmother   . Lung cancer Maternal Grandfather        deceased at 53  . Cancer Paternal Uncle        unknown cancer  . Colon cancer Cousin        died in his early 69s  . Cancer Cousin        2 pat female cousins with unknown cancer  . Colon polyps Son 34       6 polyps      SOCIAL HISTORY:  Social History   Socioeconomic History  . Marital status: Married    Spouse name: Not on file  . Number of children: Not on file  . Years of education: Not on file  . Highest education level: Not on file  Occupational History  . Not on file  Social Needs  . Financial resource strain: Not on file  . Food insecurity:    Worry: Not on file    Inability: Not on file  . Transportation needs:    Medical: Not on file    Non-medical: Not on file  Tobacco Use  . Smoking status: Former Smoker    Types: Cigarettes  . Smokeless tobacco: Never Used  . Tobacco comment: was a social smoker but quit in the 1960s  Substance and Sexual Activity  . Alcohol use: Yes    Comment: social  . Drug use: No  . Sexual  activity: Not on file  Lifestyle  . Physical activity:    Days per week: Not on file    Minutes per session: Not on file  . Stress: Not on file  Relationships  . Social connections:    Talks on phone: Not on file    Gets together: Not on file    Attends religious service: Not on file    Active member of club or organization: Not on file    Attends meetings of clubs or organizations: Not on file    Relationship status: Not on file  . Intimate partner violence:    Fear of current or ex partner: Not on file    Emotionally abused: Not on file    Physically abused: Not on file    Forced sexual activity: Not on file  Other Topics Concern  . Not on file  Social History Narrative  . Not on file      PHYSICAL EXAMINATION:  Vital Signs:   Vitals:   01/26/18 0928  BP: (!) 160/97  Pulse: 75  Resp: 18  Temp: 99.1 F (37.3 C)  SpO2: 99%   Filed Weights   01/26/18 0928  Weight: 193 lb 8 oz (87.8 kg)   General: Well-nourished, well-appearing female in no acute distress.  She is unaccompanied today.   HEENT: Head is normocephalic.  Pupils equal and reactive to light. Conjunctivae clear without exudate.  Sclerae anicteric. Oral mucosa is pink, moist.  Oropharynx is pink without lesions or erythema.  Lymph: No cervical, supraclavicular, or infraclavicular lymphadenopathy noted on palpation.  Cardiovascular: Regular rate and rhythm.Marland Kitchen Respiratory: Clear to auscultation bilaterally. Chest expansion symmetric; breathing non-labored.  Breasts: right breast s/p lumpectomy, hyperpigmentation to skin noted, mild tenderness, no nodules, masses, or sign of recurrence, left breast without nodules, masses, skin or nipple changes.   GI: Abdomen soft and round; non-tender, non-distended. Bowel sounds normoactive.  GU: Deferred.  Neuro: No focal deficits. Steady gait.  Psych: Mood and affect normal and appropriate  for situation.  Extremities: No edema. MSK: No focal spinal tenderness to  palpation.  Full range of motion in bilateral upper extremities Skin: Warm and dry.  LABORATORY DATA:  None for this visit.  DIAGNOSTIC IMAGING:  None for this visit.      ASSESSMENT AND PLAN:  Cheyenne Lee is a pleasant 77 y.o. female with Stage IA right breast invasive ductal carcinoma, ER+/PR+/HER2-, diagnosed in 06/2017, treated with lumpectomy, adjuvant radiation therapy, and anti-estrogen therapy with Anastrozole beginning in 09/2017.  She presents to the Survivorship Clinic for our initial meeting and routine follow-up post-completion of treatment for breast cancer.    1. Stage IA right breast cancer:  Cheyenne Lee is continuing to recover from definitive treatment for breast cancer. She will follow-up with her medical oncologist, Dr. Lindi Adie with history and physical exam per surveillance protocol.  She will continue her anti-estrogen therapy with Anastrozole. Thus far, she is tolerating the Anastrozole well, with minimal side effects (see #2). Today, a comprehensive survivorship care plan and treatment summary was reviewed with the patient today detailing her breast cancer diagnosis, treatment course, potential late/long-term effects of treatment, appropriate follow-up care with recommendations for the future, and patient education resources.  A copy of this summary, along with a letter will be sent to the patient's primary care provider via mail/fax/In Basket message after today's visit.    2. Arthralgias: She will stay on the Anastrozole, as these are improving.  I recommended yoga, tai chi, and tylenol if needed.  If they are persistent she will discuss at her appt with Dr. Lindi Adie in 3 months.    3. Bone health:  Given Cheyenne Lee's age/history of breast cancer and her current treatment regimen including anti-estrogen therapy withAnastrozole, she is at risk for bone demineralization.  Her last DEXA scan was 11/26/2017 and was normal.  In the meantime, she was encouraged to increase her  consumption of foods rich in calcium, as well as increase her weight-bearing activities.  She was given education on specific activities to promote bone health.  4. Cancer screening:  Due to Cheyenne Lee's history and her age, she should receive screening for skin cancers, colon cancer, and gynecologic cancers.  The information and recommendations are listed on the patient's comprehensive care plan/treatment summary and were reviewed in detail with the patient.    5. Health maintenance and wellness promotion: Cheyenne Lee was encouraged to consume 5-7 servings of fruits and vegetables per day. We reviewed the "Nutrition Rainbow" handout, as well as the handout "Take Control of Your Health and Reduce Your Cancer Risk" from the [place].  She was also encouraged to engage in moderate to vigorous exercise for 30 minutes per day most days of the week. We discussed the [employer] fitness program, which is designed for cancer survivors to help them become more physically fit after cancer treatments.  She was instructed to limit her alcohol consumption and continue to abstain from tobacco use.     6. Support services/counseling: It is not uncommon for this period of the patient's cancer care trajectory to be one of many emotions and stressors.  We discussed an opportunity for her to participate in the next session of [facility] ("Finding Your New Normal") support group series designed for patients after they have completed treatment.   Cheyenne Lee was encouraged to take advantage of our many other support services programs, support groups, and/or counseling in coping with her new life as a cancer survivor after completing anti-cancer treatment.  She was offered support today through active listening and expressive supportive counseling.  She was given information regarding our available services and encouraged to contact me with any questions or for help enrolling in any of our support group/programs.      Dispo:   -Return to cancer center in 3 months for follow up with Dr. Lindi Adie  -Mammogram due in 05/2018  -Bone density 11/2019 -Follow up with Dr. Georgette Dover in 4 months -She is welcome to return back to the Survivorship Clinic at any time; no additional follow-up needed at this time.  -Consider referral back to survivorship as a long-term survivor for continued surveillance  A total of (30) minutes of face-to-face time was spent with this patient with greater than 50% of that time in counseling and care-coordination.   Gardenia Phlegm, NP Survivorship Program [facility] [phone]   Note: PRIMARY CARE PROVIDER Glenford Bayley, [place]

## 2018-01-26 NOTE — Telephone Encounter (Signed)
Gave avs and calendar ° °

## 2018-02-24 DIAGNOSIS — D229 Melanocytic nevi, unspecified: Secondary | ICD-10-CM | POA: Diagnosis not present

## 2018-02-24 DIAGNOSIS — L821 Other seborrheic keratosis: Secondary | ICD-10-CM | POA: Diagnosis not present

## 2018-04-28 ENCOUNTER — Inpatient Hospital Stay: Payer: Medicare Other | Attending: Adult Health | Admitting: Hematology and Oncology

## 2018-04-28 ENCOUNTER — Telehealth: Payer: Self-pay | Admitting: Hematology and Oncology

## 2018-04-28 DIAGNOSIS — Z923 Personal history of irradiation: Secondary | ICD-10-CM | POA: Diagnosis not present

## 2018-04-28 DIAGNOSIS — Z170 Estrogen receptor positive status [ER+]: Secondary | ICD-10-CM | POA: Diagnosis not present

## 2018-04-28 DIAGNOSIS — Z79811 Long term (current) use of aromatase inhibitors: Secondary | ICD-10-CM | POA: Diagnosis not present

## 2018-04-28 DIAGNOSIS — C50411 Malignant neoplasm of upper-outer quadrant of right female breast: Secondary | ICD-10-CM

## 2018-04-28 DIAGNOSIS — Z79899 Other long term (current) drug therapy: Secondary | ICD-10-CM | POA: Diagnosis not present

## 2018-04-28 MED ORDER — ANASTROZOLE 1 MG PO TABS: 1.0000 mg | ORAL_TABLET | Freq: Every day | ORAL | 3 refills | Status: DC

## 2018-04-28 NOTE — Progress Notes (Signed)
Patient Care Team: Glenford Bayley, DO as PCP - General (Family Medicine) Nicholas Lose, MD as Consulting Physician (Hematology and Oncology) Kyung Rudd, MD as Consulting Physician (Radiation Oncology) Donnie Mesa, MD as Consulting Physician (General Surgery) Delice Bison Charlestine Massed, NP as Nurse Practitioner (Hematology and Oncology)  DIAGNOSIS:  Encounter Diagnosis  Name Primary?  . Malignant neoplasm of upper-outer quadrant of right breast in female, estrogen receptor positive ([place])     SUMMARY OF ONCOLOGIC HISTORY:   Malignant neoplasm of upper-outer quadrant of right breast in female, estrogen receptor positive ([place])   07/15/2017 Surgery    Right lumpectomy: Grade 2 IDC with DCIS, margins negative, invasive tumor 0.8 cm the remainder was DCIS, 0/7 lymph nodes negative, ER 100%, PR 95%, Ki-67 10%, HER-2 negative ratio 1.43, T1b N0 stage I a      07/22/2017 Oncotype testing    Oncotype DX score 2 (risk of distant recurrence 4%)      09/08/2017 - 10/06/2017 Radiation Therapy    Adjuvant radiation      09/30/2017 Genetic Testing    Negative genetic testing on the Multi-cancer panel.  The Multi-Gene Panel offered by [facility] includes sequencing and/or deletion duplication testing of the following 83 genes: ALK, APC, ATM, AXIN2,BAP1,  BARD1, BLM, BMPR1A, BRCA1, BRCA2, BRIP1, CASR, CDC73, CDH1, CDK4, CDKN1B, CDKN1C, CDKN2A (p14ARF), CDKN2A (p16INK4a), CEBPA, CHEK2, CTNNA1, DICER1, DIS3L2, EGFR (c.2369C>T, p.Thr790Met variant only), EPCAM (Deletion/duplication testing only), FH, FLCN, GATA2, GPC3, GREM1 (Promoter region deletion/duplication testing only), HOXB13 (c.251G>A, p.Gly84Glu), HRAS, KIT, MAX, MEN1, MET, MITF (c.952G>A, p.Glu318Lys variant only), MLH1, MSH2, MSH3, MSH6, MUTYH, NBN, NF1, NF2, NTHL1, PALB2, PDGFRA, PHOX2B, PMS2, POLD1, POLE, POT1, PRKAR1A, PTCH1, PTEN, RAD50, RAD51C, RAD51D, RB1, RECQL4, RET, RUNX1, SDHAF2, SDHA (sequence changes only), SDHB, SDHC, SDHD, SMAD4,  SMARCA4, SMARCB1, SMARCE1, STK11, SUFU, TERT, TERT, TMEM127, TP53, TSC1, TSC2, VHL, WRN and WT1.  The report date is September 30, 2017.        11/06/2017 -  Anti-estrogen oral therapy    Anastrozole 1 mg daily        CHIEF COMPLIANT: Follow-up on anastrozole therapy  INTERVAL HISTORY: Cheyenne Lee is a 77 year old with above-mentioned history of right breast cancer treated with lumpectomy radiation is currently on anastrozole therapy.  She appears to be tolerating it extremely well.  She denies any lumps or nodules in the breast.  She is recovered very well from the prior surgery and radiation.  She has not been exercising.  She has gained some weight.  REVIEW OF SYSTEMS:   Constitutional: Denies fevers, chills or abnormal weight loss Eyes: Denies blurriness of vision Ears, nose, mouth, throat, and face: Denies mucositis or sore throat Respiratory: Denies cough, dyspnea or wheezes Cardiovascular: Denies palpitation, chest discomfort Gastrointestinal:  Denies nausea, heartburn or change in bowel habits Skin: Denies abnormal skin rashes Lymphatics: Denies new lymphadenopathy or easy bruising Neurological:Denies numbness, tingling or new weaknesses Behavioral/Psych: Mood is stable, no new changes  Extremities: No lower extremity edema Breast:  denies any pain or lumps or nodules in either breasts All other systems were reviewed with the patient and are negative.  I have reviewed the past medical history, past surgical history, social history and family history with the patient and they are unchanged from previous note.  ALLERGIES:  is allergic to codeine and tape.  MEDICATIONS:  Current Outpatient Medications  Medication Sig Dispense Refill  . anastrozole (ARIMIDEX) 1 MG tablet Take 1 tablet (1 mg total) by mouth daily. 90 tablet 3  .  dicyclomine (BENTYL) 10 MG capsule Take 10 mg by mouth as needed.     . fexofenadine (ALLEGRA) 180 MG tablet Take 180 mg by mouth as needed.     Marland Kitchen  ibuprofen (ADVIL,MOTRIN) 200 MG tablet Take 200 mg by mouth every 6 (six) hours as needed.    . latanoprost (XALATAN) 0.005 % ophthalmic solution Place 1 drop into both eyes at bedtime.     . triamterene-hydrochlorothiazide (MAXZIDE-25) 37.5-25 MG tablet Take 1 tablet by mouth daily. 37.5-25     No current facility-administered medications for this visit.     PHYSICAL EXAMINATION: ECOG PERFORMANCE STATUS: 0 - Asymptomatic  Vitals:   04/28/18 0803  BP: (!) 142/56  Pulse: 81  Resp: 17  Temp: 98.7 F (37.1 C)  SpO2: 98%   Filed Weights   04/28/18 0803  Weight: 195 lb (88.5 kg)    GENERAL:alert, no distress and comfortable SKIN: skin color, texture, turgor are normal, no rashes or significant lesions EYES: normal, Conjunctiva are pink and non-injected, sclera clear OROPHARYNX:no exudate, no erythema and lips, buccal mucosa, and tongue normal  NECK: supple, thyroid normal size, non-tender, without nodularity LYMPH:  no palpable lymphadenopathy in the cervical, axillary or inguinal LUNGS: clear to auscultation and percussion with normal breathing effort HEART: regular rate & rhythm and no murmurs and no lower extremity edema ABDOMEN:abdomen soft, non-tender and normal bowel sounds MUSCULOSKELETAL:no cyanosis of digits and no clubbing  NEURO: alert & oriented x 3 with fluent speech, no focal motor/sensory deficits EXTREMITIES: No lower extremity edema BREAST: No palpable masses or nodules in either right or left breasts. No palpable axillary supraclavicular or infraclavicular adenopathy no breast tenderness or nipple discharge. (exam performed in the presence of a chaperone)  LABORATORY DATA:  I have reviewed the data as  Anastrozole toxicities: Intermittent myalgias especially in the morning but she is able to manage them fairly well.  Breast cancer surveillance: 1.  Breast exam 01/26/2018: Benign 2. Mammogram  will be done on 06/18/2018.  Return to clinic in 1 year for  follow-up   No orders of the defined types were placed in this encounter.  The patient has a good understanding of the overall plan. she agrees with it. she will call with any problems that may develop before the next visit here.   [name], MD 04/28/18

## 2018-04-28 NOTE — Assessment & Plan Note (Signed)
Right lumpectomy: Grade 2 IDC with DCIS, margins negative, invasive tumor 0.8 cm the remainder was DCIS, 0/7 lymph nodes negative, ER 100%, PR 95%, Ki-67 10%, HER-2 negative ratio 1.43, T1b N0 stage I a Oncotype DX score 2:  4% rate of distant recurrence Adjuvant radiation therapy 09/08/2017-10/06/2017  Treatment plan: Adjuvant antiestrogen therapy with anastrozole 1 mg daily times 5 years  Anastrozole toxicities:   Breast cancer surveillance: 1.  Breast exam 01/26/2018: Benign 2. Mammogram  will be done on 06/18/2018.  Return to clinic in 1 year for follow-up

## 2018-04-28 NOTE — Telephone Encounter (Signed)
Gave avs and calendar ° °

## 2018-05-26 DIAGNOSIS — H401223 Low-tension glaucoma, left eye, severe stage: Secondary | ICD-10-CM | POA: Diagnosis not present

## 2018-05-26 DIAGNOSIS — H401211 Low-tension glaucoma, right eye, mild stage: Secondary | ICD-10-CM | POA: Diagnosis not present

## 2018-06-04 DIAGNOSIS — Z23 Encounter for immunization: Secondary | ICD-10-CM | POA: Diagnosis not present

## 2018-06-18 ENCOUNTER — Ambulatory Visit
Admission: RE | Admit: 2018-06-18 | Discharge: 2018-06-18 | Disposition: A | Discharge status: Home / Self Care | Payer: Medicare Other | Source: Ambulatory Visit | Attending: Adult Health | Admitting: Adult Health

## 2018-06-18 DIAGNOSIS — Z170 Estrogen receptor positive status [ER+]: Principal | ICD-10-CM

## 2018-06-18 DIAGNOSIS — Z853 Personal history of malignant neoplasm of breast: Secondary | ICD-10-CM | POA: Diagnosis not present

## 2018-06-18 DIAGNOSIS — R928 Other abnormal and inconclusive findings on diagnostic imaging of breast: Secondary | ICD-10-CM | POA: Diagnosis not present

## 2018-06-18 DIAGNOSIS — C50411 Malignant neoplasm of upper-outer quadrant of right female breast: Secondary | ICD-10-CM

## 2018-06-19 DIAGNOSIS — D0511 Intraductal carcinoma in situ of right breast: Secondary | ICD-10-CM | POA: Diagnosis not present

## 2018-06-19 DIAGNOSIS — C50911 Malignant neoplasm of unspecified site of right female breast: Secondary | ICD-10-CM | POA: Diagnosis not present

## 2018-07-03 DIAGNOSIS — C50911 Malignant neoplasm of unspecified site of right female breast: Secondary | ICD-10-CM | POA: Diagnosis not present

## 2018-07-03 DIAGNOSIS — E782 Mixed hyperlipidemia: Secondary | ICD-10-CM | POA: Diagnosis not present

## 2018-07-03 DIAGNOSIS — I10 Essential (primary) hypertension: Secondary | ICD-10-CM | POA: Diagnosis not present

## 2018-07-03 DIAGNOSIS — R7301 Impaired fasting glucose: Secondary | ICD-10-CM | POA: Diagnosis not present

## 2018-07-03 DIAGNOSIS — J302 Other seasonal allergic rhinitis: Secondary | ICD-10-CM | POA: Diagnosis not present

## 2018-08-22 DIAGNOSIS — J209 Acute bronchitis, unspecified: Secondary | ICD-10-CM | POA: Diagnosis not present

## 2018-08-24 DIAGNOSIS — J988 Other specified respiratory disorders: Secondary | ICD-10-CM | POA: Diagnosis not present

## 2018-09-30 IMAGING — US US ABDOMINAL AORTA SCREENING AAA
1 series · 11 of 11 positions shown · non-contrast
Comparison: None.

CLINICAL DATA: Screening for abdominal aortic aneurysm

EXAM:
ULTRASOUND OF ABDOMINAL AORTA
TECHNIQUE: Ultrasound examination of the abdominal aorta was performed to
evaluate for abdominal aortic aneurysm.

[Series 1: us abdominal aorta screening aaa · 0.26mm/px · 11 of 11 slices shown]
[im 1/11]
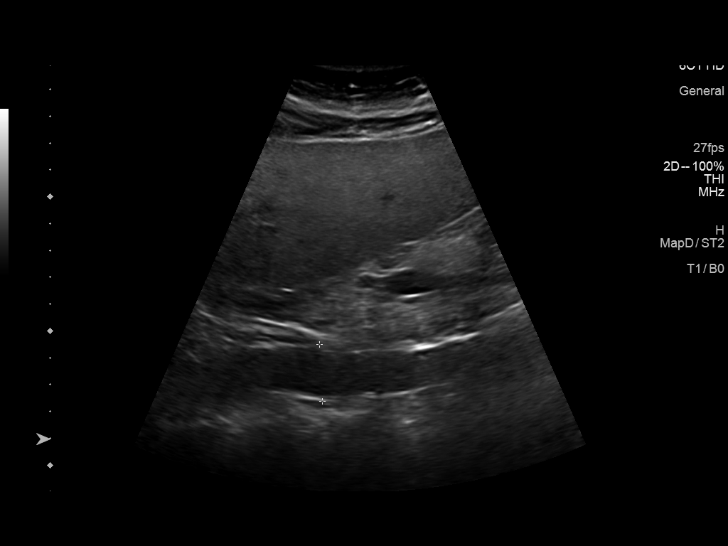
[im 2/11]
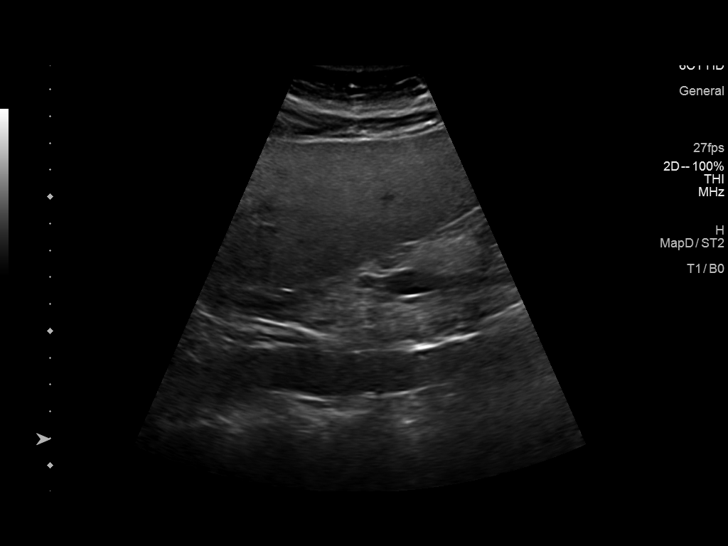
[im 3/11]
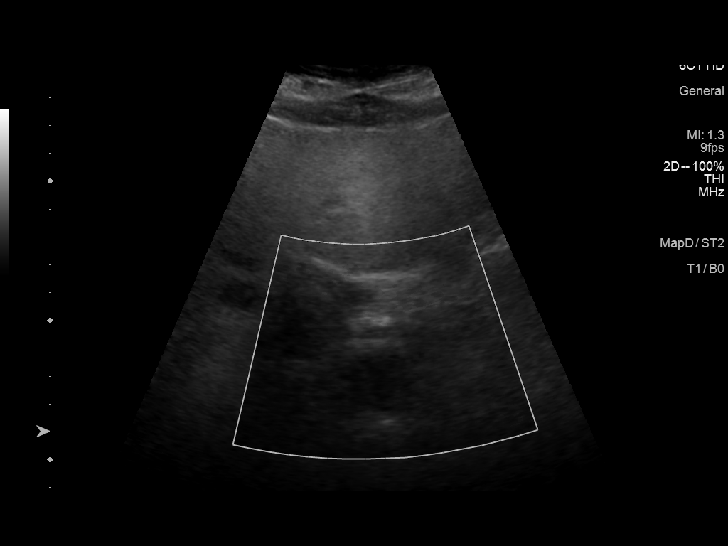
[im 4/11]
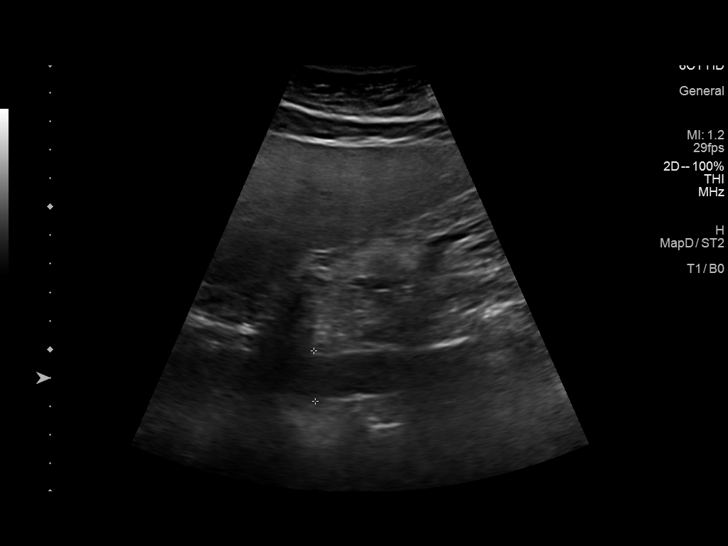
[im 5/11]
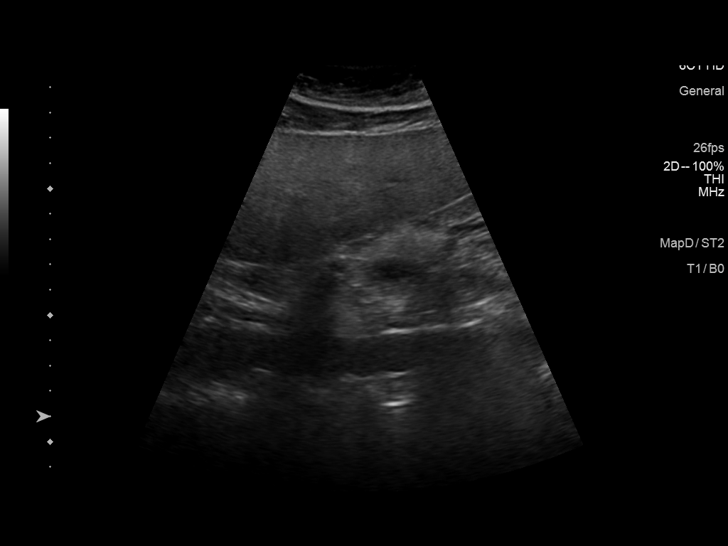
[im 6/11]
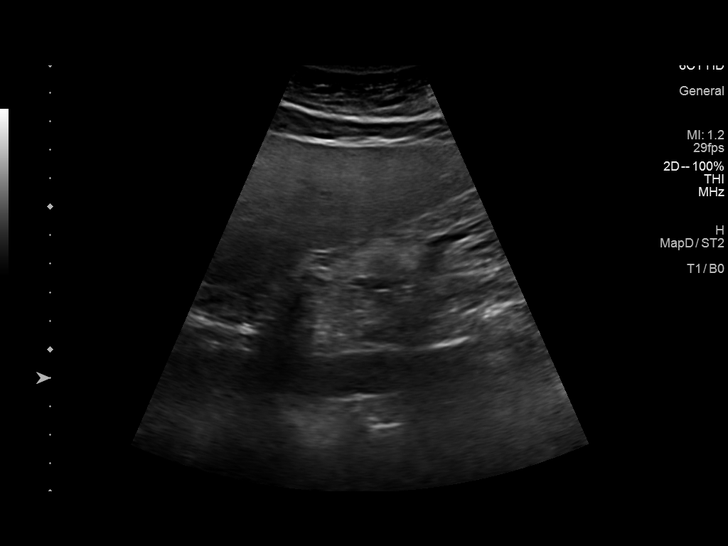
[im 7/11]
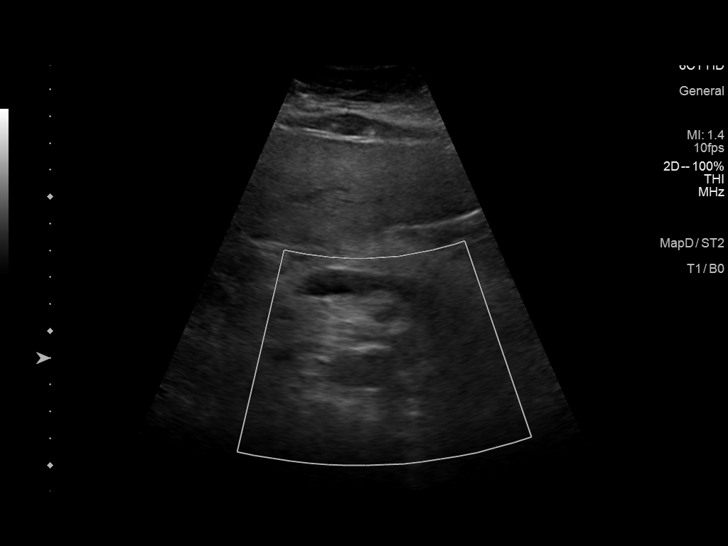
[im 8/11]
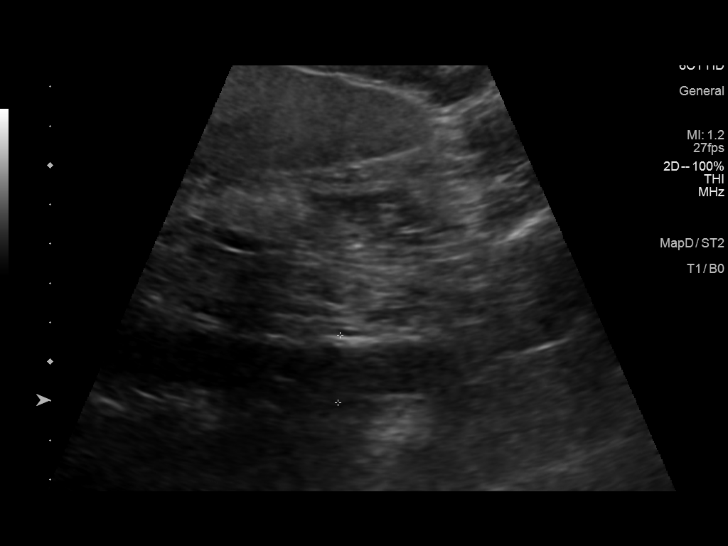
[im 9/11]
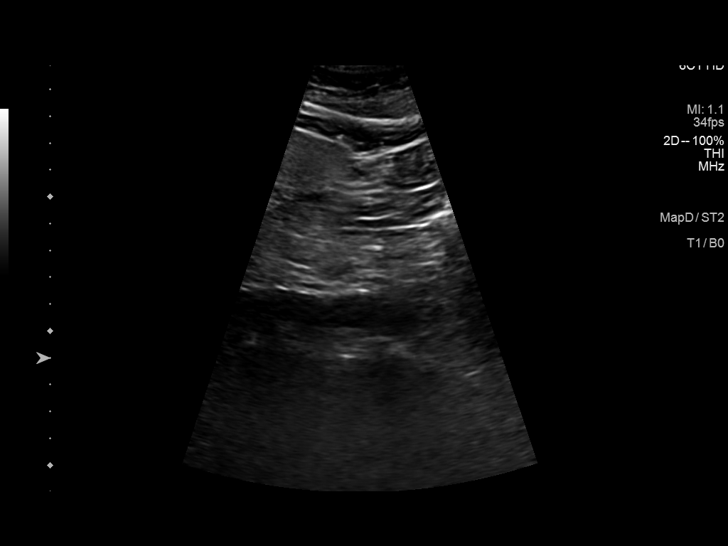
[im 10/11]
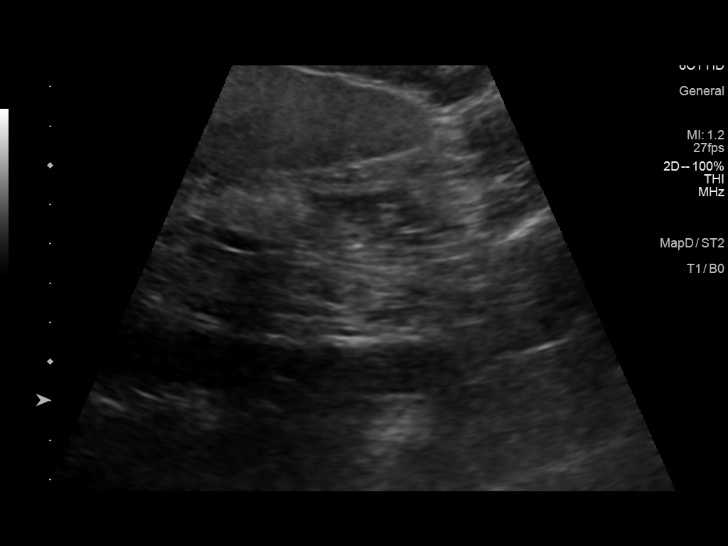
[im 11/11]
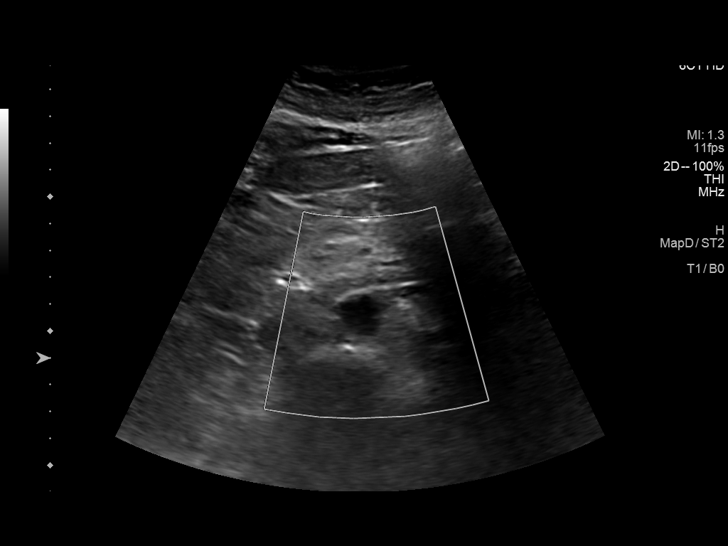

[11 of 11 positions shown; findings below may reference images not displayed]

FINDINGS: Abdominal Aorta

No aneurysm identified.

Proximal aorta measures 2.1 cm in diameter.

Mid aorta measures 1.8 cm in diameter.

Distal aorta measures 1.7 cm in diameter
IMPRESSION: No evidence of abdominal aortic aneurysm. Proximal aorta measures
2.1 cm maximum diameter.

## 2018-10-20 DIAGNOSIS — H401223 Low-tension glaucoma, left eye, severe stage: Secondary | ICD-10-CM | POA: Diagnosis not present

## 2018-10-20 DIAGNOSIS — H401211 Low-tension glaucoma, right eye, mild stage: Secondary | ICD-10-CM | POA: Diagnosis not present

## 2018-10-27 DIAGNOSIS — I499 Cardiac arrhythmia, unspecified: Secondary | ICD-10-CM | POA: Diagnosis not present

## 2018-10-27 DIAGNOSIS — E669 Obesity, unspecified: Secondary | ICD-10-CM | POA: Diagnosis not present

## 2018-10-27 DIAGNOSIS — Z713 Dietary counseling and surveillance: Secondary | ICD-10-CM | POA: Diagnosis not present

## 2018-10-27 DIAGNOSIS — E785 Hyperlipidemia, unspecified: Secondary | ICD-10-CM | POA: Diagnosis not present

## 2018-10-27 DIAGNOSIS — I10 Essential (primary) hypertension: Secondary | ICD-10-CM | POA: Diagnosis not present

## 2018-10-27 DIAGNOSIS — Z79899 Other long term (current) drug therapy: Secondary | ICD-10-CM | POA: Diagnosis not present

## 2018-10-27 DIAGNOSIS — Z8249 Family history of ischemic heart disease and other diseases of the circulatory system: Secondary | ICD-10-CM | POA: Diagnosis not present

## 2018-10-27 DIAGNOSIS — Z7982 Long term (current) use of aspirin: Secondary | ICD-10-CM | POA: Diagnosis not present

## 2018-10-27 DIAGNOSIS — R7309 Other abnormal glucose: Secondary | ICD-10-CM | POA: Diagnosis not present

## 2018-10-27 DIAGNOSIS — Z87891 Personal history of nicotine dependence: Secondary | ICD-10-CM | POA: Diagnosis not present

## 2018-11-04 DIAGNOSIS — E785 Hyperlipidemia, unspecified: Secondary | ICD-10-CM | POA: Diagnosis not present

## 2018-11-30 DIAGNOSIS — D0511 Intraductal carcinoma in situ of right breast: Secondary | ICD-10-CM | POA: Diagnosis not present

## 2018-11-30 DIAGNOSIS — C50911 Malignant neoplasm of unspecified site of right female breast: Secondary | ICD-10-CM | POA: Diagnosis not present

## 2019-01-05 ENCOUNTER — Telehealth: Payer: Self-pay | Admitting: *Deleted

## 2019-01-05 ENCOUNTER — Telehealth: Payer: Self-pay | Admitting: Hematology and Oncology

## 2019-01-05 NOTE — Telephone Encounter (Signed)
Spoke with patient's husband re Webex visit with Dr. Lindi Adie. Sent the email to her email and he had the app downloaded. Told him to call back if they had any questions. He expressed understanding.

## 2019-01-05 NOTE — Progress Notes (Signed)
HEMATOLOGY-ONCOLOGY WEBEX VISIT PROGRESS NOTE  I connected with Cheyenne Lee on 01/06/2019 at  1:15 PM EDT by Webex video conference and verified that I am speaking with the correct person using two identifiers.  I discussed the limitations, risks, security and privacy concerns of performing an evaluation and management service by Webex and the availability of in person appointments.  I also discussed with the patient that there may be a patient responsible charge related to this service. The patient expressed understanding and agreed to proceed.  Patient's Location: Home Physician Location: Clinic  CHIEF COMPLIANT: Follow-up of anastrozole therapy   INTERVAL HISTORY: Cheyenne Lee is a 77 y.o. female with above-mentioned history of right breast cancer treated with lumpectomy and radiation who is currently on anastrozole therapy. I last saw her a year ago. Her most recent mammogram on 06/19/19 showed no evidence of malignancy bilaterally. She presents today over Webex because she felt a cutaneous lesion in the left axilla.  She was worried that this could be a bug bite or lymph node.  She is worried because her sister has a diagnosis of lymphoma.    Malignant neoplasm of upper-outer quadrant of right breast in female, estrogen receptor positive (HCC)   07/15/2017 Surgery    Right lumpectomy: Grade 2 IDC with DCIS, margins negative, invasive tumor 0.8 cm the remainder was DCIS, 0/7 lymph nodes negative, ER 100%, PR 95%, Ki-67 10%, HER-2 negative ratio 1.43, T1b N0 stage I a    07/22/2017 Oncotype testing    Oncotype DX score 2 (risk of distant recurrence 4%)    09/08/2017 - 10/06/2017 Radiation Therapy    Adjuvant radiation    09/30/2017 Genetic Testing    Negative genetic testing on the Multi-cancer panel.  The Multi-Gene Panel offered by [facility] includes sequencing and/or deletion duplication testing of the following 83 genes: ALK, APC, ATM, AXIN2,BAP1,  BARD1, BLM, BMPR1A, BRCA1, BRCA2,  BRIP1, CASR, CDC73, CDH1, CDK4, CDKN1B, CDKN1C, CDKN2A (p14ARF), CDKN2A (p16INK4a), CEBPA, CHEK2, CTNNA1, DICER1, DIS3L2, EGFR (c.2369C>T, p.Thr790Met variant only), EPCAM (Deletion/duplication testing only), FH, FLCN, GATA2, GPC3, GREM1 (Promoter region deletion/duplication testing only), HOXB13 (c.251G>A, p.Gly84Glu), HRAS, KIT, MAX, MEN1, MET, MITF (c.952G>A, p.Glu318Lys variant only), MLH1, MSH2, MSH3, MSH6, MUTYH, NBN, NF1, NF2, NTHL1, PALB2, PDGFRA, PHOX2B, PMS2, POLD1, POLE, POT1, PRKAR1A, PTCH1, PTEN, RAD50, RAD51C, RAD51D, RB1, RECQL4, RET, RUNX1, SDHAF2, SDHA (sequence changes only), SDHB, SDHC, SDHD, SMAD4, SMARCA4, SMARCB1, SMARCE1, STK11, SUFU, TERT, TERT, TMEM127, TP53, TSC1, TSC2, VHL, WRN and WT1.  The report date is September 30, 2017.      11/06/2017 -  Anti-estrogen oral therapy    Anastrozole 1 mg daily      REVIEW OF SYSTEMS:   Constitutional: Denies fevers, chills or abnormal weight loss Eyes: Denies blurriness of vision Ears, nose, mouth, throat, and face: Denies mucositis or sore throat Respiratory: Denies cough, dyspnea or wheezes Cardiovascular: Denies palpitation, chest discomfort Gastrointestinal:  Denies nausea, heartburn or change in bowel habits Skin: Denies abnormal skin rashes Lymphatics: Denies new lymphadenopathy or easy bruising Neurological:Denies numbness, tingling or new weaknesses Behavioral/Psych: Mood is stable, no new changes  Extremities: No lower extremity edema Breast: In the left axilla there is a cutaneous lesion that appears to be a hair follicle infection. All other systems were reviewed with the patient and are negative.  Observations/Objective:  There were no vitals filed for this visit. There is no height or weight on file to calculate BMI.  I have reviewed the data as listed CMP Latest Ref Rng &   Units 07/10/2017  Glucose 65 - 99 mg/dL 142(H)  BUN 6 - 20 mg/dL 15  Creatinine 0.44 - 1.00 mg/dL 0.93  Sodium 135 - 145 mmol/L 137   Potassium 3.5 - 5.1 mmol/L 4.0  Chloride 101 - 111 mmol/L 102  CO2 22 - 32 mmol/L 24  Calcium 8.9 - 10.3 mg/dL 9.1    No results found for: WBC, HGB, HCT, MCV, PLT, NEUTROABS    Assessment Plan:  Malignant neoplasm of upper-outer quadrant of right breast in female, estrogen receptor positive (HCC) Anastrozole toxicities: Intermittent myalgias especially in the morning but she is able to manage them fairly well. Rt arm pain: Improved.  Left axilla folliculitis: It appears to be very mild and it does not have any widespread erythematous change.  I recommended topical bacitracin ointment and if it does not get better in a week to call us back for antibiotics.  Breast cancer surveillance: 1.  Breast exam 01/26/2018: Benign 2. Mammogram  06/18/2018. 3. Bone density March 2019: T score -0.9  Patient would like to keep her appointment in August for follow-up to see me.   I discussed the assessment and treatment plan with the patient. The patient was provided an opportunity to ask questions and all were answered. The patient agreed with the plan and demonstrated an understanding of the instructions. The patient was advised to call back or seek an in-person evaluation if the symptoms worsen or if the condition fails to improve as anticipated.   I provided 15 minutes of face-to-face Web Ex time during this encounter.    Rulon Eisenmenger, MD 01/06/2019    I, Molly Dorshimer, am acting as scribe for Nicholas Lose, MD.  I have reviewed the above documentation for accuracy and completeness, and I agree with the above.

## 2019-01-05 NOTE — Telephone Encounter (Signed)
Received call from pt stating she has a new lump near her left axillary and is very concerned and wanting an apt with Dr. Lindi Adie.  Pt states she is able to do a [place] apt, high priority message sent to scheduling to schedule pt apt for tomorrow 01/06/2019 at 1:15pm.  Pt educated on how to download webex app on her phone and pt verbalizes understanding.

## 2019-01-06 ENCOUNTER — Inpatient Hospital Stay: Payer: Medicare Other | Attending: Hematology and Oncology | Admitting: Hematology and Oncology

## 2019-01-06 DIAGNOSIS — C50411 Malignant neoplasm of upper-outer quadrant of right female breast: Secondary | ICD-10-CM

## 2019-01-06 DIAGNOSIS — Z170 Estrogen receptor positive status [ER+]: Secondary | ICD-10-CM

## 2019-01-06 MED ORDER — ANASTROZOLE 1 MG PO TABS: 1.0000 mg | ORAL_TABLET | Freq: Every day | ORAL | 3 refills | Status: DC

## 2019-01-06 NOTE — Assessment & Plan Note (Signed)
Anastrozole toxicities: Intermittent myalgias especially in the morning but she is able to manage them fairly well.  Breast cancer surveillance: 1.  Breast exam 01/26/2018: Benign 2. Mammogram  06/18/2018.  Return to clinic in 1 year for follow-up

## 2019-03-24 ENCOUNTER — Telehealth: Payer: Self-pay | Admitting: Hematology and Oncology

## 2019-03-24 NOTE — Telephone Encounter (Signed)
Returned patient's phone call regarding rescheduling 08/06 appointment. It has been moved to 08/10.

## 2019-04-26 NOTE — Assessment & Plan Note (Signed)
Intermittent myalgias especially in the morning but she is able to manage them fairly well. Rt arm pain: Improved.  Left axilla folliculitis: It appears to be very mild and it does not have any widespread erythematous change. I recommended topical bacitracin ointment and if it does not get better in a week to call us back for antibiotics.  Breast cancer surveillance: 1.Breast exam 01/26/2018: Benign 2.Mammogram 06/18/2018. 3. Bone density March 2019: T score -0.9  RTC in 1 year

## 2019-04-29 ENCOUNTER — Ambulatory Visit: Payer: Medicare Other | Admitting: Hematology and Oncology

## 2019-05-01 IMAGING — MG MM PLC BREAST LOC DEV 1ST LESION INC*R*
8 series · 8 of 8 positions shown · non-contrast
Comparison: Previous exam(s).

CLINICAL DATA: Biopsy-proven DCIS involving the upper outer
quadrant of the right breast at anterior to middle depth.
Radioactive seed localization in anticipation of lumpectomy which is
scheduled for tomorrow.

EXAM:
MAMMOGRAPHIC GUIDED RADIOACTIVE SEED LOCALIZATION OF THE RIGHT
BREAST

[R ML]
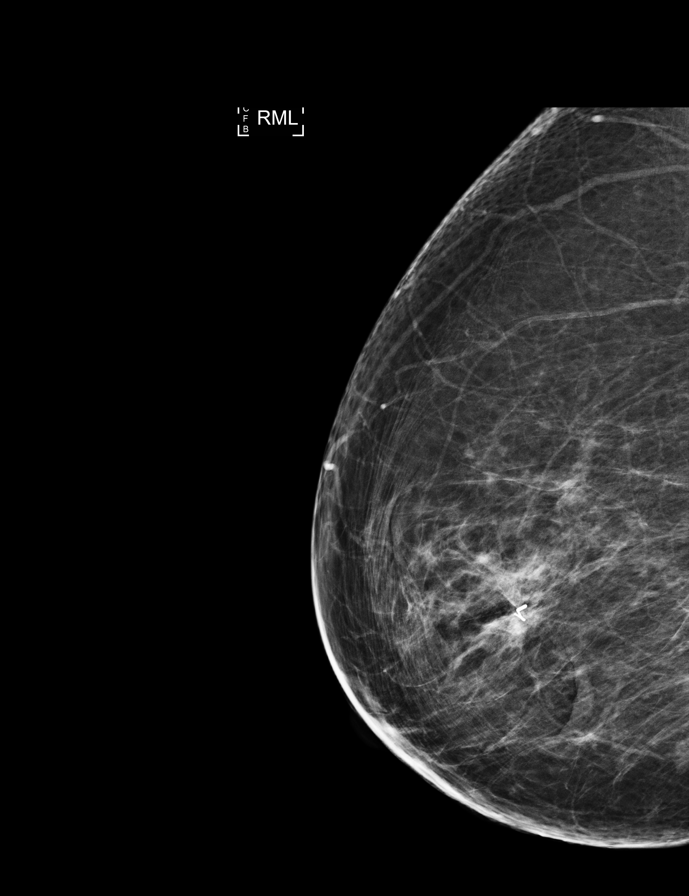

[R CC (1 of 5)]
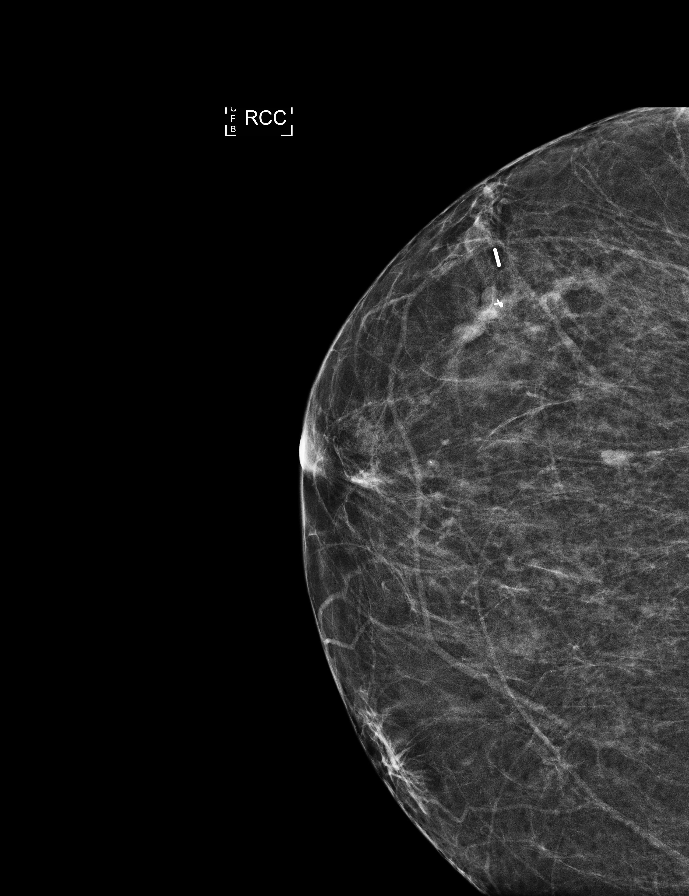

[R LM (1 of 2)]
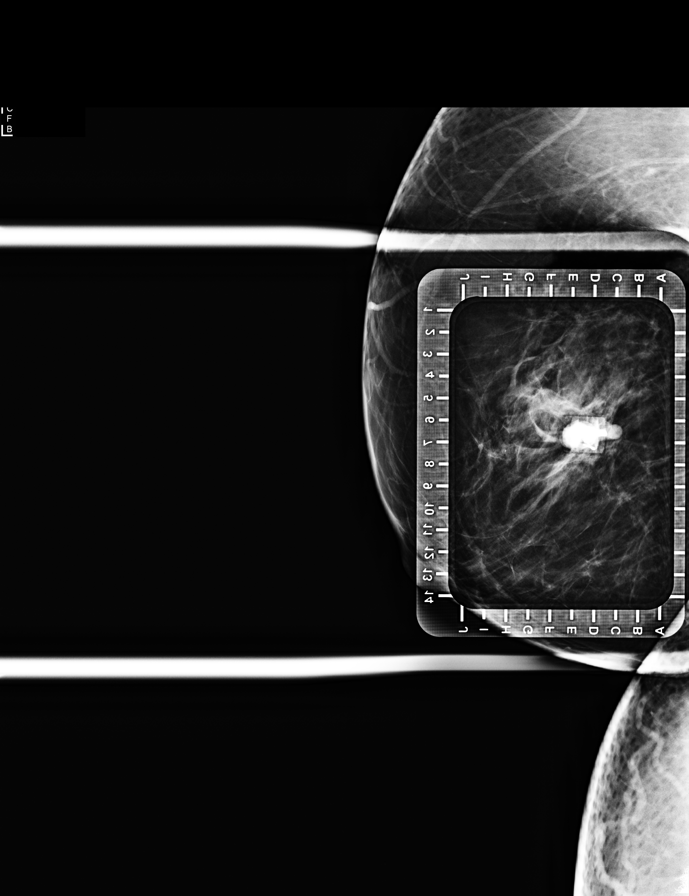

[R CC (2 of 5)]
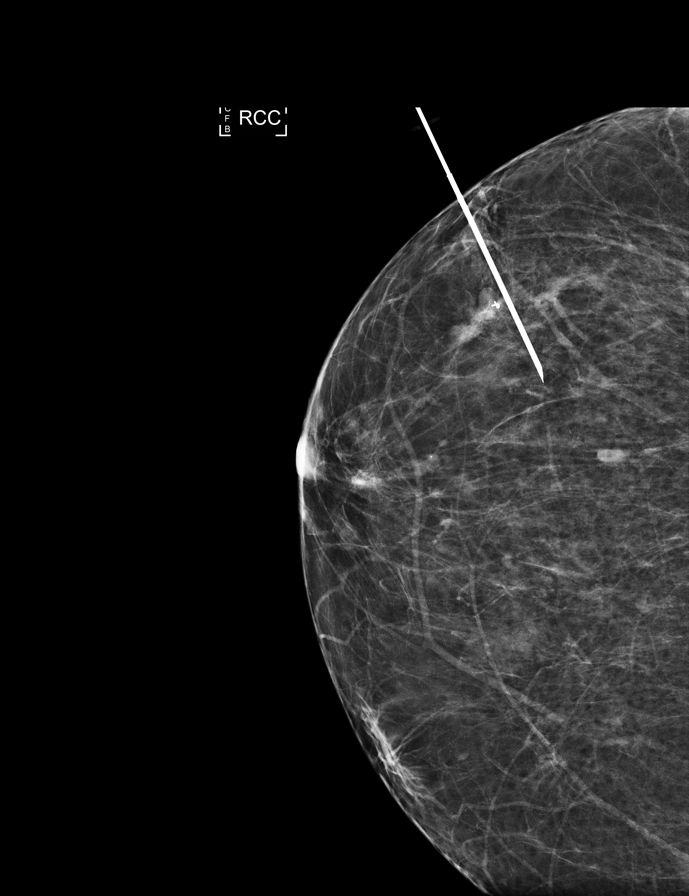

[R CC (3 of 5)]
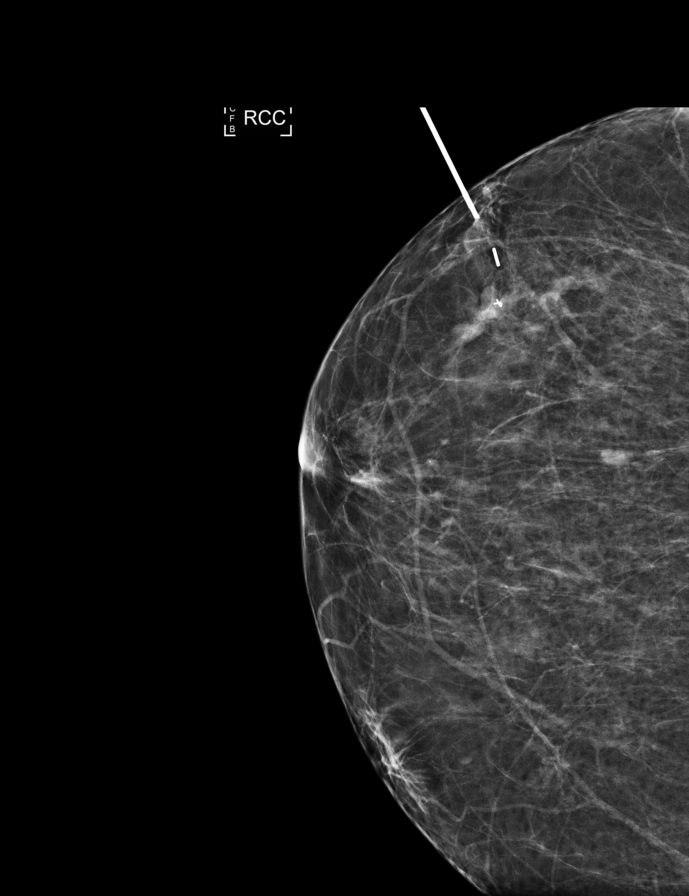

[R LM (2 of 2)]
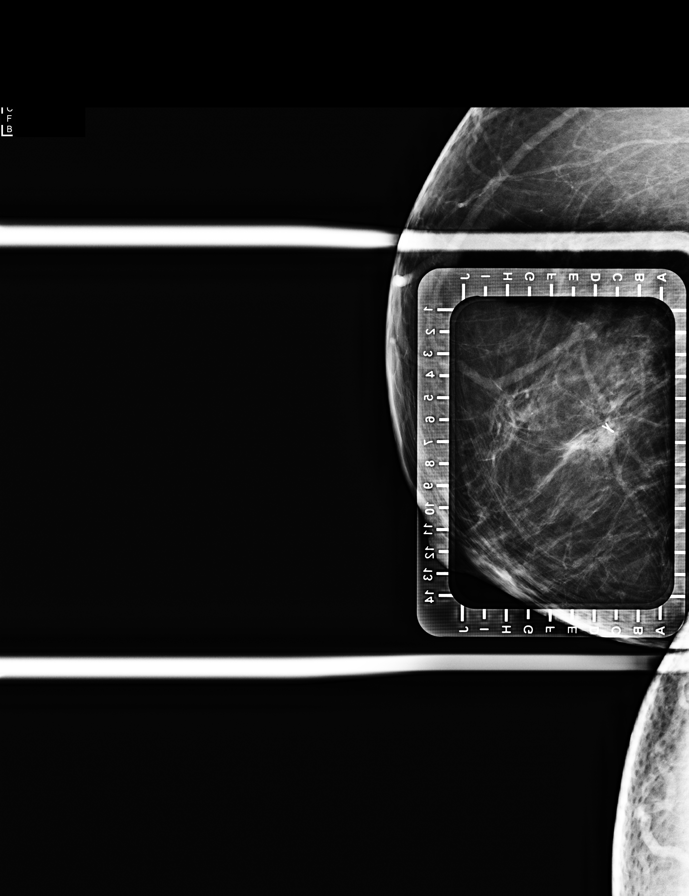

[R CC (4 of 5)]
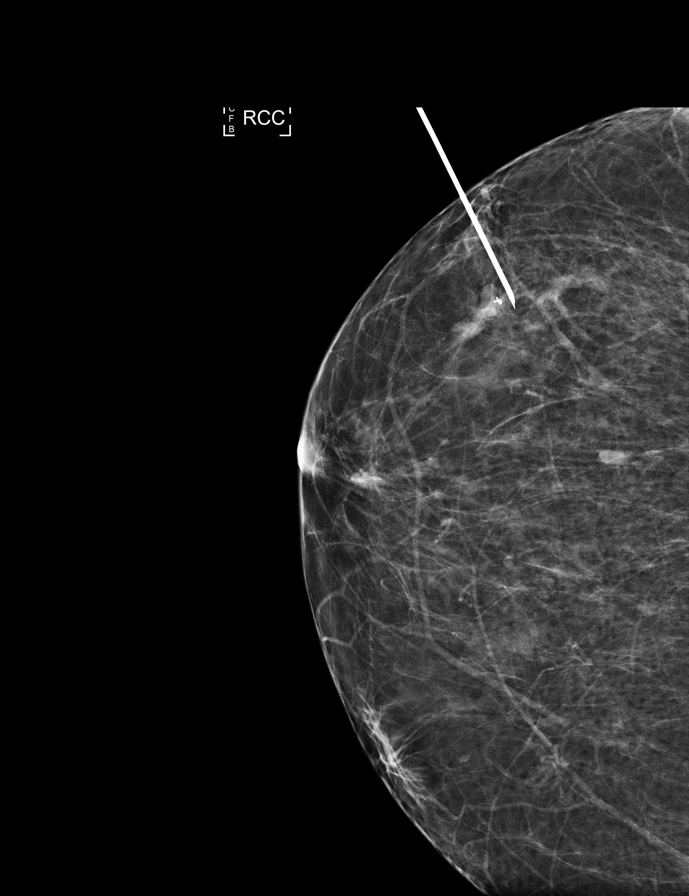

[R CC (5 of 5)]
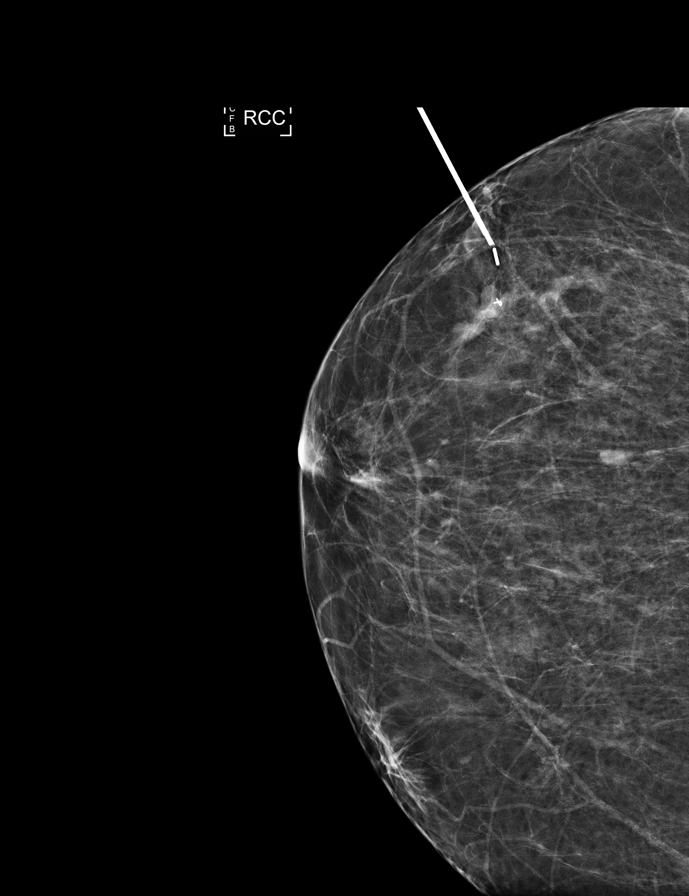

[8 of 8 positions shown; findings below may reference images not displayed]

FINDINGS: Patient presents for radioactive seed localization prior to right
breast lumpectomy. I met with the patient and we discussed the
procedure of seed localization including benefits and alternatives.
We discussed the high likelihood of a successful procedure. We
discussed the risks of the procedure including infection, bleeding,
tissue injury and further surgery. We discussed the low dose of
radioactivity involved in the procedure. Informed, written consent
was given.

The usual time-out protocol was performed immediately prior to the
procedure.

Using mammographic guidance, sterile technique with chlorhexidine as
skin antisepsis, 1% lidocaine as local anesthetic, an O-PPM
radioactive seed was used to localize the ribbon shaped tissue
marker clip in the upper outer right breast using a lateral
approach. The follow-up mammogram images confirm the seed in the
expected location and were marked for Dr. Alomar.

Follow-up survey of the patient confirms the presence of the
radioactive seed.

Order number of O-PPM seed: [id number]

Total activity: 0.249 mCi  Reference Date: 07/04/2017

The patient tolerated the procedure well and was released from the
[REDACTED]. She was given instructions regarding seed removal.
IMPRESSION: Radioactive seed localization of biopsy-proven DCIS involving the
upper outer right breast. No apparent complications.

The seed is approximately 7 mm lateral to the ribbon shaped tissue
marker clip. Calcifications extend approximately 1.2 cm anterior to
the ribbon shaped tissue marker clip.

## 2019-05-02 NOTE — Progress Notes (Signed)
Patient Care Team: [name], MD as PCP - General (Family Medicine) Nicholas Lose, MD as Consulting Physician (Hematology and Oncology) Kyung Rudd, MD as Consulting Physician (Radiation Oncology) Donnie Mesa, MD as Consulting Physician (General Surgery) Gardenia Phlegm, NP as Nurse Practitioner (Hematology and Oncology)  DIAGNOSIS:    ICD-10-CM   1. Malignant neoplasm of upper-outer quadrant of right breast in female, estrogen receptor positive ([place])  C50.411    Z17.0     SUMMARY OF ONCOLOGIC HISTORY: Oncology History  Malignant neoplasm of upper-outer quadrant of right breast in female, estrogen receptor positive ([place])  07/15/2017 Surgery   Right lumpectomy: Grade 2 IDC with DCIS, margins negative, invasive tumor 0.8 cm the remainder was DCIS, 0/7 lymph nodes negative, ER 100%, PR 95%, Ki-67 10%, HER-2 negative ratio 1.43, T1b N0 stage I a   07/22/2017 Oncotype testing   Oncotype DX score 2 (risk of distant recurrence 4%)   09/08/2017 - 10/06/2017 Radiation Therapy   Adjuvant radiation   09/30/2017 Genetic Testing   Negative genetic testing on the Multi-cancer panel.  The Multi-Gene Panel offered by [facility] includes sequencing and/or deletion duplication testing of the following 83 genes: ALK, APC, ATM, AXIN2,BAP1,  BARD1, BLM, BMPR1A, BRCA1, BRCA2, BRIP1, CASR, CDC73, CDH1, CDK4, CDKN1B, CDKN1C, CDKN2A (p14ARF), CDKN2A (p16INK4a), CEBPA, CHEK2, CTNNA1, DICER1, DIS3L2, EGFR (c.2369C>T, p.Thr790Met variant only), EPCAM (Deletion/duplication testing only), FH, FLCN, GATA2, GPC3, GREM1 (Promoter region deletion/duplication testing only), HOXB13 (c.251G>A, p.Gly84Glu), HRAS, KIT, MAX, MEN1, MET, MITF (c.952G>A, p.Glu318Lys variant only), MLH1, MSH2, MSH3, MSH6, MUTYH, NBN, NF1, NF2, NTHL1, PALB2, PDGFRA, PHOX2B, PMS2, POLD1, POLE, POT1, PRKAR1A, PTCH1, PTEN, RAD50, RAD51C, RAD51D, RB1, RECQL4, RET, RUNX1, SDHAF2, SDHA (sequence changes only), SDHB, SDHC, SDHD, SMAD4,  SMARCA4, SMARCB1, SMARCE1, STK11, SUFU, TERT, TERT, TMEM127, TP53, TSC1, TSC2, VHL, WRN and WT1.  The report date is September 30, 2017.     11/06/2017 -  Anti-estrogen oral therapy   Anastrozole 1 mg daily      CHIEF COMPLIANT: Follow-up of right right breast cancer on anastrozole therapy   INTERVAL HISTORY: Cheyenne Lee is a 78 y.o. with above-mentioned history of right breast cancer treated with lumpectomy and radiation who is currently on anastrozole therapy. She presents to the clinic today for follow-up.  Her major complaint today is diffuse arthralgias myalgias.  This is been going on for quite some time but she has not stopped the anastrozole.  REVIEW OF SYSTEMS:   Constitutional: Denies fevers, chills or abnormal weight loss Eyes: Denies blurriness of vision Ears, nose, mouth, throat, and face: Denies mucositis or sore throat Respiratory: Denies cough, dyspnea or wheezes Cardiovascular: Denies palpitation, chest discomfort Gastrointestinal: Denies nausea, heartburn or change in bowel habits Skin: Denies abnormal skin rashes Lymphatics: Denies new lymphadenopathy or easy bruising Neurological: Denies numbness, tingling or new weaknesses Behavioral/Psych: Mood is stable, no new changes  Extremities: No lower extremity edema Breast: denies any pain or lumps or nodules in either breasts All other systems were reviewed with the patient and are negative.  I have reviewed the past medical history, past surgical history, social history and family history with the patient and they are unchanged from previous note.  ALLERGIES:  is allergic to codeine and tape.  MEDICATIONS:  Current Outpatient Medications  Medication Sig Dispense Refill  . anastrozole (ARIMIDEX) 1 MG tablet Take 1 tablet (1 mg total) by mouth daily. 90 tablet 3  . dicyclomine (BENTYL) 10 MG capsule Take 10 mg by mouth as needed.     Marland Kitchen  fexofenadine (ALLEGRA) 180 MG tablet Take 180 mg by mouth as needed.     Marland Kitchen  ibuprofen (ADVIL,MOTRIN) 200 MG tablet Take 200 mg by mouth every 6 (six) hours as needed.    . latanoprost (XALATAN) 0.005 % ophthalmic solution Place 1 drop into both eyes at bedtime.     . triamterene-hydrochlorothiazide (MAXZIDE-25) 37.5-25 MG tablet Take 1 tablet by mouth daily. 37.5-25     No current facility-administered medications for this visit.     PHYSICAL EXAMINATION: ECOG PERFORMANCE STATUS: 1 - Symptomatic but completely ambulatory  There were no vitals filed for this visit. There were no vitals filed for this visit.  GENERAL: alert, no distress and comfortable SKIN: skin color, texture, turgor are normal, no rashes or significant lesions EYES: normal, Conjunctiva are pink and non-injected, sclera clear OROPHARYNX: no exudate, no erythema and lips, buccal mucosa, and tongue normal  NECK: supple, thyroid normal size, non-tender, without nodularity LYMPH: no palpable lymphadenopathy in the cervical, axillary or inguinal LUNGS: clear to auscultation and percussion with normal breathing effort HEART: regular rate & rhythm and no murmurs and no lower extremity edema ABDOMEN: abdomen soft, non-tender and normal bowel sounds MUSCULOSKELETAL: no cyanosis of digits and no clubbing  NEURO: alert & oriented x 3 with fluent speech, no focal motor/sensory deficits EXTREMITIES: No lower extremity edema  LABORATORY DATA:  I have reviewed the data as listed CMP Latest Ref Rng & Units 07/10/2017  Glucose 65 - 99 mg/dL 142(H)  BUN 6 - 20 mg/dL 15  Creatinine 0.44 - 1.00 mg/dL 0.93  Sodium 135 - 145 mmol/L 137  Potassium 3.5 - 5.1 mmol/L 4.0  Chloride 101 - 111 mmol/L 102  CO2 22 - 32 mmol/L 24  Calcium 8.9 - 10.3 mg/dL 9.1    No results found for: WBC, HGB, HCT, MCV, PLT, NEUTROABS  ASSESSMENT & PLAN:  Malignant neoplasm of upper-outer quadrant of right breast in female, estrogen receptor positive (HCC) Intermittent myalgias especially in the morning but she is able to manage  them fairly well. Rt arm pain: Improved.  Anastrozole toxicities: Severe myalgias and arthralgias in her legs arms and her back. I instructed her to switch to letrozole after taking 2 weeks break. Patient agrees to do that.  If she cannot tolerate even letrozole then she will take half a tablet letrozole. She will call us in a month to ask for a refill that can be sent to [facility] for a 90-day supply.  Breast cancer surveillance: 1.Breast exam 05/03/2019: Benign 2.Mammogram 06/18/2018. 3. Bone density March 2019: T score -0.9  RTC in 1 year  No orders of the defined types were placed in this encounter.  The patient has a good understanding of the overall plan. she agrees with it. she will call with any problems that may develop before the next visit here.  Nicholas Lose, MD 05/03/2019  Julious Oka Dorshimer am acting as scribe for Dr. Nicholas Lose.  I have reviewed the above documentation for accuracy and completeness, and I agree with the above.

## 2019-05-03 ENCOUNTER — Other Ambulatory Visit: Payer: Self-pay

## 2019-05-03 ENCOUNTER — Telehealth: Payer: Self-pay | Admitting: Hematology and Oncology

## 2019-05-03 ENCOUNTER — Inpatient Hospital Stay: Payer: Medicare Other | Attending: Hematology and Oncology | Admitting: Hematology and Oncology

## 2019-05-03 DIAGNOSIS — Z79899 Other long term (current) drug therapy: Secondary | ICD-10-CM | POA: Insufficient documentation

## 2019-05-03 DIAGNOSIS — Z79811 Long term (current) use of aromatase inhibitors: Secondary | ICD-10-CM | POA: Insufficient documentation

## 2019-05-03 DIAGNOSIS — C50411 Malignant neoplasm of upper-outer quadrant of right female breast: Secondary | ICD-10-CM

## 2019-05-03 DIAGNOSIS — M2550 Pain in unspecified joint: Secondary | ICD-10-CM | POA: Insufficient documentation

## 2019-05-03 DIAGNOSIS — Z923 Personal history of irradiation: Secondary | ICD-10-CM | POA: Diagnosis not present

## 2019-05-03 DIAGNOSIS — M79601 Pain in right arm: Secondary | ICD-10-CM | POA: Diagnosis not present

## 2019-05-03 DIAGNOSIS — M791 Myalgia, unspecified site: Secondary | ICD-10-CM | POA: Insufficient documentation

## 2019-05-03 DIAGNOSIS — Z170 Estrogen receptor positive status [ER+]: Secondary | ICD-10-CM | POA: Insufficient documentation

## 2019-05-03 MED ORDER — LETROZOLE 2.5 MG PO TABS: 2.5000 mg | ORAL_TABLET | Freq: Every day | ORAL | 0 refills | Status: DC

## 2019-05-03 NOTE — Telephone Encounter (Signed)
I talk with patient regarding schedule  

## 2019-05-10 ENCOUNTER — Other Ambulatory Visit: Payer: Self-pay | Admitting: Hematology and Oncology

## 2019-05-10 ENCOUNTER — Telehealth: Payer: Self-pay

## 2019-05-10 ENCOUNTER — Other Ambulatory Visit: Payer: Self-pay

## 2019-05-10 DIAGNOSIS — Z9889 Other specified postprocedural states: Secondary | ICD-10-CM

## 2019-05-10 DIAGNOSIS — C50411 Malignant neoplasm of upper-outer quadrant of right female breast: Secondary | ICD-10-CM

## 2019-05-10 DIAGNOSIS — M79601 Pain in right arm: Secondary | ICD-10-CM

## 2019-05-10 NOTE — Telephone Encounter (Signed)
RN returned call. Pt requesting referral for physical therapy for right arm stiffness, and pain.   Pt with history of right breast lumpectomy.  Pt reports pain has decreased over time, but would like to strengthen right arm.    RN placed referral.  Per patient request referral faxed to [facility].  [phone].

## 2019-05-13 DIAGNOSIS — H524 Presbyopia: Secondary | ICD-10-CM | POA: Diagnosis not present

## 2019-05-13 DIAGNOSIS — H401211 Low-tension glaucoma, right eye, mild stage: Secondary | ICD-10-CM | POA: Diagnosis not present

## 2019-05-13 DIAGNOSIS — H02834 Dermatochalasis of left upper eyelid: Secondary | ICD-10-CM | POA: Diagnosis not present

## 2019-05-13 DIAGNOSIS — H52203 Unspecified astigmatism, bilateral: Secondary | ICD-10-CM | POA: Diagnosis not present

## 2019-05-13 DIAGNOSIS — H43813 Vitreous degeneration, bilateral: Secondary | ICD-10-CM | POA: Diagnosis not present

## 2019-05-13 DIAGNOSIS — H5213 Myopia, bilateral: Secondary | ICD-10-CM | POA: Diagnosis not present

## 2019-05-13 DIAGNOSIS — H02831 Dermatochalasis of right upper eyelid: Secondary | ICD-10-CM | POA: Diagnosis not present

## 2019-05-13 DIAGNOSIS — Z961 Presence of intraocular lens: Secondary | ICD-10-CM | POA: Diagnosis not present

## 2019-05-13 DIAGNOSIS — H401223 Low-tension glaucoma, left eye, severe stage: Secondary | ICD-10-CM | POA: Diagnosis not present

## 2019-05-13 DIAGNOSIS — H16223 Keratoconjunctivitis sicca, not specified as Sjogren's, bilateral: Secondary | ICD-10-CM | POA: Diagnosis not present

## 2019-05-14 DIAGNOSIS — M79601 Pain in right arm: Secondary | ICD-10-CM | POA: Diagnosis not present

## 2019-05-14 DIAGNOSIS — M7501 Adhesive capsulitis of right shoulder: Secondary | ICD-10-CM | POA: Diagnosis not present

## 2019-05-17 DIAGNOSIS — M79601 Pain in right arm: Secondary | ICD-10-CM | POA: Diagnosis not present

## 2019-05-17 DIAGNOSIS — M7501 Adhesive capsulitis of right shoulder: Secondary | ICD-10-CM | POA: Diagnosis not present

## 2019-05-18 DIAGNOSIS — Z23 Encounter for immunization: Secondary | ICD-10-CM | POA: Diagnosis not present

## 2019-05-20 ENCOUNTER — Telehealth: Payer: Self-pay | Admitting: *Deleted

## 2019-05-20 DIAGNOSIS — M7501 Adhesive capsulitis of right shoulder: Secondary | ICD-10-CM | POA: Diagnosis not present

## 2019-05-20 DIAGNOSIS — M79601 Pain in right arm: Secondary | ICD-10-CM | POA: Diagnosis not present

## 2019-05-20 MED ORDER — LETROZOLE 2.5 MG PO TABS: 2.5000 mg | ORAL_TABLET | Freq: Every day | ORAL | 0 refills | Status: DC

## 2019-05-20 NOTE — Telephone Encounter (Signed)
Received call from pt stating she has been taking 1/2 a tablet of Letrozole 2.5mg  and is not experiencing any negative side effects.  Pt states she will continue to take 1/2 a tablet daily.

## 2019-05-21 ENCOUNTER — Other Ambulatory Visit: Payer: Self-pay | Admitting: *Deleted

## 2019-05-24 ENCOUNTER — Telehealth: Payer: Self-pay

## 2019-05-24 DIAGNOSIS — Z803 Family history of malignant neoplasm of breast: Secondary | ICD-10-CM

## 2019-05-24 NOTE — Telephone Encounter (Signed)
RN spoke with patient regarding stiffness and concerns with lymphedema in right arm.  PT referral was placed earlier to patient's preference for location.  Pt does feel she is getting the improvement she was hoping for.   RN spoke with patient about our lymphedema clinic that specializes.  Pt voiced she would like to try this location.  Referral placed.  No further needs.

## 2019-05-25 DIAGNOSIS — M79601 Pain in right arm: Secondary | ICD-10-CM | POA: Diagnosis not present

## 2019-05-25 DIAGNOSIS — M7501 Adhesive capsulitis of right shoulder: Secondary | ICD-10-CM | POA: Diagnosis not present

## 2019-05-27 ENCOUNTER — Other Ambulatory Visit: Payer: Self-pay

## 2019-05-27 ENCOUNTER — Ambulatory Visit: Payer: Medicare Other | Attending: Hematology and Oncology | Admitting: Rehabilitation

## 2019-05-27 ENCOUNTER — Encounter: Payer: Self-pay | Admitting: Rehabilitation

## 2019-05-27 DIAGNOSIS — I890 Lymphedema, not elsewhere classified: Secondary | ICD-10-CM | POA: Diagnosis not present

## 2019-05-27 DIAGNOSIS — R293 Abnormal posture: Secondary | ICD-10-CM | POA: Diagnosis not present

## 2019-05-27 DIAGNOSIS — M79601 Pain in right arm: Secondary | ICD-10-CM | POA: Insufficient documentation

## 2019-05-27 NOTE — Therapy (Signed)
[place], [place], [address] Phone: [phone]   Fax:  [phone]  Physical Therapy Evaluation  Patient Details  Name: Cheyenne Lee MRN: [id number] Date of Birth: 21-Apr-1941 Referring Provider (PT): Dr. Lindi Adie   Encounter Date: 05/27/2019  PT End of Session - 05/27/19 1423    Visit Number  1    Number of Visits  9    Date for PT Re-Evaluation  07/08/19    Authorization Type  medicare/BCBS    PT Start Time  1430    PT Stop Time  1515    PT Time Calculation (min)  45 min    Activity Tolerance  Patient tolerated treatment well    Behavior During Therapy  [facility] for tasks assessed/performed       Past Medical History:  Diagnosis Date  . Arthritis    hips  . Breast cancer ([place]) 06/25/2017   Right breast  . Cancer ([place])    right breast DCIS  . Complication of anesthesia   . Family history of breast cancer   . Family history of colon cancer   . Family history of malignant neoplasm of breast   . Glaucoma   . H/O seasonal allergies   . Hypertension   . IBS (irritable bowel syndrome)   . PONV (postoperative nausea and vomiting)     Past Surgical History:  Procedure Laterality Date  . AXILLARY LYMPH NODE DISSECTION Right 07/29/2017   Procedure: AXILLARY LYMPH NODE DISSECTION;  Surgeon: Donnie Mesa, MD;  Location: [place];  Service: General;  Laterality: Right;  . AXILLARY SENTINEL NODE BIOPSY Right 07/29/2017   Procedure: RIGHT AXILLARY SENTINEL NODE BIOPSY;  Surgeon: Donnie Mesa, MD;  Location: [place];  Service: General;  Laterality: Right;  . BREAST BIOPSY     Multiple Biopsies, both breasts  . BREAST LUMPECTOMY WITH RADIOACTIVE SEED LOCALIZATION Right 07/15/2017   Procedure: RIGHT BREAST LUMPECTOMY WITH RADIOACTIVE SEED LOCALIZATION ERAS PATHWAY;  Surgeon: Donnie Mesa, MD;  Location: [place];  Service: General;  Laterality: Right;   LMA  . CESAREAN SECTION     x4    There were no vitals filed for this visit.   Subjective Assessment - 05/27/19 1334    Subjective  it is just painful.  The whole Rt arm.  it is also swollen.  Maybe 2-3 months.  It started swelling kind of slow. I tried other therapy and it did not help me.    Pertinent History  Rt lumpectomy due to grade 2 IDC with DCIS on 07/15/17 ER/PR positive, HER2 negative with 0/7 lymph nodes negative. Radiation completed.  Currently on letrozole. Other history includes: obesity, HTN,    Limitations  Other (comment)   do hair, reach up high, reach behind the back   Patient Stated Goals  decrease the Rt arm pain    Currently in Pain?  Yes    Pain Score  1     Pain Location  Arm    Pain Orientation  Right    Pain Descriptors / Indicators  Aching;Sore    Pain Type  Chronic pain;Surgical pain    Pain Onset  More than a month ago    Pain Frequency  Constant    Aggravating Factors   10/10 with reaching    Pain Relieving Factors  massage, rest    Effect of Pain on Daily Activities  I can't hold much over there or lift things  [facility] PT Assessment - 05/27/19 0001      Assessment   Medical Diagnosis  Rt lymphedema    Referring Provider (PT)  Dr. Lindi Adie    Onset Date/Surgical Date  11/22/18    Hand Dominance  Right    Prior Therapy  yes not here      Precautions   Precaution Comments  lymphedema, cancer history      Restrictions   Weight Bearing Restrictions  No      Balance Screen   Has the patient fallen in the past 6 months  No      [place] residence    Living Arrangements  Spouse/significant other    Type of [place]      Prior Function   Level of [place]  Retired    Leisure  I have an exercise bike       Cognition   Overall Cognitive Status  Within Functional Limits for tasks assessed      Observation/Other Assessments   Observations  healed lumpectomy and  lymph node surgery pt reports they were separate times, some edema possibly present at the Rt axilla and lateral trunk      Coordination   Gross Motor Movements are Fluid and Coordinated  Yes      Posture/Postural Control   Posture/Postural Control  Postural limitations    Postural Limitations  Rounded Shoulders;Forward head;Increased thoracic kyphosis      ROM / Strength   AROM / PROM / Strength  AROM      AROM   AROM Assessment Site  Shoulder    Right/Left Shoulder  Right;Left        LYMPHEDEMA/ONCOLOGY QUESTIONNAIRE - 05/27/19 1352      Type   Cancer Type  Rt breast      Surgeries   Lumpectomy Date  07/15/17    Sentinel Lymph Node Biopsy Date  07/15/17    Number Lymph Nodes Removed  7      Date Lymphedema/Swelling Started   Date  11/22/18      Treatment   Active Chemotherapy Treatment  No    Past Chemotherapy Treatment  No    Active Radiation Treatment  No    Past Radiation Treatment  Yes    Current Hormone Treatment  Yes      What other symptoms do you have   Are you Having Heaviness or Tightness  Yes    Are you having Pain  Yes    Are you having pitting edema  No      Lymphedema Assessments   Lymphedema Assessments  Upper extremities      Right Upper Extremity Lymphedema   15 cm Proximal to Olecranon Process  36.5 cm    10 cm Proximal to Olecranon Process  35.7 cm    Olecranon Process  28.7 cm    15 cm Proximal to Ulnar Styloid Process  25.9 cm    10 cm Proximal to Ulnar Styloid Process  23.3 cm    Just Proximal to Ulnar Styloid Process  18.4 cm    Across Hand at [employer]  19.7 cm    At [place] of 2nd Digit  7.2 cm    Other  7.5    Other  44 cm long       Left Upper Extremity Lymphedema   15 cm Proximal to Olecranon Process  36 cm    10  cm Proximal to Olecranon Process  33.7 cm    Olecranon Process  29 cm    15 cm Proximal to Ulnar Styloid Process  25.5 cm    10 cm Proximal to Ulnar Styloid Process  23.2 cm    Just Proximal to Ulnar Styloid  Process  19 cm    Across Hand at [employer]  19.7 cm    At [place] of 2nd Digit  6.7 cm    Other  7.5             Outpatient Re[facility]  Lymphedema Life Impact Scale Total Score  52.94 %      Objective measurements completed on examination: See above findings.              PT Education - 05/27/19 1423    Education Details  POC, lymphedema, compression    Person(s) Educated  Patient    Methods  Explanation    Comprehension  Verbalized understanding          PT Long Term Goals - 05/27/19 1430      PT LONG TERM GOAL #1   Title  Pt will decrease pain with Rt arm movement to 3/10 or less    Baseline  10/10 on 9/3    Time  6    Period  Weeks    Status  New      PT LONG TERM GOAL #2   Title  Pt will be ind with home compression use and care    Time  6    Period  Weeks    Status  New      PT LONG TERM GOAL #3   Title  Pt will demonstrate Rt shoulder AROM WNL without pain more than 5/10    Time  6    Period  Weeks    Status  New      PT LONG TERM GOAL #4   Title  Pt will be able to put her bra on without difficulty    Time  6    Period  Weeks    Status  New             Plan - 05/27/19 1424    Clinical Impression Statement  Pt presents about 2 years out from breast surgery and radiation with a few months of Rt arm pain and reported swelling.  Did not assess ROM today, but pt exhibits overall Rt UE tenderness.  No observable difference in arms and only 1 location that is larger on measurements but due to new onset this may be undetectable.  Will send over measurements to Melissa from [employer] to see if pt fits into an OTS sleeves initially.  Also sent demographics and prescription today.  Pt reported that tg soft felt good.    Personal Factors and Comorbidities  Age;Fitness;Comorbidity 2    Comorbidities  radiation and lymph node removal    Examination-Activity Limitations   Lift;Carry;Dressing;Hygiene/Grooming    Examination-Participation Restrictions  Yard Work;Cleaning;Meal Prep;Community Activity;Laundry    Stability/Clinical Decision Making  Evolving/Moderate complexity    Clinical Decision Making  Moderate    Rehab Potential  Good    PT Frequency  2x / week    PT Duration  6 weeks    PT Treatment/Interventions  ADLs/Self Care Home Management;Therapeutic exercise;Patient/family education;Manual lymph drainage;Manual techniques;Compression bandaging;Passive range of motion;Taping    PT Next Visit Plan  how was tg soft? PT hear  from [place] on fit or insurance?, start MLD of the Rt UE and lateral trunk starting to teach pt for home,    Recommended Other Services  faxed demo to [facility], prescription to Dr, and Haematologist regarding fit    Consulted and Agree with Plan of Care  Patient       Patient will benefit from skilled therapeutic intervention in order to improve the following deficits and impairments:  Decreased knowledge of use of DME, Pain, Postural dysfunction, Decreased activity tolerance, Impaired UE functional use, Increased edema  Visit Diagnosis: Lymphedema  Pain in right arm  Abnormal posture     Problem List Patient Active Problem List   Diagnosis Date Noted  . Genetic testing 09/30/2017  . Family history of colon cancer   . Family history of breast cancer   . Malignant neoplasm of upper-outer quadrant of right breast in female, estrogen receptor positive ([place]) 08/12/2017  . Family history of malignant neoplasm of breast   . HYPERLIPIDEMIA 10/21/2008  . OBESITY 10/21/2008  . HYPERTENSION 10/21/2008  . ALLERGIC RHINITIS 10/21/2008  . DIVERTICULOSIS, COLON 10/21/2008  . FEVER UNSPECIFIED 10/14/2008  . OTHER ABNORMAL BLOOD CHEMISTRY 10/14/2008    Stark Bray 05/27/2019, 2:31 PM  [place], [place], [address] Phone: [phone]   Fax:   [phone]  Name: Jerusalen Mateja MRN: [id number] Date of Birth: 1941-04-26

## 2019-06-07 ENCOUNTER — Other Ambulatory Visit: Payer: Self-pay

## 2019-06-07 ENCOUNTER — Ambulatory Visit: Payer: Medicare Other

## 2019-06-07 DIAGNOSIS — M79601 Pain in right arm: Secondary | ICD-10-CM

## 2019-06-07 DIAGNOSIS — R293 Abnormal posture: Secondary | ICD-10-CM | POA: Diagnosis not present

## 2019-06-07 DIAGNOSIS — I890 Lymphedema, not elsewhere classified: Secondary | ICD-10-CM | POA: Diagnosis not present

## 2019-06-07 NOTE — Therapy (Signed)
[place], [place], [address] Phone: [phone]   Fax:  [phone]  Physical Therapy Treatment  Patient Details  Name: Cheyenne Lee MRN: [id number] Date of Birth: 01-17-1941 Referring Provider (PT): Dr. Lindi Adie   Encounter Date: 06/07/2019  PT End of Session - 06/07/19 0953    Visit Number  2    Number of Visits  9    Date for PT Re-Evaluation  07/08/19    PT Start Time  0902    PT Stop Time  0951    PT Time Calculation (min)  49 min    Activity Tolerance  Patient tolerated treatment well    Behavior During Therapy  [facility] for tasks assessed/performed       Past Medical History:  Diagnosis Date  . Arthritis    hips  . Breast cancer ([place]) 06/25/2017   Right breast  . Cancer ([place])    right breast DCIS  . Complication of anesthesia   . Family history of breast cancer   . Family history of colon cancer   . Family history of malignant neoplasm of breast   . Glaucoma   . H/O seasonal allergies   . Hypertension   . IBS (irritable bowel syndrome)   . PONV (postoperative nausea and vomiting)     Past Surgical History:  Procedure Laterality Date  . AXILLARY LYMPH NODE DISSECTION Right 07/29/2017   Procedure: AXILLARY LYMPH NODE DISSECTION;  Surgeon: Donnie Mesa, MD;  Location: [place];  Service: General;  Laterality: Right;  . AXILLARY SENTINEL NODE BIOPSY Right 07/29/2017   Procedure: RIGHT AXILLARY SENTINEL NODE BIOPSY;  Surgeon: Donnie Mesa, MD;  Location: [place];  Service: General;  Laterality: Right;  . BREAST BIOPSY     Multiple Biopsies, both breasts  . BREAST LUMPECTOMY WITH RADIOACTIVE SEED LOCALIZATION Right 07/15/2017   Procedure: RIGHT BREAST LUMPECTOMY WITH RADIOACTIVE SEED LOCALIZATION ERAS PATHWAY;  Surgeon: Donnie Mesa, MD;  Location: [place];  Service: General;  Laterality: Right;  LMA  . CESAREAN SECTION     x4     There were no vitals filed for this visit.  Subjective Assessment - 06/07/19 0907    Subjective  The TG soft really seemed to help my arm. I took it off when I went to sleep and I woke up and had to put it back on.    Pertinent History  Rt lumpectomy due to grade 2 IDC with DCIS on 07/15/17 ER/PR positive, HER2 negative with 0/7 lymph nodes negative. Radiation completed.  Currently on letrozole. Other history includes: obesity, HTN,    Patient Stated Goals  decrease the Rt arm pain    Currently in Pain?  Yes    Pain Score  8     Pain Location  Shoulder    Pain Orientation  Right;Upper    Pain Descriptors / Indicators  Sharp;Dull   sharp with movement   Pain Type  Chronic pain;Surgical pain    Pain Onset  More than a month ago    Pain Frequency  Constant    Aggravating Factors   always hurts with reaching    Pain Relieving Factors  massage, rest                  Outpatient Rehab from [facility]/27/2019 [facility]  Lymphedema Life Impact Scale Total Score  52.94 %  [facility] Adult PT Treatment/Exercise - 06/07/19 0001      Manual Therapy   Manual Therapy  Manual Lymphatic Drainage (MLD);Passive ROM    Manual therapy comments  Cut another TG soft for pt to have one to wash and one to wear as she reports great benefit from one issued last week.     Manual Lymphatic Drainage (MLD)  In Supine: Short neck, superficial and deep abdominals, Rt inguinal and Lt axillary nodes, Rt axillo-inguinal and anterior inter-axillary anastomosis, then Rt UE working from lateral upper to dorsal hand proximally to distal then retracing all steps beginning to instruct pt throughout.     Passive ROM  Tried gentlly into flexion and pt with very limited tolerance today, alot due to she is unable to completely relax so instructed her to really focus on trying to decrease guarded posture over next few days. She agreed.                   PT Long  Term Goals - 05/27/19 1430      PT LONG TERM GOAL #1   Title  Pt will decrease pain with Rt arm movement to 3/10 or less    Baseline  10/10 on 9/3    Time  6    Period  Weeks    Status  New      PT LONG TERM GOAL #2   Title  Pt will be ind with home compression use and care    Time  6    Period  Weeks    Status  New      PT LONG TERM GOAL #3   Title  Pt will demonstrate Rt shoulder AROM WNL without pain more than 5/10    Time  6    Period  Weeks    Status  New      PT LONG TERM GOAL #4   Title  Pt will be able to put her bra on without difficulty    Time  6    Period  Weeks    Status  New            Plan - 06/07/19 1884    Clinical Impression Statement  Pt did well with first session of manual lymph drainage and verbalized a good understanding of this asking appropriate questions throughout. She also reported some decrease pain after session. Issued a second TG soft so pt could have one to wash and one to wear. She did not tolerate and P/ROM well with increased pain but also tried this briefly at end of session. Fitter Lenna Sciara was present at clinic today and she reports did not have demo yet or pt so handed her that today and her office is to call pt about options for ordering.    Personal Factors and Comorbidities  Age;Fitness;Comorbidity 2    Comorbidities  radiation and lymph node removal    Examination-Activity Limitations  Lift;Carry;Dressing;Hygiene/Grooming    Examination-Participation Restrictions  Yard Work;Cleaning;Meal Prep;Community Activity;Laundry    Stability/Clinical Decision Making  Evolving/Moderate complexity    Rehab Potential  Good    PT Frequency  2x / week    PT Duration  6 weeks    PT Treatment/Interventions  ADLs/Self Care Home Management;Therapeutic exercise;Patient/family education;Manual lymph drainage;Manual techniques;Compression bandaging;Passive range of motion;Taping    PT Next Visit Plan  Did pt hear from [facility] about ordering garment?,  cont MLD of the Rt UE and lateral trunk and teach pt next session having her  return demo and issue handout. Then transition to pulleys, supine dowel exs once pt becomes independent with maintenance pf lymphedema.    Consulted and Agree with Plan of Care  Patient       Patient will benefit from skilled therapeutic intervention in order to improve the following deficits and impairments:  Decreased knowledge of use of DME, Pain, Postural dysfunction, Decreased activity tolerance, Impaired UE functional use, Increased edema  Visit Diagnosis: Lymphedema  Pain in right arm  Abnormal posture     Problem List Patient Active Problem List   Diagnosis Date Noted  . Genetic testing 09/30/2017  . Family history of colon cancer   . Family history of breast cancer   . Malignant neoplasm of upper-outer quadrant of right breast in female, estrogen receptor positive ([place]) 08/12/2017  . Family history of malignant neoplasm of breast   . HYPERLIPIDEMIA 10/21/2008  . OBESITY 10/21/2008  . HYPERTENSION 10/21/2008  . ALLERGIC RHINITIS 10/21/2008  . DIVERTICULOSIS, COLON 10/21/2008  . FEVER UNSPECIFIED 10/14/2008  . OTHER ABNORMAL BLOOD CHEMISTRY 10/14/2008    Otelia Limes, PTA 06/07/2019, 10:22 AM  [place], [place], [address] Phone: [phone]   Fax:  [phone]  Name: Cheyenne Lee MRN: [id number] Date of Birth: Mar 12, 1941

## 2019-06-10 ENCOUNTER — Other Ambulatory Visit: Payer: Self-pay

## 2019-06-10 ENCOUNTER — Ambulatory Visit: Payer: Medicare Other

## 2019-06-10 DIAGNOSIS — R293 Abnormal posture: Secondary | ICD-10-CM | POA: Diagnosis not present

## 2019-06-10 DIAGNOSIS — M79601 Pain in right arm: Secondary | ICD-10-CM

## 2019-06-10 DIAGNOSIS — I890 Lymphedema, not elsewhere classified: Secondary | ICD-10-CM | POA: Diagnosis not present

## 2019-06-10 NOTE — Therapy (Signed)
[place], [place], [address] Phone: [phone]   Fax:  [phone]  Physical Therapy Treatment  Patient Details  Name: Cheyenne Lee MRN: [id number] Date of Birth: 03/26/1941 Referring Provider (PT): Dr. Lindi Adie   Encounter Date: 06/10/2019  PT End of Session - 06/10/19 1032    Visit Number  3    Number of Visits  9    Date for PT Re-Evaluation  07/08/19    Authorization Type  medicare/BCBS    PT Start Time  0908    PT Stop Time  0955    PT Time Calculation (min)  47 min    Activity Tolerance  Patient tolerated treatment well    Behavior During Therapy  [facility] for tasks assessed/performed       Past Medical History:  Diagnosis Date  . Arthritis    hips  . Breast cancer ([place]) 06/25/2017   Right breast  . Cancer ([place])    right breast DCIS  . Complication of anesthesia   . Family history of breast cancer   . Family history of colon cancer   . Family history of malignant neoplasm of breast   . Glaucoma   . H/O seasonal allergies   . Hypertension   . IBS (irritable bowel syndrome)   . PONV (postoperative nausea and vomiting)     Past Surgical History:  Procedure Laterality Date  . AXILLARY LYMPH NODE DISSECTION Right 07/29/2017   Procedure: AXILLARY LYMPH NODE DISSECTION;  Surgeon: Donnie Mesa, MD;  Location: [place];  Service: General;  Laterality: Right;  . AXILLARY SENTINEL NODE BIOPSY Right 07/29/2017   Procedure: RIGHT AXILLARY SENTINEL NODE BIOPSY;  Surgeon: Donnie Mesa, MD;  Location: [place];  Service: General;  Laterality: Right;  . BREAST BIOPSY     Multiple Biopsies, both breasts  . BREAST LUMPECTOMY WITH RADIOACTIVE SEED LOCALIZATION Right 07/15/2017   Procedure: RIGHT BREAST LUMPECTOMY WITH RADIOACTIVE SEED LOCALIZATION ERAS PATHWAY;  Surgeon: Donnie Mesa, MD;  Location: [place];  Service: General;  Laterality: Right;   LMA  . CESAREAN SECTION     x4    There were no vitals filed for this visit.  Subjective Assessment - 06/10/19 0947    Subjective  Pt states that she is feeling much better her pain is down to a 7/10 and reports that she is able to move her arm much better.    Pertinent History  Rt lumpectomy due to grade 2 IDC with DCIS on 07/15/17 ER/PR positive, HER2 negative with 0/7 lymph nodes negative. Radiation completed.  Currently on letrozole. Other history includes: obesity, HTN,    Patient Stated Goals  decrease the Rt arm pain    Currently in Pain?  Yes    Pain Score  7     Pain Location  Shoulder    Pain Descriptors / Indicators  Sharp;Dull    Pain Onset  More than a month ago    Pain Frequency  Constant    Aggravating Factors   reaching    Pain Relieving Factors  massage, rest    Effect of Pain on Daily Activities  I can't hold much or lift things                  Outpatient Rehab from [facility]27/[facility]  Lymphedema Life Impact Scale Total Score  52.94 %           [facility]  Adult PT Treatment/Exercise - 06/10/19 0001      Manual Therapy   Manual Therapy  Manual Lymphatic Drainage (MLD);Passive ROM    Manual Lymphatic Drainage (MLD)  In supine, Short neck, superficial abdominals and entire anterior trunk sequence. B inguinal and axillary nodes, B axillary-inguinal anastomosis and inter-axillary anastomosis toward the L. Arm sequencing w/medial to lateral then proximal to distal followed by final retracing of entire sequence.    Passive ROM  PROM into flexion, abd, ER, IR with oscillations for pain management pt tolerated well.              PT Education - 06/10/19 1031    Education Details  Pt was educated to perform counter flexion stretch when she is in the kitchen preparing meals. Discussed the use of compression garment including removing at night and using TG soft at night. Pt was provided w/address for garment store.     Person(s) Educated  Patient    Methods  Demonstration;Explanation    Comprehension  Verbalized understanding;Returned demonstration          PT Long Term Goals - 05/27/19 1430      PT LONG TERM GOAL #1   Title  Pt will decrease pain with Rt arm movement to 3/10 or less    Baseline  10/10 on 9/3    Time  6    Period  Weeks    Status  New      PT LONG TERM GOAL #2   Title  Pt will be ind with home compression use and care    Time  6    Period  Weeks    Status  New      PT LONG TERM GOAL #3   Title  Pt will demonstrate Rt shoulder AROM WNL without pain more than 5/10    Time  6    Period  Weeks    Status  New      PT LONG TERM GOAL #4   Title  Pt will be able to put her bra on without difficulty    Time  6    Period  Weeks    Status  New            Plan - 06/10/19 1033    Clinical Impression Statement  Pt was able to tolerate PROM this session with easy oscillations for all movements to improve tolerance. Full sequence MLD was performed this session w/o any increase in pain or discomfort. Anterior/lateral brachium feel more dense than the rest of the RUE; extra focus on this area during MLD today. Pt will benefit from continued POC.    Personal Factors and Comorbidities  Age;Fitness;Comorbidity 2    Comorbidities  radiation and lymph node removal    Examination-Activity Limitations  Lift;Carry;Dressing;Hygiene/Grooming    Examination-Participation Restrictions  Yard Work;Cleaning;Meal Prep;Community Activity;Laundry    Stability/Clinical Decision Making  Evolving/Moderate complexity    Rehab Potential  Good    PT Frequency  2x / week    PT Duration  6 weeks    PT Treatment/Interventions  ADLs/Self Care Home Management;Therapeutic exercise;Patient/family education;Manual lymph drainage;Manual techniques;Compression bandaging;Passive range of motion;Taping    PT Next Visit Plan  Continue with PROM, possibly add isometric rotator cuff exercises and dowel exercises.  Assess counter shoulder flexion stretch.    PT Home Exercise Plan  Perform counter flexion stretch for the shoulder.    Consulted and Agree with Plan of Care  Patient  Patient will benefit from skilled therapeutic intervention in order to improve the following deficits and impairments:  Decreased knowledge of use of DME, Pain, Postural dysfunction, Decreased activity tolerance, Impaired UE functional use, Increased edema  Visit Diagnosis: Lymphedema  Pain in right arm  Abnormal posture     Problem List Patient Active Problem List   Diagnosis Date Noted  . Genetic testing 09/30/2017  . Family history of colon cancer   . Family history of breast cancer   . Malignant neoplasm of upper-outer quadrant of right breast in female, estrogen receptor positive ([place]) 08/12/2017  . Family history of malignant neoplasm of breast   . HYPERLIPIDEMIA 10/21/2008  . OBESITY 10/21/2008  . HYPERTENSION 10/21/2008  . ALLERGIC RHINITIS 10/21/2008  . DIVERTICULOSIS, COLON 10/21/2008  . FEVER UNSPECIFIED 10/14/2008  . OTHER ABNORMAL BLOOD CHEMISTRY 10/14/2008    Ander Purpura, PT 06/10/2019, 1:00 PM  [place], [place], [address] Phone: [phone]   Fax:  [phone]  Name: Bob Eastwood MRN: [id number] Date of Birth: 16-Oct-1940

## 2019-06-14 ENCOUNTER — Other Ambulatory Visit: Payer: Self-pay

## 2019-06-14 ENCOUNTER — Ambulatory Visit: Payer: Medicare Other

## 2019-06-14 DIAGNOSIS — I890 Lymphedema, not elsewhere classified: Secondary | ICD-10-CM

## 2019-06-14 DIAGNOSIS — M79601 Pain in right arm: Secondary | ICD-10-CM | POA: Diagnosis not present

## 2019-06-14 DIAGNOSIS — R293 Abnormal posture: Secondary | ICD-10-CM

## 2019-06-14 NOTE — Patient Instructions (Addendum)
MAKE SURE NOT TO LEAN WITH BODY. Flexion (Isometric)      Cancer Rehab [phone]    Press right fist against wall. Hold __5__ seconds.  Repeat _5-10___ times. Do __1-2__ sessions per day.  SHOULDER: Abduction (Isometric)    Use wall as resistance. Press arm against pillow. Hold _5__ seconds. _5-10__ times. Do _1-2__ sessions per day.    External Rotation (Isometric)    Place back of right fist against door frame, with elbow bent. Should feel elbow pressing into side. Press fist against door frame. Hold __5__ seconds. Repeat _5-10___ times. Do _1-2___ sessions per day.  Extension (Isometric)    Place right bent elbow and back of arm against wall. Press elbow against wall and shoulder blade towards spine. Hold __5__ seconds. Repeat _5-10___ times. Do _1-2___ sessions per day.

## 2019-06-14 NOTE — Therapy (Signed)
[place], [place], [address] Phone: [phone]   Fax:  [phone]  Physical Therapy Treatment  Patient Details  Name: Cheyenne Lee MRN: [id number] Date of Birth: 10-24-1940 Referring Provider (PT): Dr. Lindi Adie   Encounter Date: 06/14/2019  PT End of Session - 06/14/19 0953    Visit Number  4    Number of Visits  9    Date for PT Re-Evaluation  07/08/19    PT Start Time  0903    PT Stop Time  0952    PT Time Calculation (min)  49 min    Activity Tolerance  Patient tolerated treatment well    Behavior During Therapy  [facility] for tasks assessed/performed       Past Medical History:  Diagnosis Date  . Arthritis    hips  . Breast cancer ([place]) 06/25/2017   Right breast  . Cancer ([place])    right breast DCIS  . Complication of anesthesia   . Family history of breast cancer   . Family history of colon cancer   . Family history of malignant neoplasm of breast   . Glaucoma   . H/O seasonal allergies   . Hypertension   . IBS (irritable bowel syndrome)   . PONV (postoperative nausea and vomiting)     Past Surgical History:  Procedure Laterality Date  . AXILLARY LYMPH NODE DISSECTION Right 07/29/2017   Procedure: AXILLARY LYMPH NODE DISSECTION;  Surgeon: Donnie Mesa, MD;  Location: [facility];  Service: General;  Laterality: Right;  . AXILLARY SENTINEL NODE BIOPSY Right 07/29/2017   Procedure: RIGHT AXILLARY SENTINEL NODE BIOPSY;  Surgeon: Donnie Mesa, MD;  Location: [facility];  Service: General;  Laterality: Right;  . BREAST BIOPSY     Multiple Biopsies, both breasts  . BREAST LUMPECTOMY WITH RADIOACTIVE SEED LOCALIZATION Right 07/15/2017   Procedure: RIGHT BREAST LUMPECTOMY WITH RADIOACTIVE SEED LOCALIZATION ERAS PATHWAY;  Surgeon: Donnie Mesa, MD;  Location: [facility] [place];  Service: General;  Laterality: Right;  LMA  . CESAREAN SECTION     x4     There were no vitals filed for this visit.  Subjective Assessment - 06/14/19 0903    Subjective  I got my compression sleeve when I was here last. It has been helping alot with my Rt arm achiness. And I've been wearing the TG soft at night so I can tell my arm is feeling better.    Pertinent History  Rt lumpectomy due to grade 2 IDC with DCIS on 07/15/17 ER/PR positive, HER2 negative with 0/7 lymph nodes negative. Radiation completed.  Currently on letrozole. Other history includes: obesity, HTN,    Patient Stated Goals  decrease the Rt arm pain    Currently in Pain?  Yes    Pain Score  6     Pain Location  Arm    Pain Orientation  Right;Upper    Pain Descriptors / Indicators  Aching    Pain Type  Chronic pain;Surgical pain    Pain Onset  More than a month ago    Pain Frequency  Intermittent    Aggravating Factors   reaching    Pain Relieving Factors  the sleeve                  [facility]patient [facility]  Lymphedema Life Impact Scale Total Score  52.94 %  [facility] Adult PT Treatment/Exercise - 06/14/19 0001      Shoulder Exercises: Isometric Strengthening   Flexion  5X5"   Rt shoulder with 5 sec holds in doorway   Extension  5X5"   Rt UE, 5 sec holds in doorway   Extension Limitations  focused on scapular retraction as well at end ROM    External Rotation  5X5"   Rt UE for 5 sec holds in doorway   ABduction  5X5"   Rt Ue for 5 sec holds in doorway   ABduction Limitations  Pt returned therapist demo for each above      Manual Therapy   Manual Lymphatic Drainage (MLD)  In Supine: Short neck, superficial and deep abdominals, Rt inguinal and Lt axillary nodes, Rt axillo-inguinal and anterior inter-axillary anastomosis, then Rt UE working from lateral upper arm to dorsum of hand proximal to distal then retracing all steps.     Passive ROM  PROM into flexion, abd, ER with oscillations for pain  management, pt with improved tolerance since last week though pt still with pain at end motions.               PT Education - 06/14/19 0909    Education Details  Rt shoulder isometrics    Person(s) Educated  Patient    Methods  Explanation;Demonstration;Handout    Comprehension  Verbalized understanding;Returned demonstration;Need further instruction          PT Long Term Goals - 05/27/19 1430      PT LONG TERM GOAL #1   Title  Pt will decrease pain with Rt arm movement to 3/10 or less    Baseline  10/10 on 9/3    Time  6    Period  Weeks    Status  New      PT LONG TERM GOAL #2   Title  Pt will be ind with home compression use and care    Time  6    Period  Weeks    Status  New      PT LONG TERM GOAL #3   Title  Pt will demonstrate Rt shoulder AROM WNL without pain more than 5/10    Time  6    Period  Weeks    Status  New      PT LONG TERM GOAL #4   Title  Pt will be able to put her bra on without difficulty    Time  6    Period  Weeks    Status  New            Plan - 06/14/19 9977    Clinical Impression Statement  Progressed pts HEP to include Isometric strength. She tolerated this very well without any increased pain. Did require VCs to not lean with body for motions, but then she was able to return good demo. Pt got her new compression sleeve after last session last week and it is some difficult to get on, but pt is able to do this independently and reports garment being very comfortable once on. Pt reports her pain has decreased to achiness in upper arm over the weekend since getting and wearing the sleeve daily.    Personal Factors and Comorbidities  Age;Fitness;Comorbidity 2    Comorbidities  radiation and lymph node removal    Examination-Activity Limitations  Lift;Carry;Dressing;Hygiene/Grooming    Examination-Participation Restrictions  Yard Work;Cleaning;Meal Prep;Community Activity;Laundry    Stability/Clinical Decision Making   Evolving/Moderate complexity  Rehab Potential  Good    PT Frequency  2x / week    PT Duration  6 weeks    PT Treatment/Interventions  ADLs/Self Care Home Management;Therapeutic exercise;Patient/family education;Manual lymph drainage;Manual techniques;Compression bandaging;Passive range of motion;Taping    PT Next Visit Plan  Continue with PROM and MLD, review isometric rotator cuff exercises and try supine dowel exercises.    PT Home Exercise Plan  Perform counter flexion stretch for the shoulder; isometric strength    Consulted and Agree with Plan of Care  Patient       Patient will benefit from skilled therapeutic intervention in order to improve the following deficits and impairments:  Decreased knowledge of use of DME, Pain, Postural dysfunction, Decreased activity tolerance, Impaired UE functional use, Increased edema  Visit Diagnosis: Lymphedema  Pain in right arm  Abnormal posture     Problem List Patient Active Problem List   Diagnosis Date Noted  . Genetic testing 09/30/2017  . Family history of colon cancer   . Family history of breast cancer   . Malignant neoplasm of upper-outer quadrant of right breast in female, estrogen receptor positive ([place]) 08/12/2017  . Family history of malignant neoplasm of breast   . HYPERLIPIDEMIA 10/21/2008  . OBESITY 10/21/2008  . HYPERTENSION 10/21/2008  . ALLERGIC RHINITIS 10/21/2008  . DIVERTICULOSIS, COLON 10/21/2008  . FEVER UNSPECIFIED 10/14/2008  . OTHER ABNORMAL BLOOD CHEMISTRY 10/14/2008    Otelia Limes, PTA 06/14/2019, 10:14 AM  [place], [place], [address] Phone: [phone]   Fax:  [phone]  Name: Cheyenne Lee MRN: [id number] Date of Birth: Aug 01, 1941

## 2019-06-16 ENCOUNTER — Ambulatory Visit: Payer: Medicare Other

## 2019-06-16 ENCOUNTER — Other Ambulatory Visit: Payer: Self-pay

## 2019-06-16 DIAGNOSIS — M79601 Pain in right arm: Secondary | ICD-10-CM

## 2019-06-16 DIAGNOSIS — R293 Abnormal posture: Secondary | ICD-10-CM

## 2019-06-16 DIAGNOSIS — I890 Lymphedema, not elsewhere classified: Secondary | ICD-10-CM

## 2019-06-16 DIAGNOSIS — L309 Dermatitis, unspecified: Secondary | ICD-10-CM | POA: Diagnosis not present

## 2019-06-16 NOTE — Therapy (Signed)
[place], [place], [address] Phone: [phone]   Fax:  [phone]  Physical Therapy Treatment  Patient Details  Name: Cheyenne Lee MRN: [id number] Date of Birth: 1941-06-21 Referring Provider (PT): Dr. Lindi Adie  NO CHARGE/NO TREATMENT TODAY. SEE BELOW.  Encounter Date: 06/16/2019  PT End of Session - 06/16/19 0757    Visit Number  4   visit # unchanged due to no treatment today   Number of Visits  9    Date for PT Re-Evaluation  07/08/19    PT Start Time  0905    PT Stop Time  0920   no charge today, sent pt to MD   PT Time Calculation (min)  15 min    Activity Tolerance  Treatment limited secondary to medical complications (Comment)    Behavior During Therapy  Anxious       Past Medical History:  Diagnosis Date  . Arthritis    hips  . Breast cancer ([place]) 06/25/2017   Right breast  . Cancer ([place])    right breast DCIS  . Complication of anesthesia   . Family history of breast cancer   . Family history of colon cancer   . Family history of malignant neoplasm of breast   . Glaucoma   . H/O seasonal allergies   . Hypertension   . IBS (irritable bowel syndrome)   . PONV (postoperative nausea and vomiting)     Past Surgical History:  Procedure Laterality Date  . AXILLARY LYMPH NODE DISSECTION Right 07/29/2017   Procedure: AXILLARY LYMPH NODE DISSECTION;  Surgeon: Donnie Mesa, MD;  Location: [facility];  Service: General;  Laterality: Right;  . AXILLARY SENTINEL NODE BIOPSY Right 07/29/2017   Procedure: RIGHT AXILLARY SENTINEL NODE BIOPSY;  Surgeon: Donnie Mesa, MD;  Location: [facility];  Service: General;  Laterality: Right;  . BREAST BIOPSY     Multiple Biopsies, both breasts  . BREAST LUMPECTOMY WITH RADIOACTIVE SEED LOCALIZATION Right 07/15/2017   Procedure: RIGHT BREAST LUMPECTOMY WITH RADIOACTIVE SEED LOCALIZATION ERAS PATHWAY;  Surgeon: Donnie Mesa, MD;  Location: [facility];  Service: General;  Laterality: Right;  LMA  . CESAREAN SECTION     x4    There were no vitals filed for this visit.  Subjective Assessment - 06/16/19 0925    Subjective  I started itching real bad after my last visit in my Rt under arm (axilla) and I have some redness on my chest now. I put some calamine lotion on it and it helped the itching some. I've shingles twice before and I'm not sure if I'm getting it again. My Rt shoulder/arm pain is much improved though since starting to wear the sleeve and do the exercises, I just don't knowhwat's going on with these new symptoms.    Pertinent History  Rt lumpectomy due to grade 2 IDC with DCIS on 07/15/17 ER/PR positive, HER2 negative with 0/7 lymph nodes negative. Radiation completed.  Currently on letrozole. Other history includes: obesity, HTN,    Patient Stated Goals  decrease the Rt arm pain    Currently in Pain?  No/denies                  Outpatient Rehab from [facility]27/[facility]  Lymphedema Life Impact Scale Total Score  52.94 %  PT Long Term Goals - 05/27/19 1430      PT LONG TERM GOAL #1   Title  Pt will decrease pain with Rt arm movement to 3/10 or less    Baseline  10/10 on 9/3    Time  6    Period  Weeks    Status  New      PT LONG TERM GOAL #2   Title  Pt will be ind with home compression use and care    Time  6    Period  Weeks    Status  New      PT LONG TERM GOAL #3   Title  Pt will demonstrate Rt shoulder AROM WNL without pain more than 5/10    Time  6    Period  Weeks    Status  New      PT LONG TERM GOAL #4   Title  Pt will be able to put her bra on without difficulty    Time  6    Period  Weeks    Status  New            Plan - 06/16/19 8413    Clinical Impression Statement  Pt came in today c/o new itchiness and redness at Rt axilla and very small bumps  noticeable upon pt pointing them out to therapist. Also min redness visbile across pts chest. She says she has had shingles 2x before and is worried she is getting it again. Therapist concerned of potential of infection being a possibility as well. After speaking with supervisor Leone Payor, PT, it was determined best course of action would be for pt to call her PCP and not treat pt today. Pt was agreeable to this. She was not wearing her compression sleeve today and instructed her not to until her PCP can see her.    Personal Factors and Comorbidities  Age;Fitness;Comorbidity 2    Comorbidities  radiation and lymph node removal    Examination-Activity Limitations  Lift;Carry;Dressing;Hygiene/Grooming    Examination-Participation Restrictions  Yard Work;Cleaning;Meal Prep;Community Activity;Laundry    Stability/Clinical Decision Making  Evolving/Moderate complexity    Rehab Potential  Good    PT Frequency  2x / week    PT Duration  6 weeks    PT Treatment/Interventions  ADLs/Self Care Home Management;Therapeutic exercise;Patient/family education;Manual lymph drainage;Manual techniques;Compression bandaging;Passive range of motion;Taping    PT Next Visit Plan  See what MD said about pts new onest of symptoms. Continue with PROM and MLD, review isometric rotator cuff exercises and try supine dowel exercises.    PT Home Exercise Plan  Perform counter flexion stretch for the shoulder; isometric strength    Consulted and Agree with Plan of Care  Patient       Patient will benefit from skilled therapeutic intervention in order to improve the following deficits and impairments:  Decreased knowledge of use of DME, Pain, Postural dysfunction, Decreased activity tolerance, Impaired UE functional use, Increased edema  Visit Diagnosis: Lymphedema  Pain in right arm  Abnormal posture     Problem List Patient Active Problem List   Diagnosis Date Noted  . Genetic testing 09/30/2017  . Family history  of colon cancer   . Family history of breast cancer   . Malignant neoplasm of upper-outer quadrant of right breast in female, estrogen receptor positive ([place]) 08/12/2017  . Family history of malignant neoplasm of breast   . HYPERLIPIDEMIA 10/21/2008  . OBESITY 10/21/2008  . HYPERTENSION 10/21/2008  .  ALLERGIC RHINITIS 10/21/2008  . DIVERTICULOSIS, COLON 10/21/2008  . FEVER UNSPECIFIED 10/14/2008  . OTHER ABNORMAL BLOOD CHEMISTRY 10/14/2008    Otelia Limes, PTA 06/16/2019, 9:34 AM  [place], [place], [address] Phone: [phone]   Fax:  [phone]  Name: Radonna Bracher MRN: [id number] Date of Birth: 08-19-1941

## 2019-06-21 ENCOUNTER — Ambulatory Visit
Admission: RE | Admit: 2019-06-21 | Discharge: 2019-06-21 | Disposition: A | Discharge status: Home / Self Care | Payer: Medicare Other | Source: Ambulatory Visit | Attending: Hematology and Oncology | Admitting: Hematology and Oncology

## 2019-06-21 ENCOUNTER — Other Ambulatory Visit: Payer: Self-pay

## 2019-06-21 DIAGNOSIS — R928 Other abnormal and inconclusive findings on diagnostic imaging of breast: Secondary | ICD-10-CM | POA: Diagnosis not present

## 2019-06-21 DIAGNOSIS — Z9889 Other specified postprocedural states: Secondary | ICD-10-CM

## 2019-06-21 HISTORY — DX: Personal history of irradiation: Z92.3

## 2019-06-23 ENCOUNTER — Ambulatory Visit: Payer: Medicare Other

## 2019-06-23 ENCOUNTER — Other Ambulatory Visit: Payer: Self-pay

## 2019-06-23 DIAGNOSIS — R293 Abnormal posture: Secondary | ICD-10-CM

## 2019-06-23 DIAGNOSIS — M79601 Pain in right arm: Secondary | ICD-10-CM | POA: Diagnosis not present

## 2019-06-23 DIAGNOSIS — I890 Lymphedema, not elsewhere classified: Secondary | ICD-10-CM | POA: Diagnosis not present

## 2019-06-23 NOTE — Therapy (Signed)
[place], [place], [address] Phone: [phone]   Fax:  [phone]  Physical Therapy Treatment  Patient Details  Name: Cheyenne Lee MRN: [id number] Date of Birth: July 28, 1941 Referring Provider (PT): Dr. Lindi Adie   Encounter Date: 06/23/2019  PT End of Session - 06/23/19 1049    Visit Number  5    Number of Visits  9    Date for PT Re-Evaluation  07/08/19    PT Start Time  1003    PT Stop Time  1048    PT Time Calculation (min)  45 min    Activity Tolerance  Patient tolerated treatment well    Behavior During Therapy  [facility] for tasks assessed/performed       Past Medical History:  Diagnosis Date  . Arthritis    hips  . Breast cancer ([place]) 06/25/2017   Right breast  . Cancer ([place])    right breast DCIS  . Complication of anesthesia   . Family history of breast cancer   . Family history of colon cancer   . Family history of malignant neoplasm of breast   . Glaucoma   . H/O seasonal allergies   . Hypertension   . IBS (irritable bowel syndrome)   . Personal history of radiation therapy   . PONV (postoperative nausea and vomiting)     Past Surgical History:  Procedure Laterality Date  . AXILLARY LYMPH NODE DISSECTION Right 07/29/2017   Procedure: AXILLARY LYMPH NODE DISSECTION;  Surgeon: Donnie Mesa, MD;  Location: [facility];  Service: General;  Laterality: Right;  . AXILLARY SENTINEL NODE BIOPSY Right 07/29/2017   Procedure: RIGHT AXILLARY SENTINEL NODE BIOPSY;  Surgeon: Donnie Mesa, MD;  Location: [facility];  Service: General;  Laterality: Right;  . BREAST BIOPSY     Multiple Biopsies, both breasts  . BREAST LUMPECTOMY Right 2018  . BREAST LUMPECTOMY WITH RADIOACTIVE SEED LOCALIZATION Right 07/15/2017   Procedure: RIGHT BREAST LUMPECTOMY WITH RADIOACTIVE SEED LOCALIZATION ERAS PATHWAY;  Surgeon: Donnie Mesa, MD;  Location: [facility];   Service: General;  Laterality: Right;  LMA  . CESAREAN SECTION     x4    There were no vitals filed for this visit.  Subjective Assessment - 06/23/19 1005    Subjective  I went to my doctors office and they confirmed my mew symptoms were just irritation so that's good. I am only wearing my sleeve occassionally now since it irritates but my arm does seem to be doing better. I was having 3 areas of pain in my Rt upper arm but now it 's only one.    Pertinent History  Rt lumpectomy due to grade 2 IDC with DCIS on 07/15/17 ER/PR positive, HER2 negative with 0/7 lymph nodes negative. Radiation completed.  Currently on letrozole. Other history includes: obesity, HTN,    Patient Stated Goals  decrease the Rt arm pain    Currently in Pain?  Yes    Pain Score  6     Pain Location  Arm    Pain Orientation  Right;Upper    Pain Descriptors / Indicators  Other (Comment)   pulling   Pain Type  Chronic pain;Surgical pain    Pain Onset  More than a month ago    Pain Frequency  Intermittent    Aggravating Factors   reaching    Pain Relieving Factors  the sleeve  Outpatient Re[facility] from [facility]  Lymphedema Life Impact Scale Total Score  52.94 %           [facility] Adult PT Treatment/Exercise - 06/23/19 0001      Shoulder Exercises: Standing   External Rotation  Strengthening;Right;5 reps;Theraband    Theraband Level (Shoulder External Rotation)  Level 1 (Yellow)    Internal Rotation  Strengthening;Right;5 reps;Theraband    Theraband Level (Shoulder Internal Rotation)  Level 1 (Yellow)    Flexion  Strengthening;Right;5 reps;Theraband    Theraband Level (Shoulder Flexion)  Level 1 (Yellow)    Flexion Limitations  some pain with this so pt may cont isometrically at home, but then tried a few again at end and pt with minimal pain so instructed to cont only if no to very min pain and she agreed    USAA reps;Theraband    Theraband Level (Shoulder Extension)  Level 1 (Yellow)      Shoulder Exercises: Pulleys   Scaption  2 minutes    Scaption Limitations  Pt returned therapist demo and VCs during to decrease Rt scapular compensation    ABduction  2 minutes    ABduction Limitations  Pt returned therapist demo and VCs throughout to decrease Rt scapular compensation      Manual Therapy   Manual Lymphatic Drainage (MLD)  In Supine: Short neck, superficial and deep abdominals, Rt inguinal and Lt axillary nodes, Rt axillo-inguinal and anterior inter-axillary anastomosis, then Rt UE working from lateral upper arm to dorsum of hand proximal to distal then retracing all steps.     Passive ROM  PROM into flexion, abd, ER with oscillations for pain management, pt with improved tolerance though still requiring very slow motions so as not to tense up             PT Education - 06/23/19 1047    Education Details  Rt UE [place] with yellow theraband    Person(s) Educated  Patient    Methods  Explanation;Demonstration;Handout    Comprehension  Verbalized understanding;Returned demonstration;Need further instruction          PT Long Term Goals - 05/27/19 1430      PT LONG TERM GOAL #1   Title  Pt will decrease pain with Rt arm movement to 3/10 or less    Baseline  10/10 on 9/3    Time  6    Period  Weeks    Status  New      PT LONG TERM GOAL #2   Title  Pt will be ind with home compression use and care    Time  6    Period  Weeks    Status  New      PT LONG TERM GOAL #3   Title  Pt will demonstrate Rt shoulder AROM WNL without pain more than 5/10    Time  6    Period  Weeks    Status  New      PT LONG TERM GOAL #4   Title  Pt will be able to put her bra on without difficulty    Time  6    Period  Weeks    Status  New            Plan - 06/23/19 1049    Clinical Impression Statement  Resumed treatment today as visit to doctor dtermined no active  infection or shingles as pt was worried about. Continued with manual  therapy including MLD and P/ROM. Pt is still very limited by pain and requires slow, gentle ROM with oscillations for relaxation during P/ROM but her end motion is improving. Progressed HEP to include [place] which she overall tolareted well, some pain initally with flexion but this seemed improved after second attempt at end of session. Pt understands not to push into pain and cont isomoetrics if deemed painful, holding off on flexion with theraband prn.    Personal Factors and Comorbidities  Age;Fitness;Comorbidity 2    Comorbidities  radiation and lymph node removal    Examination-Activity Limitations  Lift;Carry;Dressing;Hygiene/Grooming    Examination-Participation Restrictions  Yard Work;Cleaning;Meal Prep;Community Activity;Laundry    Stability/Clinical Decision Making  Evolving/Moderate complexity    Rehab Potential  Good    PT Frequency  2x / week    PT Duration  6 weeks    PT Treatment/Interventions  ADLs/Self Care Home Management;Therapeutic exercise;Patient/family education;Manual lymph drainage;Manual techniques;Compression bandaging;Passive range of motion;Taping    PT Next Visit Plan  Continue with PROM and MLD, review [place] and try supine dowel exercises. Remeasure A/ROM.    PT Home Exercise Plan  Perform counter flexion stretch for the shoulder; isometric strength; [place] with yellow theraband in pain free range    Consulted and Agree with Plan of Care  Patient       Patient will benefit from skilled therapeutic intervention in order to improve the following deficits and impairments:  Decreased knowledge of use of DME, Pain, Postural dysfunction, Decreased activity tolerance, Impaired UE functional use, Increased edema  Visit Diagnosis: Lymphedema  Pain in right arm  Abnormal posture     Problem List Patient Active Problem List   Diagnosis Date Noted  . Genetic testing 09/30/2017  . Family  history of colon cancer   . Family history of breast cancer   . Malignant neoplasm of upper-outer quadrant of right breast in female, estrogen receptor positive ([place]) 08/12/2017  . Family history of malignant neoplasm of breast   . HYPERLIPIDEMIA 10/21/2008  . OBESITY 10/21/2008  . HYPERTENSION 10/21/2008  . ALLERGIC RHINITIS 10/21/2008  . DIVERTICULOSIS, COLON 10/21/2008  . FEVER UNSPECIFIED 10/14/2008  . OTHER ABNORMAL BLOOD CHEMISTRY 10/14/2008    Otelia Limes, PTA 06/23/2019, 10:54 AM  [place], [place], [address] Phone: [phone]   Fax:  [phone]  Name: Cheyenne Lee MRN: [id number] Date of Birth: August 23, 1941

## 2019-06-23 NOTE — Patient Instructions (Signed)

## 2019-06-25 ENCOUNTER — Ambulatory Visit: Payer: Medicare Other | Attending: Hematology and Oncology

## 2019-06-25 ENCOUNTER — Other Ambulatory Visit: Payer: Self-pay

## 2019-06-25 DIAGNOSIS — R293 Abnormal posture: Secondary | ICD-10-CM | POA: Diagnosis not present

## 2019-06-25 DIAGNOSIS — I890 Lymphedema, not elsewhere classified: Secondary | ICD-10-CM

## 2019-06-25 DIAGNOSIS — M79601 Pain in right arm: Secondary | ICD-10-CM | POA: Diagnosis not present

## 2019-06-25 NOTE — Patient Instructions (Signed)
Access Code: [email or web address]  URL: [email or web address]  Date: 06/25/2019  Prepared by: Tomma Rakers   Exercises Supine Shoulder Flexion with Dowel - 10 reps - 1 sets - 1x daily - 7x weekly Supine Shoulder Press with Dowel - 10 reps - 1 sets - 1x daily - 7x weekly Supine Chest Stretch with Elbows Bent - 5 reps - 1 sets - 5 seconds hold - 1x daily - 7x weekly

## 2019-06-25 NOTE — Therapy (Signed)
[place], [place], [address] Phone: [phone]   Fax:  [phone]  Physical Therapy Treatment  Patient Details  Name: Cheyenne Lee MRN: [id number] Date of Birth: 10-08-1940 Referring Provider (PT): Dr. Lindi Adie   Encounter Date: 06/25/2019  PT End of Session - 06/25/19 1105    Visit Number  6    Number of Visits  9    Date for PT Re-Evaluation  07/08/19    Authorization Type  medicare/BCBS    PT Start Time  1103    PT Stop Time  1203    PT Time Calculation (min)  60 min    Activity Tolerance  Patient tolerated treatment well    Behavior During Therapy  [facility] for tasks assessed/performed       Past Medical History:  Diagnosis Date  . Arthritis    hips  . Breast cancer ([place]) 06/25/2017   Right breast  . Cancer ([place])    right breast DCIS  . Complication of anesthesia   . Family history of breast cancer   . Family history of colon cancer   . Family history of malignant neoplasm of breast   . Glaucoma   . H/O seasonal allergies   . Hypertension   . IBS (irritable bowel syndrome)   . Personal history of radiation therapy   . PONV (postoperative nausea and vomiting)     Past Surgical History:  Procedure Laterality Date  . AXILLARY LYMPH NODE DISSECTION Right 07/29/2017   Procedure: AXILLARY LYMPH NODE DISSECTION;  Surgeon: Donnie Mesa, MD;  Location: [place];  Service: General;  Laterality: Right;  . AXILLARY SENTINEL NODE BIOPSY Right 07/29/2017   Procedure: RIGHT AXILLARY SENTINEL NODE BIOPSY;  Surgeon: Donnie Mesa, MD;  Location: [place];  Service: General;  Laterality: Right;  . BREAST BIOPSY     Multiple Biopsies, both breasts  . BREAST LUMPECTOMY Right 2018  . BREAST LUMPECTOMY WITH RADIOACTIVE SEED LOCALIZATION Right 07/15/2017   Procedure: RIGHT BREAST LUMPECTOMY WITH RADIOACTIVE SEED LOCALIZATION ERAS PATHWAY;  Surgeon: Donnie Mesa, MD;   Location: [place];  Service: General;  Laterality: Right;  LMA  . CESAREAN SECTION     x4    There were no vitals filed for this visit.  Subjective Assessment - 06/25/19 1104    Subjective  Pt reports that she is feeling good. She states that she felt really good after her last session. Her pain is about a 4/5 out of 10 today and she is still getting increased pain with increased activity.    Pertinent History  Rt lumpectomy due to grade 2 IDC with DCIS on 07/15/17 ER/PR positive, HER2 negative with 0/7 lymph nodes negative. Radiation completed.  Currently on letrozole. Other history includes: obesity, HTN,    Patient Stated Goals  decrease the Rt arm pain    Currently in Pain?  Yes    Pain Score  5     Pain Location  Arm    Pain Orientation  Right;Upper    Pain Type  Chronic pain;Surgical pain    Pain Onset  More than a month ago    Pain Frequency  Intermittent    Aggravating Factors   reaching, driving    Effect of Pain on Daily Activities  I can't hold much or lift things         [facility] PT Assessment - 06/25/19 0001      ROM / Strength  AROM / PROM / Strength  AROM      AROM   AROM Assessment Site  Shoulder    Right/Left Shoulder  Right    Right Shoulder Flexion  119 Degrees    Right Shoulder ABduction  73 Degrees    Right Shoulder Internal Rotation  0 Degrees   60 degrees abd    Right Shoulder External Rotation  90 Degrees   60 degrees abd             Outpatient [facility]  Lymphedema Life Impact Scale Total Score  52.94 %           [facility] Adult PT Treatment/Exercise - 06/25/19 0001      Exercises   Exercises  Shoulder      Shoulder Exercises: Supine   Other Supine Exercises  Dowel chest press 10x VC for easy stretching and full elbow extension    Other Supine Exercises  Dowel chest press w/shoulder flexion improved w/repetitions. VC for easy stretching and avoiding pain.        Shoulder Exercises: Standing   External Rotation  Strengthening;Right;5 reps;Theraband    Theraband Level (Shoulder External Rotation)  Level 1 (Yellow)    External Rotation Limitations  Occasional VC in order to prevent R trunk rotation.     Internal Rotation  Strengthening;Right;5 reps;Theraband    Theraband Level (Shoulder Internal Rotation)  Level 1 (Yellow)    Internal Rotation Limitations  Pt requires VC in order to prevent R shoulder elevation and keep elbow tucked in order to prevent compensation with shoulder abd    Flexion  Strengthening;Right;5 reps;Theraband    Theraband Level (Shoulder Flexion)  Level 1 (Yellow)    Flexion Limitations  VC to keep the elbow straight in order to prevent compensation w/biceps     Extension  Strengthening;Right;5 reps;Theraband    Theraband Level (Shoulder Extension)  Level 1 (Yellow)    Extension Limitations  VC to prevent elbow flexion to prevent compensation w/lattisimus dorsi.       Shoulder Exercises: Stretch   Other Shoulder Stretches  Supine pec stretch into ER with hands behind head and VC to avoid pain 5x for 5 seconds       Manual Therapy   Manual Therapy  Manual Lymphatic Drainage (MLD);Passive ROM    Manual Lymphatic Drainage (MLD)  In Supine: Short neck, superficial and deep abdominals, Rt inguinal and Lt axillary nodes, Rt axillo-inguinal and anterior inter-axillary anastomosis, then Rt UE working from lateral upper arm to dorsum of hand proximal to distal then retracing all steps. Continuing w/pt education    Passive ROM  PROM into flexion, abd, ER with oscillations for pain management, pt with improved tolerance though still requiring very slow motions so as not to tense up             PT Education - 06/25/19 1132    Education Details  Access Code: [email or web address], Continued with rockwood exercises and add stretching for improved ROM.    Person(s) Educated  Patient    Methods  Explanation;Demonstration;Tactile cues;Verbal  cues;Handout    Comprehension  Verbalized understanding;Returned demonstration          PT Long Term Goals - 05/27/19 1430      PT LONG TERM GOAL #1   Title  Pt will decrease pain with Rt arm movement to 3/10 or less    Baseline  10/10 on 9/3    Time  6    Period  Weeks    Status  New      PT LONG TERM GOAL #2   Title  Pt will be ind with home compression use and care    Time  6    Period  Weeks    Status  New      PT LONG TERM GOAL #3   Title  Pt will demonstrate Rt shoulder AROM WNL without pain more than 5/10    Time  6    Period  Weeks    Status  New      PT LONG TERM GOAL #4   Title  Pt will be able to put her bra on without difficulty    Time  6    Period  Weeks    Status  New            Plan - 06/25/19 1106    Clinical Impression Statement  Pt presents to physical therapy with continued pain/stiffness in her R shoulder. Pt AROM for the R shoulder was measured this session with significant limitations. She is able to reach greater motion during PROM with oscillations and is limited by pain and most likely postural abnormalities and scapular weakness. Pt continues to try to compensate w/shoulder exercises requiring VC to prevent compensation throughout. She had no pain with flexion this session during rockwood exercises but required VC to prevent elbow flexion compensation. AAROM stretching initiated for the shoulder to improved ROM. MLD was performed with decreased reports of pain following that increased again following AROM measurements.    Personal Factors and Comorbidities  Age;Fitness;Comorbidity 2    Comorbidities  radiation and lymph node removal    Examination-Activity Limitations  Lift;Carry;Dressing;Hygiene/Grooming    Examination-Participation Restrictions  Yard Work;Cleaning;Meal Prep;Community Activity;Laundry    PT Frequency  2x / week    PT Duration  6 weeks    PT Treatment/Interventions  ADLs/Self Care Home Management;Therapeutic  exercise;Patient/family education;Manual lymph drainage;Manual techniques;Compression bandaging;Passive range of motion;Taping    PT Next Visit Plan  Continue with AAROM activities, MLD and easy postural/scapular stablization ther-ex. Pt will most likely need to set up more appointments.    PT Home Exercise Plan  Rockwood and AAROM dowel exercises    Consulted and Agree with Plan of Care  Patient       Patient will benefit from skilled therapeutic intervention in order to improve the following deficits and impairments:  Decreased knowledge of use of DME, Pain, Postural dysfunction, Decreased activity tolerance, Impaired UE functional use, Increased edema  Visit Diagnosis: Lymphedema  Pain in right arm  Abnormal posture     Problem List Patient Active Problem List   Diagnosis Date Noted  . Genetic testing 09/30/2017  . Family history of colon cancer   . Family history of breast cancer   . Malignant neoplasm of upper-outer quadrant of right breast in female, estrogen receptor positive ([place]) 08/12/2017  . Family history of malignant neoplasm of breast   . HYPERLIPIDEMIA 10/21/2008  . OBESITY 10/21/2008  . HYPERTENSION 10/21/2008  . ALLERGIC RHINITIS 10/21/2008  . DIVERTICULOSIS, COLON 10/21/2008  . FEVER UNSPECIFIED 10/14/2008  . OTHER ABNORMAL BLOOD CHEMISTRY 10/14/2008    Ander Purpura, PT 06/25/2019, 12:11 PM  [place], [place], [address] Phone: [phone]   Fax:  [phone]  Name: Cheyenne Lee MRN: [id number] Date of Birth: 1941/05/10

## 2019-06-28 ENCOUNTER — Ambulatory Visit: Payer: Medicare Other

## 2019-06-28 ENCOUNTER — Other Ambulatory Visit: Payer: Self-pay

## 2019-06-28 DIAGNOSIS — R293 Abnormal posture: Secondary | ICD-10-CM | POA: Diagnosis not present

## 2019-06-28 DIAGNOSIS — M79601 Pain in right arm: Secondary | ICD-10-CM

## 2019-06-28 DIAGNOSIS — I890 Lymphedema, not elsewhere classified: Secondary | ICD-10-CM

## 2019-06-28 NOTE — Therapy (Signed)
[place], [place], [address] Phone: [phone]   Fax:  [phone]  Physical Therapy Treatment  Patient Details  Name: Cheyenne Lee MRN: [id number] Date of Birth: 09-12-1941 Referring Provider (PT): Dr. Lindi Adie   Encounter Date: 06/28/2019  PT End of Session - 06/28/19 1053    Visit Number  7    Number of Visits  9    Date for PT Re-Evaluation  07/08/19    PT Start Time  7672    PT Stop Time  1052    PT Time Calculation (min)  50 min    Activity Tolerance  Patient tolerated treatment well    Behavior During Therapy  [facility] for tasks assessed/performed       Past Medical History:  Diagnosis Date  . Arthritis    hips  . Breast cancer ([place]) 06/25/2017   Right breast  . Cancer ([place])    right breast DCIS  . Complication of anesthesia   . Family history of breast cancer   . Family history of colon cancer   . Family history of malignant neoplasm of breast   . Glaucoma   . H/O seasonal allergies   . Hypertension   . IBS (irritable bowel syndrome)   . Personal history of radiation therapy   . PONV (postoperative nausea and vomiting)     Past Surgical History:  Procedure Laterality Date  . AXILLARY LYMPH NODE DISSECTION Right 07/29/2017   Procedure: AXILLARY LYMPH NODE DISSECTION;  Surgeon: Donnie Mesa, MD;  Location: [place];  Service: General;  Laterality: Right;  . AXILLARY SENTINEL NODE BIOPSY Right 07/29/2017   Procedure: RIGHT AXILLARY SENTINEL NODE BIOPSY;  Surgeon: Donnie Mesa, MD;  Location: [place];  Service: General;  Laterality: Right;  . BREAST BIOPSY     Multiple Biopsies, both breasts  . BREAST LUMPECTOMY Right 2018  . BREAST LUMPECTOMY WITH RADIOACTIVE SEED LOCALIZATION Right 07/15/2017   Procedure: RIGHT BREAST LUMPECTOMY WITH RADIOACTIVE SEED LOCALIZATION ERAS PATHWAY;  Surgeon: Donnie Mesa, MD;  Location: [place];   Service: General;  Laterality: Right;  LMA  . CESAREAN SECTION     x4    There were no vitals filed for this visit.  Subjective Assessment - 06/28/19 1008    Subjective  I can tell my Rt arm has gotten better some but it's not as good as it could be. I would really like to continue coming to therapy because it's helped me some and I think I can get better. I also don't want to lose the progress I've made so far. I'm wearing my sleeve more frequently and my armpit area has gotten used to it now, it's not irritated anymore. I've been doing the supine exs 2x/day and I can tell I am moving further.    Pertinent History  Rt lumpectomy due to grade 2 IDC with DCIS on 07/15/17 ER/PR positive, HER2 negative with 0/7 lymph nodes negative. Radiation completed.  Currently on letrozole. Other history includes: obesity, HTN,    Patient Stated Goals  decrease the Rt arm pain    Currently in Pain?  No/denies                  Outpatient [facility] from [facility]  Lymphedema Life Impact Scale Total Score  52.94 %           [facility] Adult PT Treatment/Exercise - 06/28/19 0001  Shoulder Exercises: Seated   Retraction  Strengthening;Both;10 reps;Theraband    Theraband Level (Shoulder Retraction)  Level 1 (Yellow)    Retraction Limitations  VCs to focus on retraction at end of ROM      Shoulder Exercises: Pulleys   Scaption  2 minutes    Scaption Limitations  VCs to relax Rt shoulder during but pt was improved with this today and AA/ROM was improved as well    ABduction  2 minutes    ABduction Limitations  Again with VCs to relax shoulder but pt also improved with this      Shoulder Exercises: Therapy Ball   Flexion  Both;Other (comment)   6 reps with pt returning therapist demo   Flexion Limitations  VCs to keep Rt shoulder relaxed at end ROM      Shoulder Exercises: Stretch   Wall Stretch - ABduction  5 reps   Rt UE using finger ladder,  first #20, then up to #22 at end   Wall Stretch - ABduction Limitations  Had pt placed her Lt hand on Rt shoulder for tactile cuing to decrease Rt scapular compensation      Manual Therapy   Manual Therapy  Manual Lymphatic Drainage (MLD);Passive ROM    Manual Lymphatic Drainage (MLD)  In Supine: Short neck, superficial and deep abdominals, Rt inguinal and Lt axillary nodes, Rt axillo-inguinal and anterior inter-axillary anastomosis, then Rt UE working from lateral upper arm to dorsum of hand proximal to distal then retracing all steps.    Passive ROM  PROM into flexion, abd, ER with oscillations for pain management, pt with improved tolerance though still requiring very slow motions so as not to tense up, though her end ROM flexion was well improved today and pt reports noticing this as well                  PT Long Term Goals - 05/27/19 1430      PT LONG TERM GOAL #1   Title  Pt will decrease pain with Rt arm movement to 3/10 or less    Baseline  10/10 on 9/3    Time  6    Period  Weeks    Status  New      PT LONG TERM GOAL #2   Title  Pt will be ind with home compression use and care    Time  6    Period  Weeks    Status  New      PT LONG TERM GOAL #3   Title  Pt will demonstrate Rt shoulder AROM WNL without pain more than 5/10    Time  6    Period  Weeks    Status  New      PT LONG TERM GOAL #4   Title  Pt will be able to put her bra on without difficulty    Time  6    Period  Weeks    Status  New            Plan - 06/28/19 0954    Clinical Impression Statement  Progressed AA/ROM exercises today. Pt did tolerate these well reporting good stretches at end ROM's but did require multiple tactile and VCs throughout to decrease Rt scapular compensation which she was able to do with cuing. With pulleys she was compensating less than when this therapist saw her last and pt repors noticing she was able to raise arm higher than before. Also continued with manual    therapy and her end P/ROM is improvng though still requires some VCs to relax and slow movements with oscillations for relaxation. She reports was taking Tylenol daily a few times a day for pain and now only needs it intermittently during the week and if she does it's 1x/day. Her intenstiy of pain when she feels it is also improved and has periods of time now with no pain, where as was hurting 24/7.    Personal Factors and Comorbidities  Age;Fitness;Comorbidity 2    Comorbidities  radiation and lymph node removal    Examination-Activity Limitations  Lift;Carry;Dressing;Hygiene/Grooming    Stability/Clinical Decision Making  Evolving/Moderate complexity    Rehab Potential  Good    PT Frequency  2x / week    PT Duration  6 weeks    PT Treatment/Interventions  ADLs/Self Care Home Management;Therapeutic exercise;Patient/family education;Manual lymph drainage;Manual techniques;Compression bandaging;Passive range of motion;Taping    PT Next Visit Plan  Continue with AAROM activities, MLD and easy postural/scapular stablization ther-ex.    PT Home Exercise Plan  Rockwood and AAROM dowel exercises    Consulted and Agree with Plan of Care  Patient       Patient will benefit from skilled therapeutic intervention in order to improve the following deficits and impairments:  Decreased knowledge of use of DME, Pain, Postural dysfunction, Decreased activity tolerance, Impaired UE functional use, Increased edema  Visit Diagnosis: Lymphedema  Pain in right arm  Abnormal posture     Problem List Patient Active Problem List   Diagnosis Date Noted  . Genetic testing 09/30/2017  . Family history of colon cancer   . Family history of breast cancer   . Malignant neoplasm of upper-outer quadrant of right breast in female, estrogen receptor positive ([place]) 08/12/2017  . Family history of malignant neoplasm of breast   . HYPERLIPIDEMIA 10/21/2008  . OBESITY 10/21/2008  . HYPERTENSION 10/21/2008  .  ALLERGIC RHINITIS 10/21/2008  . DIVERTICULOSIS, COLON 10/21/2008  . FEVER UNSPECIFIED 10/14/2008  . OTHER ABNORMAL BLOOD CHEMISTRY 10/14/2008    Otelia Limes, PTA 06/28/2019, 10:59 AM  [place], [place], [address] Phone: [phone]   Fax:  [phone]  Name: Cheyenne Lee MRN: [id number] Date of Birth: 08-23-1941

## 2019-06-30 ENCOUNTER — Ambulatory Visit: Payer: Medicare Other

## 2019-06-30 ENCOUNTER — Other Ambulatory Visit: Payer: Self-pay

## 2019-06-30 DIAGNOSIS — R293 Abnormal posture: Secondary | ICD-10-CM

## 2019-06-30 DIAGNOSIS — I890 Lymphedema, not elsewhere classified: Secondary | ICD-10-CM

## 2019-06-30 DIAGNOSIS — M79601 Pain in right arm: Secondary | ICD-10-CM

## 2019-06-30 NOTE — Patient Instructions (Signed)
Over Head Pull: Narrow and Wide Grip   Cancer Rehab [phone]   On back, knees bent, feet flat, band across thighs, elbows straight but relaxed. Pull hands apart (start). Keeping elbows straight, bring arms up and over head, hands toward floor. Keep pull steady on band. Hold momentarily. Return slowly, keeping pull steady, back to start. Then do same with a wider grip on the band (past shoulder width) Repeat _5-10__ times. Band color __yellow or red____   Side Pull: Double Arm   On back, knees bent, feet flat. Arms perpendicular to body, shoulder level, elbows straight but relaxed. Pull arms out to sides, elbows straight. Resistance band comes across collarbones, hands toward floor. Hold momentarily. Slowly return to starting position. Repeat _5-10__ times. Band color _yellow or red____   Sword   On back, knees bent, feet flat, left hand on left hip, right hand above left. Pull right arm DIAGONALLY (hip to shoulder) across chest. Bring right arm along head toward floor. Hold momentarily. Slowly return to starting position. FOR NOW NO RESISTANCE WITH RT ARM UNTIL NO PAIN WITH MOTION. Repeat _5-10__ times. Do with left arm. Band color _yellow or red_____   Shoulder Rotation: Double Arm   On back, knees bent, feet flat, elbows tucked at sides, bent 90, hands palms up. Pull hands apart and down toward floor, keeping elbows near sides. Hold momentarily. Slowly return to starting position. Repeat _5-10__ times. Band color __yellow or red____

## 2019-06-30 NOTE — Therapy (Signed)
[place], [place], [address] Phone: [phone]   Fax:  [phone]  Physical Therapy Treatment  Patient Details  Name: Cheyenne Lee MRN: [id number] Date of Birth: 09/13/1941 Referring Provider (PT): Dr. Lindi Adie   Encounter Date: 06/30/2019  PT End of Session - 06/30/19 1055    Visit Number  8    Number of Visits  9    Date for PT Re-Evaluation  07/08/19    PT Start Time  1001    PT Stop Time  1052    PT Time Calculation (min)  51 min    Activity Tolerance  Patient tolerated treatment well    Behavior During Therapy  [facility] for tasks assessed/performed       Past Medical History:  Diagnosis Date  . Arthritis    hips  . Breast cancer ([place]) 06/25/2017   Right breast  . Cancer ([place])    right breast DCIS  . Complication of anesthesia   . Family history of breast cancer   . Family history of colon cancer   . Family history of malignant neoplasm of breast   . Glaucoma   . H/O seasonal allergies   . Hypertension   . IBS (irritable bowel syndrome)   . Personal history of radiation therapy   . PONV (postoperative nausea and vomiting)     Past Surgical History:  Procedure Laterality Date  . AXILLARY LYMPH NODE DISSECTION Right 07/29/2017   Procedure: AXILLARY LYMPH NODE DISSECTION;  Surgeon: Donnie Mesa, MD;  Location: [place];  Service: General;  Laterality: Right;  . AXILLARY SENTINEL NODE BIOPSY Right 07/29/2017   Procedure: RIGHT AXILLARY SENTINEL NODE BIOPSY;  Surgeon: Donnie Mesa, MD;  Location: [place];  Service: General;  Laterality: Right;  . BREAST BIOPSY     Multiple Biopsies, both breasts  . BREAST LUMPECTOMY Right 2018  . BREAST LUMPECTOMY WITH RADIOACTIVE SEED LOCALIZATION Right 07/15/2017   Procedure: RIGHT BREAST LUMPECTOMY WITH RADIOACTIVE SEED LOCALIZATION ERAS PATHWAY;  Surgeon: Donnie Mesa, MD;  Location: [place];   Service: General;  Laterality: Right;  LMA  . CESAREAN SECTION     x4    There were no vitals filed for this visit.  Subjective Assessment - 06/30/19 1003    Subjective  I changed my sheets this morning on my bed and my Rt shoulder felt good when I did that.    Pertinent History  Rt lumpectomy due to grade 2 IDC with DCIS on 07/15/17 ER/PR positive, HER2 negative with 0/7 lymph nodes negative. Radiation completed.  Currently on letrozole. Other history includes: obesity, HTN,    Patient Stated Goals  decrease the Rt arm pain    Currently in Pain?  No/denies         [facility] PT Assessment - 06/30/19 0001      AROM   Right Shoulder Flexion  126 Degrees    Right Shoulder ABduction  117 Degrees    Right Shoulder Internal Rotation  15 Degrees   able to hold 90 abd now   Right Shoulder External Rotation  90 Degrees   able to hold 90 abd now             Outpatient [facility]  Lymphedema Life Impact Scale Total Score  52.94 %           [facility] Adult PT Treatment/Exercise - 06/30/19 0001  Shoulder Exercises: Supine   Horizontal ABduction  Strengthening;Both;5 reps;Theraband    Theraband Level (Shoulder Horizontal ABduction)  Level 2 (Red)    External Rotation  Strengthening;Both;5 reps;Theraband    Theraband Level (Shoulder External Rotation)  Level 2 (Red)    Flexion  Strengthening;Both;5 reps;Theraband   Narrow and Wide Grip, 5 times each   Theraband Level (Shoulder Flexion)  Level 2 (Red)    Flexion Limitations  Pt returned demo for all supine scapular series and VCs during for correct UE positions    Diagonals  Strengthening;Right;Left;5 reps;Theraband    Theraband Level (Shoulder Diagonals)  Level 2 (Red)    Diagonals Limitations  Ended up using no resistance and modified ROM to avoid pain with Rt UE      Shoulder Exercises: Seated   External Rotation  Strengthening;Both;10 reps;Theraband    Theraband  Level (Shoulder External Rotation)  Level 2 (Red)    External Rotation Limitations  Demo and VCs initally to keep elbows at sides then pt able to return correct demo      Shoulder Exercises: Pulleys   Flexion  2 minutes    Flexion Limitations  Pt with good technique today    ABduction  2 minutes    ABduction Limitations  Continued with VCs to relax shoulder but pt also improved with this      Shoulder Exercises: Therapy Ball   Flexion  Both;10 reps      Shoulder Exercises: Stretch   Wall Stretch - ABduction  5 reps   up to #25     Manual Therapy   Manual Therapy  Manual Lymphatic Drainage (MLD);Passive ROM    Manual Lymphatic Drainage (MLD)  In Supine: Short neck, superficial and deep abdominals, Rt inguinal and Lt axillary nodes, Rt axillo-inguinal and anterior inter-axillary anastomosis, then Rt UE working from lateral upper arm to dorsum of hand proximal to distal then retracing all steps.    Passive ROM  In Supine to Rt shoulder into flexion, abduction, er and IR to pts tolerance; still very slowly to prevent muscles from guarding, but pt with much improvement and pt with much iumproved end P/ROM except very limited with IR             PT Education - 06/30/19 1034    Education Details  Supine scapular series    Person(s) Educated  Patient    Methods  Explanation;Demonstration;Handout    Comprehension  Verbalized understanding;Returned demonstration;Tactile cues required;Need further instruction          PT Long Term Goals - 05/27/19 1430      PT LONG TERM GOAL #1   Title  Pt will decrease pain with Rt arm movement to 3/10 or less    Baseline  10/10 on 9/3    Time  6    Period  Weeks    Status  New      PT LONG TERM GOAL #2   Title  Pt will be ind with home compression use and care    Time  6    Period  Weeks    Status  New      PT LONG TERM GOAL #3   Title  Pt will demonstrate Rt shoulder AROM WNL without pain more than 5/10    Time  6    Period  Weeks     Status  New      PT LONG TERM GOAL #4   Title  Pt will be able to put her  bra on without difficulty    Time  6    Period  Weeks    Status  New            Plan - 06/30/19 1056    Clinical Impression Statement  Continued with AA/ROM and progressed HEP to inlcude scapular stability. Used red theraband so as pt can have this to progress to at home with [place], but instructed her to use her yellow for supine scap and she verbalized understanding. Her A/ and P/ROM has improved greatly and pt reports noticing great improvements thus far. Her A/ROM were improved, though she is still very limited with functional end ROM and will benefit from continuing towards progressing this further.    Personal Factors and Comorbidities  Age;Fitness;Comorbidity 2    Comorbidities  radiation and lymph node removal    Examination-Activity Limitations  Lift;Carry;Dressing;Hygiene/Grooming    Examination-Participation Restrictions  Yard Work;Cleaning;Meal Prep;Community Activity;Laundry    Stability/Clinical Decision Making  Evolving/Moderate complexity    Rehab Potential  Good    PT Frequency  2x / week    PT Duration  6 weeks    PT Treatment/Interventions  ADLs/Self Care Home Management;Therapeutic exercise;Patient/family education;Manual lymph drainage;Manual techniques;Compression bandaging;Passive range of motion;Taping    PT Next Visit Plan  Renewal needed next week. Continue with AAROM activities, MLD and review HEP; cont manual therapy promoting improved end ROM    PT Home Exercise Plan  Rockwood and AAROM dowel exercises; Rockwood (with red band) and supine scapular series (with yellow band)    Consulted and Agree with Plan of Care  Patient       Patient will benefit from skilled therapeutic intervention in order to improve the following deficits and impairments:  Decreased knowledge of use of DME, Pain, Postural dysfunction, Decreased activity tolerance, Impaired UE functional use, Increased  edema  Visit Diagnosis: Lymphedema  Pain in right arm  Abnormal posture     Problem List Patient Active Problem List   Diagnosis Date Noted  . Genetic testing 09/30/2017  . Family history of colon cancer   . Family history of breast cancer   . Malignant neoplasm of upper-outer quadrant of right breast in female, estrogen receptor positive ([place]) 08/12/2017  . Family history of malignant neoplasm of breast   . HYPERLIPIDEMIA 10/21/2008  . OBESITY 10/21/2008  . HYPERTENSION 10/21/2008  . ALLERGIC RHINITIS 10/21/2008  . DIVERTICULOSIS, COLON 10/21/2008  . FEVER UNSPECIFIED 10/14/2008  . OTHER ABNORMAL BLOOD CHEMISTRY 10/14/2008    Otelia Limes, PTA 06/30/2019, 11:00 AM  [place], [place], [address] Phone: [phone]   Fax:  [phone]  Name: Cheyenne Lee MRN: [id number] Date of Birth: 1940/11/29

## 2019-07-06 ENCOUNTER — Other Ambulatory Visit: Payer: Self-pay

## 2019-07-06 ENCOUNTER — Ambulatory Visit: Payer: Medicare Other

## 2019-07-06 DIAGNOSIS — I890 Lymphedema, not elsewhere classified: Secondary | ICD-10-CM

## 2019-07-06 DIAGNOSIS — R293 Abnormal posture: Secondary | ICD-10-CM

## 2019-07-06 DIAGNOSIS — M79601 Pain in right arm: Secondary | ICD-10-CM

## 2019-07-06 NOTE — Therapy (Signed)
[place], [place], [address] Phone: [phone]   Fax:  [phone]  Physical Therapy Treatment  Patient Details  Name: Cheyenne Lee MRN: [id number] Date of Birth: [birth year] Referring Provider (PT): Dr. Lindi Adie   Encounter Date: 07/06/2019  PT End of Session - 07/06/19 1155    Visit Number  9    Number of Visits  9    Date for PT Re-Evaluation  07/08/19    PT Start Time  1101    PT Stop Time  1155    PT Time Calculation (min)  54 min    Activity Tolerance  Patient tolerated treatment well    Behavior During Therapy  [facility] for tasks assessed/performed       Past Medical History:  Diagnosis Date  . Arthritis    hips  . Breast cancer ([place]) 06/25/2017   Right breast  . Cancer ([place])    right breast DCIS  . Complication of anesthesia   . Family history of breast cancer   . Family history of colon cancer   . Family history of malignant neoplasm of breast   . Glaucoma   . H/O seasonal allergies   . Hypertension   . IBS (irritable bowel syndrome)   . Personal history of radiation therapy   . PONV (postoperative nausea and vomiting)     Past Surgical History:  Procedure Laterality Date  . AXILLARY LYMPH NODE DISSECTION Right 07/29/2017   Procedure: AXILLARY LYMPH NODE DISSECTION;  Surgeon: Donnie Mesa, MD;  Location: [place];  Service: General;  Laterality: Right;  . AXILLARY SENTINEL NODE BIOPSY Right 07/29/2017   Procedure: RIGHT AXILLARY SENTINEL NODE BIOPSY;  Surgeon: Donnie Mesa, MD;  Location: [place];  Service: General;  Laterality: Right;  . BREAST BIOPSY     Multiple Biopsies, both breasts  . BREAST LUMPECTOMY Right 2018  . BREAST LUMPECTOMY WITH RADIOACTIVE SEED LOCALIZATION Right 07/15/2017   Procedure: RIGHT BREAST LUMPECTOMY WITH RADIOACTIVE SEED LOCALIZATION ERAS PATHWAY;  Surgeon: Donnie Mesa, MD;  Location: [place];   Service: General;  Laterality: Right;  LMA  . CESAREAN SECTION     x4    There were no vitals filed for this visit.  Subjective Assessment - 07/06/19 1113    Subjective  This past week I feel like I've made big gains with my motion and pain. I feel at least 70% better!    Pertinent History  Rt lumpectomy due to grade 2 IDC with DCIS on 07/15/17 ER/PR positive, HER2 negative with 0/7 lymph nodes negative. Radiation completed.  Currently on letrozole. Other history includes: obesity, HTN,    Patient Stated Goals  decrease the Rt arm pain    Currently in Pain?  No/denies                  Outpatient Re[facility] from [facility]/27/[facility]  Lymphedema Life Impact Scale Total Score  52.94 %           [facility] Adult PT Treatment/Exercise - 07/06/19 0001      Manual Therapy   Manual Therapy  Manual Lymphatic Drainage (MLD);Passive ROM    Manual Lymphatic Drainage (MLD)  In Supine: Short neck, superficial and deep abdominals, Rt inguinal and Lt axillary nodes, Rt axillo-inguinal and anterior inter-axillary anastomosis, then Rt UE working from lateral upper arm to dorsum of hand proximal to distal then retracing all steps.    Passive ROM  In Supine to Rt shoulder into flexion, abduction, er and IR to pts tolerance; still very slowly to prevent muscles from guarding, but pt with much improvement and pt with much iumproved end P/ROM except very limited with IR                  PT Long Term Goals - 05/27/19 1430      PT LONG TERM GOAL #1   Title  Pt will decrease pain with Rt arm movement to 3/10 or less    Baseline  10/10 on 9/3    Time  6    Period  Weeks    Status  New      PT LONG TERM GOAL #2   Title  Pt will be ind with home compression use and care    Time  6    Period  Weeks    Status  New      PT LONG TERM GOAL #3   Title  Pt will demonstrate Rt shoulder AROM WNL without pain more than 5/10    Time  6    Period  Weeks     Status  New      PT LONG TERM GOAL #4   Title  Pt will be able to put her bra on without difficulty    Time  6    Period  Weeks    Status  New            Plan - 07/06/19 1155    Clinical Impression Statement  PT is making excellent progress towards her goals reporting she has regained alot of use with her Rt arm over past week or so and pain is near gone. She uses her compression sleeve intermittently prn for swelling. She has one more visit this week and would like to come back for a final assess 2 weeks after that. Cancelled remaining visits per pt request except for 10/15 and 10/29.    Personal Factors and Comorbidities  Age;Fitness;Comorbidity 2    Comorbidities  radiation and lymph node removal    Examination-Activity Limitations  Lift;Carry;Dressing;Hygiene/Grooming    Examination-Participation Restrictions  Yard Work;Cleaning;Meal Prep;Community Activity;Laundry    Stability/Clinical Decision Making  Evolving/Moderate complexity    Rehab Potential  Good    PT Frequency  2x / week    PT Duration  6 weeks    PT Treatment/Interventions  ADLs/Self Care Home Management;Therapeutic exercise;Patient/family education;Manual lymph drainage;Manual techniques;Compression bandaging;Passive range of motion;Taping    PT Next Visit Plan  Focus on end ROM stretching of Rt UE and MLD.    PT Home Exercise Plan  Rockwood and AAROM dowel exercises; Rockwood (with red band) and supine scapular series (with yellow band)    Consulted and Agree with Plan of Care  Patient       Patient will benefit from skilled therapeutic intervention in order to improve the following deficits and impairments:  Decreased knowledge of use of DME, Pain, Postural dysfunction, Decreased activity tolerance, Impaired UE functional use, Increased edema  Visit Diagnosis: Lymphedema  Pain in right arm  Abnormal posture     Problem List Patient Active Problem List   Diagnosis Date Noted  . Genetic testing  09/30/2017  . Family history of colon cancer   . Family history of breast cancer   . Malignant neoplasm of upper-outer quadrant of right breast in female, estrogen receptor positive ([place]) 08/12/2017  . Family history of malignant neoplasm of breast   . HYPERLIPIDEMIA  10/21/2008  . OBESITY 10/21/2008  . HYPERTENSION 10/21/2008  . ALLERGIC RHINITIS 10/21/2008  . DIVERTICULOSIS, COLON 10/21/2008  . FEVER UNSPECIFIED 10/14/2008  . OTHER ABNORMAL BLOOD CHEMISTRY 10/14/2008    Otelia Limes, PTA 07/06/2019, 12:01 PM  [place], [place], [address] Phone: [phone]   Fax:  [phone]  Name: Cheyenne Lee MRN: [id number] Date of Birth: 03/24/1941

## 2019-07-08 ENCOUNTER — Ambulatory Visit: Payer: Medicare Other

## 2019-07-08 ENCOUNTER — Other Ambulatory Visit: Payer: Self-pay

## 2019-07-08 DIAGNOSIS — R293 Abnormal posture: Secondary | ICD-10-CM

## 2019-07-08 DIAGNOSIS — M79601 Pain in right arm: Secondary | ICD-10-CM

## 2019-07-08 DIAGNOSIS — I890 Lymphedema, not elsewhere classified: Secondary | ICD-10-CM

## 2019-07-08 NOTE — Therapy (Signed)
[place], [place], [address] Phone: [phone]   Fax:  [phone]  Physical Therapy Treatment  Patient Details  Name: Cheyenne Lee MRN: [id number] Date of Birth: 06/21/1941 Referring Provider (PT): Dr. Lindi Adie   Encounter Date: 07/08/2019  PT End of Session - 07/08/19 1159    Visit Number  10    Number of Visits  13    Date for PT Re-Evaluation  08/05/19    Authorization Type  medicare/BCBS    PT Start Time  1102    PT Stop Time  1158    PT Time Calculation (min)  56 min    Activity Tolerance  Patient tolerated treatment well    Behavior During Therapy  [facility] for tasks assessed/performed       Past Medical History:  Diagnosis Date  . Arthritis    hips  . Breast cancer ([place]) 06/25/2017   Right breast  . Cancer ([place])    right breast DCIS  . Complication of anesthesia   . Family history of breast cancer   . Family history of colon cancer   . Family history of malignant neoplasm of breast   . Glaucoma   . H/O seasonal allergies   . Hypertension   . IBS (irritable bowel syndrome)   . Personal history of radiation therapy   . PONV (postoperative nausea and vomiting)     Past Surgical History:  Procedure Laterality Date  . AXILLARY LYMPH NODE DISSECTION Right 07/29/2017   Procedure: AXILLARY LYMPH NODE DISSECTION;  Surgeon: Donnie Mesa, MD;  Location: [place];  Service: General;  Laterality: Right;  . AXILLARY SENTINEL NODE BIOPSY Right 07/29/2017   Procedure: RIGHT AXILLARY SENTINEL NODE BIOPSY;  Surgeon: Donnie Mesa, MD;  Location: [place];  Service: General;  Laterality: Right;  . BREAST BIOPSY     Multiple Biopsies, both breasts  . BREAST LUMPECTOMY Right 2018  . BREAST LUMPECTOMY WITH RADIOACTIVE SEED LOCALIZATION Right 07/15/2017   Procedure: RIGHT BREAST LUMPECTOMY WITH RADIOACTIVE SEED LOCALIZATION ERAS PATHWAY;  Surgeon: Donnie Mesa,  MD;  Location: [place];  Service: General;  Laterality: Right;  LMA  . CESAREAN SECTION     x4    There were no vitals filed for this visit.      [facility] PT Assessment - 07/08/19 0001      AROM   Right Shoulder Flexion  142 Degrees   just tight at the end of the ROM    Right Shoulder ABduction  127 Degrees   just tight at the end of ROM   Right Shoulder Internal Rotation  27 Degrees   pt reports more tightness than pain now at end ROM   Right Shoulder External Rotation  90 Degrees        LYMPHEDEMA/ONCOLOGY QUESTIONNAIRE - 07/08/19 1114      Left Upper Extremity Lymphedema   15 cm Proximal to Olecranon Process  33.6 cm    10 cm Proximal to Olecranon Process  31.9 cm    Olecranon Process  26.2 cm    15 cm Proximal to Ulnar Styloid Process  24.3 cm    10 cm Proximal to Ulnar Styloid Process  22.6 cm    Just Proximal to Ulnar Styloid Process  17.2 cm    Across Hand at [employer]  18.7 cm    At [place] of 2nd Digit  6.5 cm  Outpatient Rehab from [facility]27/[facility]  Lymphedema Life Impact Scale Total Score  52.94 %           [facility] Adult PT Treatment/Exercise - 07/08/19 0001      Manual Therapy   Manual Therapy  Manual Lymphatic Drainage (MLD);Passive ROM    Manual Lymphatic Drainage (MLD)  In Supine: Short neck, superficial and deep abdominals, Rt inguinal and Lt axillary nodes, Rt axillo-inguinal and anterior inter-axillary anastomosis, then Rt UE working from lateral upper arm to dorsum of hand proximal to distal then retracing all steps.    Passive ROM  In Supine to Rt shoulder into flexion, abduction, er and IR to pts tolerance; pt with much improved end P/ROM except very limited with IR though this has improved some                  PT Long Term Goals - 07/08/19 1103      PT LONG TERM GOAL #1   Title  Pt will decrease pain with Rt arm movement to 3/10 or less    Baseline  10/10  on 9/3; 3/10 at the most, at times no pain - 07/08/19    Status  Achieved      PT LONG TERM GOAL #2   Title  Pt will be ind with home compression use and care    Baseline  Has a compression sleeve and wears prn - 07/08/19    Status  Achieved      PT LONG TERM GOAL #3   Title  Pt will demonstrate Rt shoulder AROM WNL without pain more than 5/10    Baseline  Rt shoulder flexion 142 and abduction 127 degrees, so ROM is improved but still limited, though is now painfree- 07/08/19    Status  Partially Met      PT LONG TERM GOAL #4   Title  Pt will be able to put her bra on without difficulty    Baseline  No problems with this - 07/08/19    Status  Achieved            Plan - 07/08/19 1200    Clinical Impression Statement  Pts A/ROM has improved well and she is progressing well towards D/C. She would like to return for at least one more visit in 2 weeks for another assess of progress and A/ROM. If she is continuing to do well may D/C then. Renewal done today.    Personal Factors and Comorbidities  Age;Fitness;Comorbidity 2    Comorbidities  radiation and lymph node removal    Examination-Activity Limitations  Lift;Carry;Dressing;Hygiene/Grooming    Examination-Participation Restrictions  Yard Work;Cleaning;Meal Prep;Community Activity;Laundry    Rehab Potential  Good    PT Frequency  2x / week    PT Duration  6 weeks    PT Treatment/Interventions  ADLs/Self Care Home Management;Therapeutic exercise;Patient/family education;Manual lymph drainage;Manual techniques;Compression bandaging;Passive range of motion;Taping    PT Next Visit Plan  Renewal done today. Pt to return in 2 weeks for reassess of A/ROM and independence with HEP.    PT Home Exercise Plan  Rockwood and AAROM dowel exercises; [place] (with red band) and supine scapular series (with yellow band)    Recommended Other Services  Issued script today for compression bra as pt reports she is unable to wear any of her bras since  surgery    Consulted and Agree with Plan of Care  Patient       Patient will  benefit from skilled therapeutic intervention in order to improve the following deficits and impairments:  Decreased knowledge of use of DME, Pain, Postural dysfunction, Decreased activity tolerance, Impaired UE functional use, Increased edema  Visit Diagnosis: Lymphedema  Pain in right arm  Abnormal posture     Problem List Patient Active Problem List   Diagnosis Date Noted  . Genetic testing 09/30/2017  . Family history of colon cancer   . Family history of breast cancer   . Malignant neoplasm of upper-outer quadrant of right breast in female, estrogen receptor positive ([place]) 08/12/2017  . Family history of malignant neoplasm of breast   . HYPERLIPIDEMIA 10/21/2008  . OBESITY 10/21/2008  . HYPERTENSION 10/21/2008  . ALLERGIC RHINITIS 10/21/2008  . DIVERTICULOSIS, COLON 10/21/2008  . FEVER UNSPECIFIED 10/14/2008  . OTHER ABNORMAL BLOOD CHEMISTRY 10/14/2008    Otelia Limes, PTA 07/08/2019, 12:06 PM  [place], [place], [address] Phone: [phone]   Fax:  [phone]  Name: Cheyenne Lee MRN: [id number] Date of Birth: January 17, 1941

## 2019-07-09 ENCOUNTER — Encounter

## 2019-07-13 ENCOUNTER — Ambulatory Visit: Payer: Medicare Other

## 2019-07-22 ENCOUNTER — Ambulatory Visit: Payer: Medicare Other

## 2019-07-22 ENCOUNTER — Other Ambulatory Visit: Payer: Self-pay

## 2019-07-22 DIAGNOSIS — M79601 Pain in right arm: Secondary | ICD-10-CM

## 2019-07-22 DIAGNOSIS — I890 Lymphedema, not elsewhere classified: Secondary | ICD-10-CM

## 2019-07-22 DIAGNOSIS — R293 Abnormal posture: Secondary | ICD-10-CM | POA: Diagnosis not present
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
## 2019-09-06 ENCOUNTER — Telehealth: Payer: Self-pay | Admitting: *Deleted

## 2019-09-06 ENCOUNTER — Encounter: Payer: Self-pay | Admitting: *Deleted

## 2019-09-06 NOTE — Telephone Encounter (Signed)
Received call from pt stating she is experiencing extreme joint pain.  Pt currently taking Letrozole 1/2 a tablet daily.  Pt states she would prefer to switch back to Anastrozole because she did not experience as much joint pain with that anti estrogen.  Per MD okay to switch back to Anastrozole.  RN informed pt to stop taking Letrozole x2 weeks to allow it to work its way out of her system.  She can then restart Anastrozole every other night x1 week to slowly built it back in her system and then can continue to taking it every night.  Pt verbalized understanding and states she has anastrozole tablets at home already and will call the office to update on how she is tolerating it.

## 2019-10-07 DIAGNOSIS — H401223 Low-tension glaucoma, left eye, severe stage: Secondary | ICD-10-CM | POA: Diagnosis not present

## 2019-10-07 DIAGNOSIS — H16223 Keratoconjunctivitis sicca, not specified as Sjogren's, bilateral: Secondary | ICD-10-CM | POA: Diagnosis not present

## 2019-10-07 DIAGNOSIS — H401211 Low-tension glaucoma, right eye, mild stage: Secondary | ICD-10-CM | POA: Diagnosis not present

## 2019-10-09 ENCOUNTER — Ambulatory Visit: Payer: Medicare Other | Attending: Internal Medicine

## 2019-10-09 DIAGNOSIS — Z23 Encounter for immunization: Secondary | ICD-10-CM | POA: Insufficient documentation

## 2019-10-09 NOTE — Progress Notes (Signed)
   Covid-19 Vaccination Clinic  Name:  Cheyenne Lee    MRN: [id number] DOB: October 26, 1940  10/09/2019  [name] was observed post Covid-19 immunization for 15 minutes without incidence. She was provided with Vaccine Information Sheet and instruction to access the V-Safe system.   [name] was instructed to call 911 with any severe reactions post vaccine: Marland Kitchen Difficulty breathing  . Swelling of your face and throat  . A fast heartbeat  . A bad rash all over your body  . Dizziness and weakness    Immunizations Administered    Name Date Dose VIS Date Route   Pfizer COVID-19 Vaccine 10/09/2019 12:16 PM 0.3 mL 09/03/2019 Intramuscular   Manufacturer: [place]   Lot: [id number]   [place]: [id number]

## 2019-10-30 ENCOUNTER — Ambulatory Visit: Payer: Medicare Other | Attending: Internal Medicine

## 2019-10-30 DIAGNOSIS — Z23 Encounter for immunization: Secondary | ICD-10-CM

## 2019-10-30 NOTE — Progress Notes (Signed)
   Covid-19 Vaccination Clinic  Name:  Cheyenne Lee    MRN: [id number] DOB: Nov 12, 1940  10/30/2019  [name] was observed post Covid-19 immunization for 15 minutes without incidence. She was provided with Vaccine Information Sheet and instruction to access the V-Safe system.   [name] was instructed to call 911 with any severe reactions post vaccine: Marland Kitchen Difficulty breathing  . Swelling of your face and throat  . A fast heartbeat  . A bad rash all over your body  . Dizziness and weakness    Immunizations Administered    Name Date Dose VIS Date Route   Pfizer COVID-19 Vaccine 10/30/2019 10:39 AM 0.3 mL 09/03/2019 Intramuscular   Manufacturer: [place]   Lot: [id number]   [place]: [id number]

## 2019-11-29 ENCOUNTER — Other Ambulatory Visit: Payer: Self-pay

## 2019-11-29 MED ORDER — ANASTROZOLE 1 MG PO TABS: 1.0000 mg | ORAL_TABLET | Freq: Every day | ORAL | 3 refills | Status: DC

## 2019-12-01 DIAGNOSIS — D0511 Intraductal carcinoma in situ of right breast: Secondary | ICD-10-CM | POA: Diagnosis not present

## 2019-12-01 DIAGNOSIS — C50911 Malignant neoplasm of unspecified site of right female breast: Secondary | ICD-10-CM | POA: Diagnosis not present

## 2019-12-20 DIAGNOSIS — E7800 Pure hypercholesterolemia, unspecified: Secondary | ICD-10-CM | POA: Diagnosis not present

## 2019-12-20 DIAGNOSIS — Z1211 Encounter for screening for malignant neoplasm of colon: Secondary | ICD-10-CM | POA: Diagnosis not present

## 2019-12-20 DIAGNOSIS — C50911 Malignant neoplasm of unspecified site of right female breast: Secondary | ICD-10-CM | POA: Diagnosis not present

## 2019-12-20 DIAGNOSIS — R7309 Other abnormal glucose: Secondary | ICD-10-CM | POA: Diagnosis not present

## 2019-12-20 DIAGNOSIS — J302 Other seasonal allergic rhinitis: Secondary | ICD-10-CM | POA: Diagnosis not present

## 2019-12-20 DIAGNOSIS — Z0001 Encounter for general adult medical examination with abnormal findings: Secondary | ICD-10-CM | POA: Diagnosis not present

## 2019-12-20 DIAGNOSIS — Z79899 Other long term (current) drug therapy: Secondary | ICD-10-CM | POA: Diagnosis not present

## 2019-12-20 DIAGNOSIS — I10 Essential (primary) hypertension: Secondary | ICD-10-CM | POA: Diagnosis not present

## 2019-12-20 DIAGNOSIS — M2550 Pain in unspecified joint: Secondary | ICD-10-CM | POA: Diagnosis not present

## 2019-12-21 DIAGNOSIS — Z1211 Encounter for screening for malignant neoplasm of colon: Secondary | ICD-10-CM | POA: Diagnosis not present

## 2020-02-07 DIAGNOSIS — J069 Acute upper respiratory infection, unspecified: Secondary | ICD-10-CM | POA: Diagnosis not present

## 2020-02-10 ENCOUNTER — Other Ambulatory Visit: Payer: Self-pay | Admitting: Family Medicine

## 2020-02-10 ENCOUNTER — Ambulatory Visit
Admission: RE | Admit: 2020-02-10 | Discharge: 2020-02-10 | Disposition: A | Discharge status: Home / Self Care | Payer: Medicare Other | Source: Ambulatory Visit | Attending: Family Medicine | Admitting: Family Medicine

## 2020-02-10 DIAGNOSIS — J209 Acute bronchitis, unspecified: Secondary | ICD-10-CM

## 2020-02-10 DIAGNOSIS — R05 Cough: Secondary | ICD-10-CM | POA: Diagnosis not present

## 2020-02-29 DIAGNOSIS — H401211 Low-tension glaucoma, right eye, mild stage: Secondary | ICD-10-CM | POA: Diagnosis not present

## 2020-02-29 DIAGNOSIS — H401223 Low-tension glaucoma, left eye, severe stage: Secondary | ICD-10-CM | POA: Diagnosis not present

## 2020-05-01 NOTE — Progress Notes (Signed)
Patient Care Team: [name], MD as PCP - General (Family Medicine) Nicholas Lose, MD as Consulting Physician (Hematology and Oncology) Kyung Rudd, MD as Consulting Physician (Radiation Oncology) Donnie Mesa, MD as Consulting Physician (General Surgery) Gardenia Phlegm, NP as Nurse Practitioner (Hematology and Oncology)  DIAGNOSIS:    ICD-10-CM   1. 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  The Multi-Gene Panel offered by [facility] includes sequencing and/or deletion duplication testing of the following 83 genes: ALK, APC, ATM, AXIN2,BAP1,  BARD1, BLM, BMPR1A, BRCA1, BRCA2, BRIP1, CASR, CDC73, CDH1, CDK4, CDKN1B, CDKN1C, CDKN2A (p14ARF), CDKN2A (p16INK4a), CEBPA, CHEK2, CTNNA1, DICER1, DIS3L2, EGFR (c.2369C>T, p.Thr790Met variant only), EPCAM (Deletion/duplication testing only), FH, FLCN, GATA2, GPC3, GREM1 (Promoter region deletion/duplication testing only), HOXB13 (c.251G>A, p.Gly84Glu), HRAS, KIT, MAX, MEN1, MET, MITF (c.952G>A, p.Glu318Lys variant only), MLH1, MSH2, MSH3, MSH6, MUTYH, NBN, NF1, NF2, NTHL1, PALB2, PDGFRA, PHOX2B, PMS2, POLD1, POLE, POT1, PRKAR1A, PTCH1, PTEN, RAD50, RAD51C, RAD51D, RB1, RECQL4, RET, RUNX1, SDHAF2, SDHA (sequence changes only), SDHB, SDHC, SDHD, SMAD4,  SMARCA4, SMARCB1, SMARCE1, STK11, SUFU, TERT, TERT, TMEM127, TP53, TSC1, TSC2, VHL, WRN and WT1.  The report date is September 30, 2017.     11/06/2017 -  Anti-estrogen oral therapy   Anastrozole 1 mg daily      CHIEF COMPLIANT: Follow-up of right breast cancer on anastrozole therapy  INTERVAL HISTORY: Cheyenne Lee is a 79 y.o. with above-mentioned history of right breast cancer treated with lumpectomy, radiation, and whois currently on anastrozole therapy. She presents to the clinic today for follow-up.  She tells me that the joint aches and pains are much better on anastrozole.  She could not tolerate letrozole previously.  Denies any lumps or nodules in the breast.  ALLERGIES:  is allergic to codeine and tape.  MEDICATIONS:  Current Outpatient Medications  Medication Sig Dispense Refill  . anastrozole (ARIMIDEX) 1 MG tablet Take 1 tablet (1 mg total) by mouth daily. 90 tablet 3  . dicyclomine (BENTYL) 10 MG capsule Take 10 mg by mouth as needed.     . fexofenadine (ALLEGRA) 180 MG tablet Take 180 mg by mouth as needed.     Marland Kitchen ibuprofen (ADVIL,MOTRIN) 200 MG tablet Take 200 mg by mouth every 6 (six) hours as needed.    . latanoprost (XALATAN) 0.005 % ophthalmic solution Place 1 drop into both eyes at bedtime.     . triamterene-hydrochlorothiazide (MAXZIDE-25) 37.5-25 MG tablet Take 1 tablet by mouth daily. 37.5-25     No current facility-administered medications for this visit.    PHYSICAL EXAMINATION: ECOG PERFORMANCE STATUS: 1 - Symptomatic but completely ambulatory  Vitals:   05/02/20 0937  BP: (!) 155/68  Pulse: 92  Resp: 18  Temp: 99.1 F (37.3 C)  SpO2: 98%   Filed Weights   05/02/20 1245  Weight: 182 lb 6.4 oz (82.7 kg)    BREAST: No palpable masses or nodules in either right or left breasts. No palpable axillary supraclavicular or infraclavicular adenopathy no breast tenderness or nipple discharge. (exam performed in the presence of a chaperone)  LABORATORY  DATA:  I have reviewed the data as listed CMP Latest Ref Rng & Units 07/10/2017  Glucose 65 - 99 mg/dL 142(H)  BUN 6 - 20 mg/dL 15  Creatinine 0.44 - 1.00 mg/dL 0.93  Sodium 135 - 145 mmol/L 137  Potassium 3.5 - 5.1 mmol/L 4.0  Chloride 101 - 111 mmol/L 102  CO2 22 - 32 mmol/L 24  Calcium 8.9 - 10.3 mg/dL 9.1    No results found for: WBC, HGB, HCT, MCV, PLT, NEUTROABS  ASSESSMENT & PLAN:  Malignant neoplasm of upper-outer quadrant of right breast in female, estrogen receptor positive ([facility]) Intermittent myalgias especially in the morning but she is able to manage them fairly well. Rt arm pain: Improved.  Anastrozole toxicities: Doing significantly better than letrozole.   Breast cancer surveillance: 1.Breast exam  05/02/2020: Benign 2.Mammogram 06/21/2019: Benign breast density category B. 3. Bone density March 2019: T score -0.9  RTC in 1 year    No orders of the defined types were placed in this encounter.  The patient has a good understanding of the overall plan. she agrees with it. she will call with any problems that may develop before the next visit here.  Total time spent: 20 mins including face to face time and time spent for planning, charting and coordination of care  Nicholas Lose, MD 05/02/2020  I, Cloyde Reams Dorshimer, am acting as scribe for Dr. Nicholas Lose.  I have reviewed the above documentation for accuracy and completeness, and I agree with the above.

## 2020-05-02 ENCOUNTER — Inpatient Hospital Stay: Payer: Medicare Other | Attending: Hematology and Oncology | Admitting: Hematology and Oncology

## 2020-05-02 ENCOUNTER — Other Ambulatory Visit: Payer: Self-pay

## 2020-05-02 DIAGNOSIS — C50411 Malignant neoplasm of upper-outer quadrant of right female breast: Secondary | ICD-10-CM | POA: Diagnosis not present

## 2020-05-02 DIAGNOSIS — M79601 Pain in right arm: Secondary | ICD-10-CM | POA: Insufficient documentation

## 2020-05-02 DIAGNOSIS — Z79899 Other long term (current) drug therapy: Secondary | ICD-10-CM | POA: Diagnosis not present

## 2020-05-02 DIAGNOSIS — Z170 Estrogen receptor positive status [ER+]: Secondary | ICD-10-CM

## 2020-05-02 DIAGNOSIS — Z79811 Long term (current) use of aromatase inhibitors: Secondary | ICD-10-CM | POA: Insufficient documentation

## 2020-05-02 DIAGNOSIS — M791 Myalgia, unspecified site: Secondary | ICD-10-CM | POA: Diagnosis not present

## 2020-05-02 MED ORDER — ANASTROZOLE 1 MG PO TABS: 1.0000 mg | ORAL_TABLET | Freq: Every day | ORAL | 3 refills | Status: DC

## 2020-05-02 NOTE — Assessment & Plan Note (Signed)
Intermittent myalgias especially in the morning but she is able to manage them fairly well. Rt arm pain: Improved.  Anastrozole toxicities: Severe myalgias and arthralgias in her legs arms and her back. I instructed her to switch to letrozole after taking 2 weeks break. Patient agrees to do that.  If she cannot tolerate even letrozole then she will take half a tablet letrozole. She will call us in a month to ask for a refill that can be sent to [facility] for a 90-day supply.  Breast cancer surveillance: 1.Breast exam  05/02/2020: Benign 2.Mammogram 06/21/2019: Benign breast density category B. 3. Bone density March 2019: T score -0.9  RTC in 1 year

## 2020-05-03 ENCOUNTER — Other Ambulatory Visit: Payer: Self-pay | Admitting: Hematology and Oncology

## 2020-05-03 DIAGNOSIS — Z1231 Encounter for screening mammogram for malignant neoplasm of breast: Secondary | ICD-10-CM

## 2020-05-08 DIAGNOSIS — Z23 Encounter for immunization: Secondary | ICD-10-CM | POA: Diagnosis not present

## 2020-06-05 DIAGNOSIS — Z23 Encounter for immunization: Secondary | ICD-10-CM | POA: Diagnosis not present

## 2020-06-21 ENCOUNTER — Other Ambulatory Visit: Payer: Self-pay

## 2020-06-21 ENCOUNTER — Ambulatory Visit
Admission: RE | Admit: 2020-06-21 | Discharge: 2020-06-21 | Disposition: A | Discharge status: Home / Self Care | Payer: Medicare Other | Source: Ambulatory Visit | Attending: Hematology and Oncology | Admitting: Hematology and Oncology

## 2020-06-21 ENCOUNTER — Other Ambulatory Visit: Payer: Self-pay | Admitting: Hematology and Oncology

## 2020-06-21 DIAGNOSIS — Z853 Personal history of malignant neoplasm of breast: Secondary | ICD-10-CM

## 2020-06-21 DIAGNOSIS — Z1231 Encounter for screening mammogram for malignant neoplasm of breast: Secondary | ICD-10-CM

## 2020-06-21 DIAGNOSIS — R928 Other abnormal and inconclusive findings on diagnostic imaging of breast: Secondary | ICD-10-CM | POA: Diagnosis not present

## 2020-08-22 DIAGNOSIS — H401111 Primary open-angle glaucoma, right eye, mild stage: Secondary | ICD-10-CM | POA: Diagnosis not present

## 2020-08-22 DIAGNOSIS — H401122 Primary open-angle glaucoma, left eye, moderate stage: Secondary | ICD-10-CM | POA: Diagnosis not present

## 2020-09-07 DIAGNOSIS — J00 Acute nasopharyngitis [common cold]: Secondary | ICD-10-CM | POA: Diagnosis not present

## 2020-10-11 ENCOUNTER — Encounter: Payer: Self-pay | Admitting: Dermatology

## 2020-10-11 ENCOUNTER — Ambulatory Visit (INDEPENDENT_AMBULATORY_CARE_PROVIDER_SITE_OTHER): Payer: Medicare Other | Admitting: Dermatology

## 2020-10-11 ENCOUNTER — Other Ambulatory Visit: Payer: Self-pay

## 2020-10-11 DIAGNOSIS — Z1283 Encounter for screening for malignant neoplasm of skin: Secondary | ICD-10-CM | POA: Diagnosis not present

## 2020-10-11 DIAGNOSIS — L719 Rosacea, unspecified: Secondary | ICD-10-CM

## 2020-10-11 DIAGNOSIS — L821 Other seborrheic keratosis: Secondary | ICD-10-CM

## 2020-10-11 DIAGNOSIS — L720 Epidermal cyst: Secondary | ICD-10-CM

## 2020-10-11 NOTE — Patient Instructions (Addendum)
Head, neck, upper chest, and back examined today.  There is no sign of any atypical mole or skin cancer.  Around the neck and on the back are a dozen rough brown keratoses which do not require watching or removal.  On the left scapula are some tiny open cysts which are quite harmless.  On the central face area are some subtle pink slightly puffy spots which represent rosacea exacerbated by the COVID mask.  The spot on the left bulb of the nose has historically shrunk, and with dermoscopy it shows no current signs of skin cancer.  I have asked Cheyenne Lee to return if the spot on the nose gets larger or bleeds, otherwise general skin check in 1 year

## 2020-10-15 ENCOUNTER — Encounter: Payer: Self-pay | Admitting: Dermatology

## 2020-10-15 NOTE — Progress Notes (Signed)
   Follow-Up Visit   Subjective  Cheyenne Lee is a 80 y.o. female who presents for the following: Skin Problem (Tip of nose was red swelling x months, scalp bumps she hits when combing hair).  Check Location: History of some redness on tip of nose, now improved Duration:  Quality:  Associated Signs/Symptoms: Modifying Factors:  Severity:  Timing: Context:   Objective  Well appearing patient in no apparent distress; mood and affect are within normal limits. Objective  Left Upper Back: Brown 5 mm flattopped textured papule  Objective  Right Upper Back: Noninflamed 1.2 cm open cyst  Objective  Chest - Medial [facility]): General skin examination: No atypical moles or melanoma or nonmelanoma skin cancer.    All sun exposed areas plus back examined.   Assessment & Plan    Seborrheic keratosis Left Upper Back  Leave if stable  Epidermal cyst Right Upper Back  May leave if stable  Screening exam for skin cancer Chest - Medial [facility])  Annual skin examination; patient encouraged to examine her own skin twice annually  Head, neck, upper chest, and back examined today.  There is no sign of any atypical mole or skin cancer.  Around the neck and on the back are a dozen rough brown keratoses which do not require watching or removal.  On the left scapula are some tiny open cysts which are quite harmless.  On the central face area are some subtle pink slightly puffy spots which represent rosacea exacerbated by the COVID mask.  The spot on the left bulb of the nose has historically shrunk, and with dermoscopy it shows no current signs of skin cancer.  I have asked Ms. Turnipseed to return if the spot on the nose gets larger or bleeds, otherwise general skin check in 1 year    I, Lavonna Monarch, MD, have reviewed all documentation for this visit.  The documentation on 10/15/20 for the exam, diagnosis, procedures, and orders are all accurate and complete.

## 2020-12-13 ENCOUNTER — Other Ambulatory Visit: Payer: Self-pay | Admitting: Hematology and Oncology

## 2020-12-13 DIAGNOSIS — Z853 Personal history of malignant neoplasm of breast: Secondary | ICD-10-CM

## 2020-12-13 DIAGNOSIS — Z1231 Encounter for screening mammogram for malignant neoplasm of breast: Secondary | ICD-10-CM

## 2020-12-13 DIAGNOSIS — Z9889 Other specified postprocedural states: Secondary | ICD-10-CM

## 2020-12-21 DIAGNOSIS — E7800 Pure hypercholesterolemia, unspecified: Secondary | ICD-10-CM | POA: Diagnosis not present

## 2020-12-21 DIAGNOSIS — I10 Essential (primary) hypertension: Secondary | ICD-10-CM | POA: Diagnosis not present

## 2020-12-21 DIAGNOSIS — K589 Irritable bowel syndrome without diarrhea: Secondary | ICD-10-CM | POA: Diagnosis not present

## 2020-12-21 DIAGNOSIS — M1990 Unspecified osteoarthritis, unspecified site: Secondary | ICD-10-CM | POA: Diagnosis not present

## 2020-12-21 DIAGNOSIS — Z0001 Encounter for general adult medical examination with abnormal findings: Secondary | ICD-10-CM | POA: Diagnosis not present

## 2020-12-21 DIAGNOSIS — R7303 Prediabetes: Secondary | ICD-10-CM | POA: Diagnosis not present

## 2020-12-21 DIAGNOSIS — C50911 Malignant neoplasm of unspecified site of right female breast: Secondary | ICD-10-CM | POA: Diagnosis not present

## 2020-12-21 DIAGNOSIS — Z23 Encounter for immunization: Secondary | ICD-10-CM | POA: Diagnosis not present

## 2020-12-21 DIAGNOSIS — Z79899 Other long term (current) drug therapy: Secondary | ICD-10-CM | POA: Diagnosis not present

## 2020-12-21 DIAGNOSIS — E2839 Other primary ovarian failure: Secondary | ICD-10-CM | POA: Diagnosis not present

## 2020-12-27 DIAGNOSIS — H401222 Low-tension glaucoma, left eye, moderate stage: Secondary | ICD-10-CM | POA: Diagnosis not present

## 2020-12-27 DIAGNOSIS — H401211 Low-tension glaucoma, right eye, mild stage: Secondary | ICD-10-CM | POA: Diagnosis not present

## 2020-12-28 ENCOUNTER — Other Ambulatory Visit: Payer: Self-pay | Admitting: Family Medicine

## 2020-12-28 DIAGNOSIS — E2839 Other primary ovarian failure: Secondary | ICD-10-CM

## 2021-01-11 DIAGNOSIS — H401222 Low-tension glaucoma, left eye, moderate stage: Secondary | ICD-10-CM | POA: Diagnosis not present

## 2021-01-11 DIAGNOSIS — H401211 Low-tension glaucoma, right eye, mild stage: Secondary | ICD-10-CM | POA: Diagnosis not present

## 2021-01-25 ENCOUNTER — Other Ambulatory Visit: Payer: Self-pay | Admitting: Family Medicine

## 2021-01-25 DIAGNOSIS — E2839 Other primary ovarian failure: Secondary | ICD-10-CM

## 2021-02-01 DIAGNOSIS — H401222 Low-tension glaucoma, left eye, moderate stage: Secondary | ICD-10-CM | POA: Diagnosis not present

## 2021-02-01 DIAGNOSIS — H401211 Low-tension glaucoma, right eye, mild stage: Secondary | ICD-10-CM | POA: Diagnosis not present

## 2021-04-24 ENCOUNTER — Other Ambulatory Visit: Payer: Self-pay | Admitting: Hematology and Oncology

## 2021-05-01 NOTE — Assessment & Plan Note (Signed)
Intermittent myalgias especially in the morning but she is able to manage them fairly well. Rt arm pain: Improved.  Anastrozole toxicities: Doing significantly better than letrozole.   Breast cancer surveillance: 1.Breast exam 05/02/2021: Benign 2.Mammogram 06/25/2021: Benign breast density category B. 3. Bone density March 2019: T score -0.9  RTC in 1 year

## 2021-05-01 NOTE — Progress Notes (Signed)
Patient Care Team: [name], MD as PCP - General (Family Medicine) Nicholas Lose, MD as Consulting Physician (Hematology and Oncology) Kyung Rudd, MD as Consulting Physician (Radiation Oncology) Donnie Mesa, MD as Consulting Physician (General Surgery) Gardenia Phlegm, NP as Nurse Practitioner (Hematology and Oncology)  DIAGNOSIS:    ICD-10-CM   1. Malignant neoplasm of upper-outer quadrant of right breast in female, estrogen receptor positive ([place])  C50.411    Z17.0       SUMMARY OF ONCOLOGIC HISTORY: Oncology History  Malignant neoplasm of upper-outer quadrant of right breast in female, estrogen receptor positive ([place])  07/15/2017 Surgery   Right lumpectomy: Grade 2 IDC with DCIS, margins negative, invasive tumor 0.8 cm the remainder was DCIS, 0/7 lymph nodes negative, ER 100%, PR 95%, Ki-67 10%, HER-2 negative ratio 1.43, T1b N0 stage I a    07/22/2017 Oncotype testing   Oncotype DX score 2 (risk of distant recurrence 4%)    09/08/2017 - 10/06/2017 Radiation Therapy   Adjuvant radiation    09/30/2017 Genetic Testing   Negative genetic testing on the Multi-cancer panel.  The Multi-Gene Panel offered by [facility] includes sequencing and/or deletion duplication testing of the following 83 genes: ALK, APC, ATM, AXIN2,BAP1,  BARD1, BLM, BMPR1A, BRCA1, BRCA2, BRIP1, CASR, CDC73, CDH1, CDK4, CDKN1B, CDKN1C, CDKN2A (p14ARF), CDKN2A (p16INK4a), CEBPA, CHEK2, CTNNA1, DICER1, DIS3L2, EGFR (c.2369C>T, p.Thr790Met variant only), EPCAM (Deletion/duplication testing only), FH, FLCN, GATA2, GPC3, GREM1 (Promoter region deletion/duplication testing only), HOXB13 (c.251G>A, p.Gly84Glu), HRAS, KIT, MAX, MEN1, MET, MITF (c.952G>A, p.Glu318Lys variant only), MLH1, MSH2, MSH3, MSH6, MUTYH, NBN, NF1, NF2, NTHL1, PALB2, PDGFRA, PHOX2B, PMS2, POLD1, POLE, POT1, PRKAR1A, PTCH1, PTEN, RAD50, RAD51C, RAD51D, RB1, RECQL4, RET, RUNX1, SDHAF2, SDHA (sequence changes only), SDHB, SDHC, SDHD,  SMAD4, SMARCA4, SMARCB1, SMARCE1, STK11, SUFU, TERT, TERT, TMEM127, TP53, TSC1, TSC2, VHL, WRN and WT1.  The report date is September 30, 2017.      11/06/2017 -  Anti-estrogen oral therapy   Anastrozole 1 mg daily       CHIEF COMPLIANT:  Follow-up of right breast cancer on anastrozole therapy   INTERVAL HISTORY: Cheyenne Lee is a 80 y.o. with above-mentioned history of right breast cancer treated with lumpectomy, radiation, and who is currently on anastrozole therapy. She presents to the clinic today for follow-up.  She is tolerating anastrozole extremely well without any problems or concerns but denies any lumps or nodules in the breast.  ALLERGIES:  is allergic to codeine and tape.  MEDICATIONS:  Current Outpatient Medications  Medication Sig Dispense Refill   anastrozole (ARIMIDEX) 1 MG tablet TAKE 1 TABLET (1 MG TOTAL) BY MOUTH DAILY. 90 tablet 3   dicyclomine (BENTYL) 10 MG capsule Take 10 mg by mouth as needed.      fexofenadine (ALLEGRA) 180 MG tablet Take 180 mg by mouth as needed.      ibuprofen (ADVIL,MOTRIN) 200 MG tablet Take 200 mg by mouth every 6 (six) hours as needed.     latanoprost (XALATAN) 0.005 % ophthalmic solution Place 1 drop into both eyes at bedtime.      triamterene-hydrochlorothiazide (MAXZIDE-25) 37.5-25 MG tablet Take 1 tablet by mouth daily. 37.5-25     No current facility-administered medications for this visit.    PHYSICAL EXAMINATION: ECOG PERFORMANCE STATUS: 1 - Symptomatic but completely ambulatory  Vitals:   05/02/21 0914  BP: (!) 154/57  Pulse: 73  Resp: 18  Temp: (!) 97.2 F (36.2 C)  SpO2: 99%   Filed Weights   05/02/21 0914  Weight: 178 lb 9.6 oz (81 kg)    BREAST: No palpable masses or nodules in either right or left breasts. No palpable axillary supraclavicular or infraclavicular adenopathy no breast tenderness or nipple discharge. (exam performed in the presence of a chaperone)  LABORATORY DATA:  I have reviewed the data as  listed CMP Latest Ref Rng & Units 07/10/2017  Glucose 65 - 99 mg/dL 142(H)  BUN 6 - 20 mg/dL 15  Creatinine 0.44 - 1.00 mg/dL 0.93  Sodium 135 - 145 mmol/L 137  Potassium 3.5 - 5.1 mmol/L 4.0  Chloride 101 - 111 mmol/L 102  CO2 22 - 32 mmol/L 24  Calcium 8.9 - 10.3 mg/dL 9.1    No results found for: WBC, HGB, HCT, MCV, PLT, NEUTROABS  ASSESSMENT & PLAN:  Malignant neoplasm of upper-outer quadrant of right breast in female, estrogen receptor positive (HCC) Intermittent myalgias especially in the morning but she is able to manage them fairly well. Rt arm pain: Improved.   Anastrozole toxicities: Doing significantly better than letrozole.     Breast cancer surveillance: 1. Breast exam  05/02/2021: Benign 2. Mammogram 06/25/2021: Benign breast density category B. 3. Bone density March 2019: T score -0.9   RTC in 1 year    No orders of the defined types were placed in this encounter.  The patient has a good understanding of the overall plan. she agrees with it. she will call with any problems that may develop before the next visit here.  Total time spent: 20 mins including face to face time and time spent for planning, charting and coordination of care  Rulon Eisenmenger, MD, MPH 05/02/2021  I, Thana Ates, am acting as scribe for Dr. Nicholas Lose.  I have reviewed the above documentation for accuracy and completeness, and I agree with the above.

## 2021-05-02 ENCOUNTER — Other Ambulatory Visit: Payer: Self-pay

## 2021-05-02 ENCOUNTER — Inpatient Hospital Stay: Payer: Medicare Other | Attending: Hematology and Oncology | Admitting: Hematology and Oncology

## 2021-05-02 DIAGNOSIS — Z923 Personal history of irradiation: Secondary | ICD-10-CM | POA: Diagnosis not present

## 2021-05-02 DIAGNOSIS — Z170 Estrogen receptor positive status [ER+]: Secondary | ICD-10-CM

## 2021-05-02 DIAGNOSIS — Z79899 Other long term (current) drug therapy: Secondary | ICD-10-CM | POA: Insufficient documentation

## 2021-05-02 DIAGNOSIS — C50411 Malignant neoplasm of upper-outer quadrant of right female breast: Secondary | ICD-10-CM | POA: Diagnosis not present

## 2021-05-02 DIAGNOSIS — Z79811 Long term (current) use of aromatase inhibitors: Secondary | ICD-10-CM | POA: Insufficient documentation

## 2021-05-02 DIAGNOSIS — M791 Myalgia, unspecified site: Secondary | ICD-10-CM | POA: Diagnosis not present

## 2021-05-02 MED ORDER — BETIMOL 0.25 % OP SOLN: 1.0000 [drp] | Freq: Two times a day (BID) | OPHTHALMIC | 12 refills | Status: AC

## 2021-05-14 DIAGNOSIS — S90562A Insect bite (nonvenomous), left ankle, initial encounter: Secondary | ICD-10-CM | POA: Diagnosis not present

## 2021-05-14 DIAGNOSIS — W57XXXA Bitten or stung by nonvenomous insect and other nonvenomous arthropods, initial encounter: Secondary | ICD-10-CM | POA: Diagnosis not present

## 2021-05-14 DIAGNOSIS — I10 Essential (primary) hypertension: Secondary | ICD-10-CM | POA: Diagnosis not present

## 2021-05-16 DIAGNOSIS — B351 Tinea unguium: Secondary | ICD-10-CM | POA: Diagnosis not present

## 2021-05-16 DIAGNOSIS — Z23 Encounter for immunization: Secondary | ICD-10-CM | POA: Diagnosis not present

## 2021-05-18 DIAGNOSIS — M25774 Osteophyte, right foot: Secondary | ICD-10-CM | POA: Diagnosis not present

## 2021-05-18 DIAGNOSIS — L600 Ingrowing nail: Secondary | ICD-10-CM | POA: Diagnosis not present

## 2021-06-04 DIAGNOSIS — Z23 Encounter for immunization: Secondary | ICD-10-CM | POA: Diagnosis not present

## 2021-06-25 ENCOUNTER — Other Ambulatory Visit: Payer: Self-pay

## 2021-06-25 ENCOUNTER — Ambulatory Visit
Admission: RE | Admit: 2021-06-25 | Discharge: 2021-06-25 | Disposition: A | Discharge status: Home / Self Care | Payer: Medicare Other | Source: Ambulatory Visit | Attending: Hematology and Oncology | Admitting: Hematology and Oncology

## 2021-06-25 DIAGNOSIS — Z853 Personal history of malignant neoplasm of breast: Secondary | ICD-10-CM | POA: Diagnosis not present

## 2021-06-25 DIAGNOSIS — R922 Inconclusive mammogram: Secondary | ICD-10-CM | POA: Diagnosis not present

## 2021-06-25 DIAGNOSIS — Z9889 Other specified postprocedural states: Secondary | ICD-10-CM

## 2021-06-25 DIAGNOSIS — Z1231 Encounter for screening mammogram for malignant neoplasm of breast: Secondary | ICD-10-CM

## 2021-07-10 ENCOUNTER — Other Ambulatory Visit: Payer: Self-pay

## 2021-07-10 ENCOUNTER — Ambulatory Visit
Admission: RE | Admit: 2021-07-10 | Discharge: 2021-07-10 | Disposition: A | Discharge status: Home / Self Care | Payer: Medicare Other | Source: Ambulatory Visit | Attending: Family Medicine | Admitting: Family Medicine

## 2021-07-10 DIAGNOSIS — M85852 Other specified disorders of bone density and structure, left thigh: Secondary | ICD-10-CM | POA: Diagnosis not present

## 2021-07-10 DIAGNOSIS — E2839 Other primary ovarian failure: Secondary | ICD-10-CM

## 2021-08-08 DIAGNOSIS — H401222 Low-tension glaucoma, left eye, moderate stage: Secondary | ICD-10-CM | POA: Diagnosis not present

## 2021-08-08 DIAGNOSIS — H401211 Low-tension glaucoma, right eye, mild stage: Secondary | ICD-10-CM | POA: Diagnosis not present

## 2021-08-29 DIAGNOSIS — H401222 Low-tension glaucoma, left eye, moderate stage: Secondary | ICD-10-CM | POA: Diagnosis not present

## 2021-08-29 DIAGNOSIS — H401211 Low-tension glaucoma, right eye, mild stage: Secondary | ICD-10-CM | POA: Diagnosis not present

## 2021-09-19 NOTE — Progress Notes (Signed)
[facility] Follow up:    Cheyenne Amel, MD [place] [address] [address]   DIAGNOSIS:  Cancer Staging  Malignant neoplasm of upper-outer quadrant of right breast in female, estrogen receptor positive ([place]) Staging form: Breast, AJCC 8th Edition - Clinical: Stage IA (cT1b, cN0, cM0, G2, ER+, PR+, HER2-) - Unsigned Histologic grading system: 3 grade system - Pathologic: Stage IA (pT1b, pN0, cM0, G2, ER+, PR+, HER2-) - Unsigned Histologic grading system: 3 grade system   SUMMARY OF ONCOLOGIC HISTORY: Oncology History  Malignant neoplasm of upper-outer quadrant of right breast in female, estrogen receptor positive ([place])  07/15/2017 Surgery   Right lumpectomy: Grade 2 IDC with DCIS, margins negative, invasive tumor 0.8 cm the remainder was DCIS, 0/7 lymph nodes negative, ER 100%, PR 95%, Ki-67 10%, HER-2 negative ratio 1.43, T1b N0 stage I a   07/22/2017 Oncotype testing   Oncotype DX score 2 (risk of distant recurrence 4%)   09/08/2017 - 10/06/2017 Radiation Therapy   Adjuvant radiation   09/30/2017 Genetic Testing   Negative genetic testing on the Multi-cancer panel.  The Multi-Gene Panel offered by [facility] includes sequencing and/or deletion duplication testing of the following 83 genes: ALK, APC, ATM, AXIN2,BAP1,  BARD1, BLM, BMPR1A, BRCA1, BRCA2, BRIP1, CASR, CDC73, CDH1, CDK4, CDKN1B, CDKN1C, CDKN2A (p14ARF), CDKN2A (p16INK4a), CEBPA, CHEK2, CTNNA1, DICER1, DIS3L2, EGFR (c.2369C>T, p.Thr790Met variant only), EPCAM (Deletion/duplication testing only), FH, FLCN, GATA2, GPC3, GREM1 (Promoter region deletion/duplication testing only), HOXB13 (c.251G>A, p.Gly84Glu), HRAS, KIT, MAX, MEN1, MET, MITF (c.952G>A, p.Glu318Lys variant only), MLH1, MSH2, MSH3, MSH6, MUTYH, NBN, NF1, NF2, NTHL1, PALB2, PDGFRA, PHOX2B, PMS2, POLD1, POLE, POT1, PRKAR1A, PTCH1, PTEN, RAD50, RAD51C, RAD51D, RB1, RECQL4, RET, RUNX1, SDHAF2, SDHA (sequence changes only), SDHB,  SDHC, SDHD, SMAD4, SMARCA4, SMARCB1, SMARCE1, STK11, SUFU, TERT, TERT, TMEM127, TP53, TSC1, TSC2, VHL, WRN and WT1.  The report date is September 30, 2017.     11/06/2017 -  Anti-estrogen oral therapy   Anastrozole 1 mg daily      CURRENT THERAPY: Anastrozole daily  INTERVAL HISTORY: Cheyenne Lee 80 y.o. female returns for evaluation of right breast pain and small nodules.   She says that the soreness began about 7 weeks ago and later small tiny nodules were felt on her right lateral breast towards her axilla.  She takes Ibuprofen for the pain which helps.  She notes the pain has worsened into her right upper arm also.   She continues on anastrozole daily.  Her most recent mammogram was completed on 06/25/2021 and showed no evidence of malignancy and breast density B.    Patient Active Problem List   Diagnosis Date Noted   Genetic testing 09/30/2017   Family history of colon cancer    Family history of breast cancer    Malignant neoplasm of upper-outer quadrant of right breast in female, estrogen receptor positive ([place]) 08/12/2017   Family history of malignant neoplasm of breast    HYPERLIPIDEMIA 10/21/2008   OBESITY 10/21/2008   HYPERTENSION 10/21/2008   ALLERGIC RHINITIS 10/21/2008   DIVERTICULOSIS, COLON 10/21/2008   FEVER UNSPECIFIED 10/14/2008   OTHER ABNORMAL BLOOD CHEMISTRY 10/14/2008    is allergic to codeine, latex, and tape.  MEDICAL HISTORY: Past Medical History:  Diagnosis Date   Arthritis    hips   Breast cancer ([place]) 06/25/2017   Right breast   Cancer ([place])    right breast DCIS   Complication of anesthesia    Family history of breast cancer  Family history of colon cancer    Family history of malignant neoplasm of breast    Glaucoma    H/O seasonal allergies    Hypertension    IBS (irritable bowel syndrome)    Personal history of radiation therapy    PONV (postoperative nausea and vomiting)     SURGICAL HISTORY: Past Surgical History:   Procedure Laterality Date   AXILLARY LYMPH NODE DISSECTION Right 07/29/2017   Procedure: AXILLARY LYMPH NODE DISSECTION;  Surgeon: Donnie Mesa, MD;  Location: [facility];  Service: General;  Laterality: Right;   AXILLARY SENTINEL NODE BIOPSY Right 07/29/2017   Procedure: RIGHT AXILLARY SENTINEL NODE BIOPSY;  Surgeon: Donnie Mesa, MD;  Location: [facility];  Service: General;  Laterality: Right;   BREAST BIOPSY     Multiple Biopsies, both breasts   BREAST LUMPECTOMY Right 2018   BREAST LUMPECTOMY WITH RADIOACTIVE SEED LOCALIZATION Right 07/15/2017   Procedure: RIGHT BREAST LUMPECTOMY WITH RADIOACTIVE SEED [place];  Surgeon: Donnie Mesa, MD;  Location: [facility];  Service: General;  Laterality: Right;  LMA   CESAREAN SECTION     x4    SOCIAL HISTORY: Social History   Socioeconomic History   Marital status: Married    Spouse name: Not on file   Number of children: Not on file   Years of education: Not on file   Highest education level: Not on file  Occupational History   Not on file  Tobacco Use   Smoking status: Former    Types: Cigarettes   Smokeless tobacco: Never   Tobacco comments:    was a social smoker but quit in the 1960s  Vaping Use   Vaping Use: Never used  Substance and Sexual Activity   Alcohol use: Yes    Comment: social   Drug use: No   Sexual activity: Not on file  Other Topics Concern   Not on file  Social History Narrative   Not on file   Social Determinants of Health   Financial Resource Strain: Not on file  Food Insecurity: Not on file  Transportation Needs: Not on file  Physical Activity: Not on file  Stress: Not on file  Social Connections: Not on file  Intimate Partner Violence: Not on file    FAMILY HISTORY: Family History  Problem Relation Age of Onset   Cancer Father 40       unk. primary; deceased 64   Breast cancer Sister 66       currently 71; history of NHL    Lung cancer Maternal Aunt        deceased 64; smoker   Lung cancer Maternal Uncle        2 uncles; smokers; deceased   Breast cancer Paternal Aunt 35       deceased 81s   Cancer Paternal Uncle        unk. primary; deceased late 56s   Breast cancer Paternal Grandmother        deceased 19   Heart attack Paternal Grandfather    Breast cancer Paternal Aunt 61       deceased 73s   Colon cancer Paternal 31    Breast cancer Cousin        pat cousin; daughter of an aunt w/ BC; dx 73s; deceased 49   Cancer Maternal Aunt        unk. primary; deceased 44s   Lung cancer Maternal Uncle  smoker   Cancer Other 59       lymphoma in niece; daughter of her brother; deceased   Dementia Mother    Heart attack Brother    Stroke Maternal Grandmother    Lung cancer Maternal Grandfather        deceased at 42   Cancer Paternal Uncle        unknown cancer   Colon cancer Cousin        died in his early 13s   Cancer Cousin        2 pat female cousins with unknown cancer   Colon polyps Son 41       6 polyps    Review of Systems  Constitutional:  Negative for appetite change, chills, fatigue, fever and unexpected weight change.  HENT:   Negative for hearing loss, lump/mass and trouble swallowing.   Eyes:  Negative for eye problems and icterus.  Respiratory:  Negative for chest tightness, cough and shortness of breath.   Cardiovascular:  Negative for chest pain, leg swelling and palpitations.  Gastrointestinal:  Negative for abdominal distention, abdominal pain, constipation, diarrhea, nausea and vomiting.  Endocrine: Negative for hot flashes.  Genitourinary:  Negative for difficulty urinating.   Musculoskeletal:  Negative for arthralgias.  Skin:  Negative for itching and rash.  Neurological:  Negative for dizziness, extremity weakness, headaches and numbness.  Hematological:  Negative for adenopathy. Does not bruise/bleed easily.  Psychiatric/Behavioral:  Negative for depression. The  patient is not nervous/anxious.      PHYSICAL EXAMINATION  ECOG PERFORMANCE STATUS: 1 - Symptomatic but completely ambulatory  Vitals:   09/20/21 1147  BP: (!) 146/57  Pulse: 74  Resp: 18  Temp: 98.1 F (36.7 C)  SpO2: 97%    Physical Exam Constitutional:      General: She is not in acute distress.    Appearance: Normal appearance. She is not toxic-appearing.  HENT:     Head: Normocephalic and atraumatic.  Eyes:     General: No scleral icterus. Cardiovascular:     Rate and Rhythm: Normal rate and regular rhythm.     Pulses: Normal pulses.     Heart sounds: Normal heart sounds.  Pulmonary:     Effort: Pulmonary effort is normal.     Breath sounds: Normal breath sounds.     Comments: Right breast status postlumpectomy, towards the axilla there is some very fine lumpiness in the breast.  It is unclear what it could be related to.  There is no erythema or swelling to the breast.  Her right arm is very slightly swollen Abdominal:     General: Abdomen is flat. Bowel sounds are normal. There is no distension.     Palpations: Abdomen is soft.     Tenderness: There is no abdominal tenderness.  Musculoskeletal:        General: No swelling.     Cervical back: Neck supple.  Lymphadenopathy:     Cervical: No cervical adenopathy.  Skin:    General: Skin is warm and dry.     Findings: No rash.  Neurological:     General: No focal deficit present.     Mental Status: She is alert.  Psychiatric:        Mood and Affect: Mood normal.        Behavior: Behavior normal.    LABORATORY DATA:  CBC No results found for: WBC, RBC, HGB, HCT, PLT, MCV, MCH, MCHC, RDW, LYMPHSABS, MONOABS, EOSABS, BASOSABS  CMP  Component Value Date/Time   NA 137 07/10/2017 1251   K 4.0 07/10/2017 1251   CL 102 07/10/2017 1251   CO2 24 07/10/2017 1251   GLUCOSE 142 (H) 07/10/2017 1251   BUN 15 07/10/2017 1251   CREATININE 0.93 07/10/2017 1251   CALCIUM 9.1 07/10/2017 1251   GFRNONAA 58 (L)  07/10/2017 1251   GFRAA >60 07/10/2017 1251       ASSESSMENT and THERAPY PLAN:   Malignant neoplasm of upper-outer quadrant of right breast in female, estrogen receptor positive (HCC) Intermittent myalgias especially in the morning but she is able to manage them fairly well. Rt arm pain: Improved.   Anastrozole toxicities: Doing significantly better than letrozole.     Cheyenne Lee is here today for urgent evaluation of some right-sided breast changes.  I have placed orders for right-sided diagnostic mammogram and ultrasound.  I also placed orders for physical therapy referral to be completed once her mammogram and ultrasound have been completed.  She is scheduled for follow-up in August with Dr. Lindi Adie and she will keep that appointment.       All questions were answered. The patient knows to call the clinic with any problems, questions or concerns. We can certainly see the patient much sooner if necessary.  Total encounter time: 20 minutes in face-to-face visit time, chart review, lab review, care coordination, and documentation of the encounter.  Wilber Bihari, NP 09/20/21 12:22 PM Medical Oncology and Hematology [facility] [address], [place] [address] Tel. [phone]    Fax. [phone]  *Total Encounter Time as defined by the Centers for Medicare and Medicaid Services includes, in addition to the face-to-face time of a patient visit (documented in the note above) non-face-to-face time: obtaining and reviewing outside history, ordering and reviewing medications, tests or procedures, care coordination (communications with other health care professionals or caregivers) and documentation in the medical record.

## 2021-09-19 NOTE — Assessment & Plan Note (Addendum)
Intermittent myalgias especially in the morning but she is able to manage them fairly well. Rt arm pain: Improved.  Anastrozole toxicities: Doing significantly better than letrozole.   Cheyenne Lee is here today for urgent evaluation of some right-sided breast changes.  I have placed orders for right-sided diagnostic mammogram and ultrasound.  I also placed orders for physical therapy referral to be completed once her mammogram and ultrasound have been completed.  She is scheduled for follow-up in August with Dr. Lindi Adie and she will keep that appointment.

## 2021-09-20 ENCOUNTER — Other Ambulatory Visit: Payer: Self-pay

## 2021-09-20 ENCOUNTER — Encounter: Payer: Self-pay | Admitting: Adult Health

## 2021-09-20 ENCOUNTER — Inpatient Hospital Stay: Payer: Medicare Other | Attending: Adult Health | Admitting: Adult Health

## 2021-09-20 VITALS — BP 146/57 | HR 74 | Temp 98.1°F | Resp 18 | Wt 177.6 lb

## 2021-09-20 DIAGNOSIS — Z170 Estrogen receptor positive status [ER+]: Secondary | ICD-10-CM | POA: Insufficient documentation

## 2021-09-20 DIAGNOSIS — M79621 Pain in right upper arm: Secondary | ICD-10-CM | POA: Insufficient documentation

## 2021-09-20 DIAGNOSIS — Z923 Personal history of irradiation: Secondary | ICD-10-CM | POA: Diagnosis not present

## 2021-09-20 DIAGNOSIS — Z803 Family history of malignant neoplasm of breast: Secondary | ICD-10-CM | POA: Insufficient documentation

## 2021-09-20 DIAGNOSIS — Z801 Family history of malignant neoplasm of trachea, bronchus and lung: Secondary | ICD-10-CM | POA: Diagnosis not present

## 2021-09-20 DIAGNOSIS — Z800 Family history of malignant neoplasm of digestive organs: Secondary | ICD-10-CM | POA: Insufficient documentation

## 2021-09-20 DIAGNOSIS — Z87891 Personal history of nicotine dependence: Secondary | ICD-10-CM | POA: Insufficient documentation

## 2021-09-20 DIAGNOSIS — C50411 Malignant neoplasm of upper-outer quadrant of right female breast: Secondary | ICD-10-CM | POA: Insufficient documentation

## 2021-09-20 DIAGNOSIS — Z809 Family history of malignant neoplasm, unspecified: Secondary | ICD-10-CM | POA: Insufficient documentation

## 2021-09-20 DIAGNOSIS — E785 Hyperlipidemia, unspecified: Secondary | ICD-10-CM | POA: Insufficient documentation

## 2021-09-20 DIAGNOSIS — I10 Essential (primary) hypertension: Secondary | ICD-10-CM | POA: Diagnosis not present

## 2021-09-26 ENCOUNTER — Other Ambulatory Visit: Payer: Self-pay | Admitting: *Deleted

## 2021-09-28 ENCOUNTER — Ambulatory Visit
Admission: RE | Admit: 2021-09-28 | Discharge: 2021-09-28 | Disposition: A | Discharge status: Home / Self Care | Payer: Medicare Other | Source: Ambulatory Visit | Attending: Adult Health | Admitting: Adult Health

## 2021-09-28 ENCOUNTER — Other Ambulatory Visit: Payer: Self-pay

## 2021-09-28 DIAGNOSIS — Z170 Estrogen receptor positive status [ER+]: Secondary | ICD-10-CM

## 2021-09-28 DIAGNOSIS — C50411 Malignant neoplasm of upper-outer quadrant of right female breast: Secondary | ICD-10-CM

## 2021-09-28 DIAGNOSIS — Z853 Personal history of malignant neoplasm of breast: Secondary | ICD-10-CM | POA: Diagnosis not present

## 2021-09-28 DIAGNOSIS — R922 Inconclusive mammogram: Secondary | ICD-10-CM | POA: Diagnosis not present

## 2021-10-02 DIAGNOSIS — H401222 Low-tension glaucoma, left eye, moderate stage: Secondary | ICD-10-CM | POA: Diagnosis not present

## 2021-10-02 DIAGNOSIS — H401211 Low-tension glaucoma, right eye, mild stage: Secondary | ICD-10-CM | POA: Diagnosis not present

## 2021-10-15 ENCOUNTER — Ambulatory Visit (INDEPENDENT_AMBULATORY_CARE_PROVIDER_SITE_OTHER): Payer: Medicare Other | Admitting: Dermatology

## 2021-10-15 ENCOUNTER — Other Ambulatory Visit: Payer: Self-pay

## 2021-10-15 ENCOUNTER — Encounter: Payer: Self-pay | Admitting: Dermatology

## 2021-10-15 ENCOUNTER — Encounter (INDEPENDENT_AMBULATORY_CARE_PROVIDER_SITE_OTHER): Payer: Self-pay

## 2021-10-15 DIAGNOSIS — Z1283 Encounter for screening for malignant neoplasm of skin: Secondary | ICD-10-CM

## 2021-10-15 DIAGNOSIS — L821 Other seborrheic keratosis: Secondary | ICD-10-CM

## 2021-10-15 DIAGNOSIS — L304 Erythema intertrigo: Secondary | ICD-10-CM | POA: Diagnosis not present

## 2021-10-15 DIAGNOSIS — D1801 Hemangioma of skin and subcutaneous tissue: Secondary | ICD-10-CM

## 2021-10-15 NOTE — Patient Instructions (Signed)
Triple paste AF

## 2021-11-03 ENCOUNTER — Encounter: Payer: Self-pay | Admitting: Dermatology

## 2021-11-03 NOTE — Progress Notes (Signed)
° °  Follow-Up Visit   Subjective  Cheyenne Lee is a 81 y.o. female who presents for the following: Annual Exam (Patient states she is having fungus under the left breast. Using gold bond powder. Lesion on mid chest x years. Lesion on back x years. ).  General skin examination, check rash under breast and new lesions. Location:  Duration:  Quality:  Associated Signs/Symptoms: Modifying Factors:  Severity:  Timing: Context:   Objective  Well appearing patient in no apparent distress; mood and affect are within normal limits. Left Inframammary Fold Subtly glazed erythema without pustules or satellite lesions more likely irritant dermatitis than primary cancer  Left Abdomen (side) - Upper, Left Breast, Mid Back, Right Forearm - Anterior Multiple textured light brown 4 to 6 mm flattopped papules, typical dermoscopy  Left Forearm - Anterior Multiple 1 mm smooth red dermal papules  Mid Back Waist up skin exam.  No atypical pigmented lesions or nonmelanoma skin cancer    All skin waist up examined.   Assessment & Plan    Intertrigo Left Inframammary Fold  Triple paste AF over-the-counter product, apply daily after bathing for 2 to 3 weeks and then on a as needed basis if clears  Seborrheic keratosis (4) Right Forearm - Anterior; Left Breast; Left Abdomen (side) - Upper; Mid Back  Leave if stable  Hemangioma of skin Left Forearm - Anterior  No intervention necessary  Encounter for screening for malignant neoplasm of skin Mid Back  Annual skin examination      I, Lavonna Monarch, MD, have reviewed all documentation for this visit.  The documentation on 11/03/21 for the exam, diagnosis, procedures, and orders are all accurate and complete.

## 2021-11-13 ENCOUNTER — Emergency Department (HOSPITAL_BASED_OUTPATIENT_CLINIC_OR_DEPARTMENT_OTHER)
Admission: EM | Admit: 2021-11-13 | Discharge: 2021-11-13 | Disposition: A | Discharge status: Home / Self Care | Payer: Medicare Other | Attending: Emergency Medicine | Admitting: Emergency Medicine

## 2021-11-13 ENCOUNTER — Emergency Department (HOSPITAL_BASED_OUTPATIENT_CLINIC_OR_DEPARTMENT_OTHER): Payer: Medicare Other

## 2021-11-13 ENCOUNTER — Encounter (HOSPITAL_BASED_OUTPATIENT_CLINIC_OR_DEPARTMENT_OTHER): Payer: Self-pay

## 2021-11-13 ENCOUNTER — Other Ambulatory Visit: Payer: Self-pay

## 2021-11-13 DIAGNOSIS — S0181XA Laceration without foreign body of other part of head, initial encounter: Secondary | ICD-10-CM | POA: Insufficient documentation

## 2021-11-13 DIAGNOSIS — W01198A Fall on same level from slipping, tripping and stumbling with subsequent striking against other object, initial encounter: Secondary | ICD-10-CM | POA: Insufficient documentation

## 2021-11-13 DIAGNOSIS — M47812 Spondylosis without myelopathy or radiculopathy, cervical region: Secondary | ICD-10-CM | POA: Diagnosis not present

## 2021-11-13 DIAGNOSIS — S0101XA Laceration without foreign body of scalp, initial encounter: Secondary | ICD-10-CM

## 2021-11-13 DIAGNOSIS — Z043 Encounter for examination and observation following other accident: Secondary | ICD-10-CM | POA: Diagnosis not present

## 2021-11-13 MED ORDER — ACETAMINOPHEN 325 MG PO TABS
650.0000 mg | ORAL_TABLET | Freq: Once | ORAL | Status: AC
  Administered 2021-11-13: 650 mg via ORAL

## 2021-11-13 NOTE — ED Triage Notes (Signed)
Pt states she was climbing into a chair and lost her balance and she fell hitting rt. Side of head on refrigerator     +laceration bleeding controlled No blood thinners and denies losing consciousness

## 2021-11-13 NOTE — Discharge Instructions (Addendum)
Your CT scan of the head and neck were without fracture or other acute findings.  He had 3 staples placed today to repair your laceration.  You will need to either return to the emergency room or follow-up with your primary care provider to have the staples removed in 10 to 14 days.  If you have signs of infection including swelling, drainage, redness, or fever please return for evaluation.

## 2021-11-13 NOTE — ED Provider Notes (Signed)
[facility] EMERGENCY DEPT Provider Note   CSN: [id number] Arrival date & time: 11/13/21  1048     History  Chief Complaint  Patient presents with   Laceration    Cheyenne Lee is a 81 y.o. female.  81 year old female presents with her husband for evaluation of laceration to the scalp.  This occurred around 10 AM.  Patient states she was cleaning when she attempted to stand on a chair barefoot and slipped and fell backwards.  She states she hit her head on the refrigerator and fell onto the ground.  She denies loss of consciousness.  She states her spine fell onto the floor and denies hitting anything else on her way down.  She is not on blood thinners.  Denies any prodromal symptoms prior to the fall.  States it was strictly mechanical.  States they are worse active bleeding at the time of the incident.  Currently controlled.  Denies any joint pain.  Without complaints of back pain.  The history is provided by the patient and the spouse. No language interpreter was used.      Home Medications Prior to Admission medications   Medication Sig Start Date End Date Taking? Authorizing Provider  anastrozole (ARIMIDEX) 1 MG tablet TAKE 1 TABLET (1 MG TOTAL) BY MOUTH DAILY. 04/25/21   Nicholas Lose, MD  anastrozole (ARIMIDEX) 1 MG tablet 1 tablet    [provider]  Cholecalciferol (VITAMIN D3) 1000000 UNIT/GM LIQD See admin instructions.    [provider]  dicyclomine (BENTYL) 10 MG capsule Take 10 mg by mouth as needed.     [provider]  dicyclomine (BENTYL) 20 MG tablet 1 tablet 07/05/21   [provider]  fexofenadine (ALLEGRA) 180 MG tablet Take 180 mg by mouth as needed.     [provider]  ibuprofen (ADVIL,MOTRIN) 200 MG tablet Take 200 mg by mouth every 6 (six) hours as needed.    [provider]  latanoprost (XALATAN) 0.005 % ophthalmic solution Place 1 drop into both eyes nightly. 10/20/18   [provider]  timolol (BETIMOL) 0.25 % ophthalmic solution Place 1-2 drops into both eyes 2 (two) times daily. 05/02/21   Nicholas Lose, MD  timolol (TIMOPTIC) 0.25 % ophthalmic solution 1 drop into affected eye    [provider]  triamterene-hydrochlorothiazide (MAXZIDE-25) 37.5-25 MG tablet Take 1 tablet by mouth daily. 37.5-25 04/26/14   [provider]      Allergies    Codeine, Latex, and Tape    Review of Systems   Review of Systems  Constitutional:  Negative for chills and fever.  Respiratory:  Negative for shortness of breath.   Cardiovascular:  Negative for chest pain.  Musculoskeletal:  Negative for arthralgias, back pain, neck pain and neck stiffness.  Skin:  Positive for wound.  Neurological:  Positive for headaches.  All other systems reviewed and are negative.  Physical Exam Updated Vital Signs BP (!) 178/82 (BP Location: Left Arm)    Pulse 80    Temp 98.2 F (36.8 C)    Resp 16    Ht 5\' 5"  (1.651 m)    Wt 78.9 kg    SpO2 98%    BMI 28.96 kg/m  Physical Exam Vitals and nursing note reviewed.  Constitutional:      General: She is not in acute distress.    Appearance: Normal appearance. She is not ill-appearing.  HENT:     Head: Normocephalic and atraumatic.  Nose: Nose normal.  Eyes:     Conjunctiva/sclera: Conjunctivae normal.  Cardiovascular:     Rate and Rhythm: Normal rate and regular rhythm.  Pulmonary:     Effort: Pulmonary effort is normal. No respiratory distress.     Breath sounds: Normal breath sounds. No wheezing.  Musculoskeletal:        General: No deformity.     Comments: Cervical spine, thoracic spine, lumbar spine without tenderness to palpation.  Paraspinal muscles without tenderness to palpation.  Bilateral upper and lower extremities with full range of motion and without tenderness to palpation.  Hips without tenderness to palpation.  Skin:    Findings: No rash.  Neurological:     General: No focal deficit present.      Mental Status: She is alert and oriented to person, place, and time.     Cranial Nerves: No cranial nerve deficit.    ED Results / Procedures / Treatments   Labs (all labs ordered are listed, but only abnormal results are displayed) Labs Reviewed - No data to display  EKG None  Radiology CT Head Wo Contrast  Result Date: 11/13/2021 CLINICAL DATA:  Fall hit head on refrigerator. Scalp laceration. No loss of consciousness. EXAM: CT HEAD WITHOUT CONTRAST CT CERVICAL SPINE WITHOUT CONTRAST TECHNIQUE: Multidetector CT imaging of the head and cervical spine was performed following the standard protocol without intravenous contrast. Multiplanar CT image reconstructions of the cervical spine were also generated. RADIATION DOSE REDUCTION: This exam was performed according to the departmental dose-optimization program which includes automated exposure control, adjustment of the mA and/or kV according to patient size and/or use of iterative reconstruction technique. COMPARISON:  None. FINDINGS: CT HEAD FINDINGS Brain: No acute infarct, hemorrhage, or mass lesion is present. No significant white matter lesions are present. The ventricles are of normal size. No significant extraaxial fluid collection is present. The brainstem and cerebellum are within normal limits. Vascular: No hyperdense vessel or unexpected calcification. Skull: Calvarium is intact. No focal lytic or blastic lesions are present. Right frontal scalp laceration and small hematoma is present. No underlying fracture is present. No foreign body is present. Sinuses/Orbits: The paranasal sinuses and mastoid air cells are clear. Bilateral lens replacements are noted. Globes and orbits are otherwise unremarkable. CT CERVICAL SPINE FINDINGS Alignment: Minimal degenerative anterolisthesis present C3-4 and C4-5. Slight degenerative anterolisthesis is also present at C7-T1. Straightening of the normal cervical lordosis is present. Skull base and vertebrae:  Craniocervical junction is within normal limits. Vertebral body heights are within normal limits. No acute or healing fractures are present. Soft tissues and spinal canal: No prevertebral fluid or swelling. No visible canal hematoma. Disc levels: Severe right foraminal narrowing is present at C3-4 secondary to uncovertebral and facet disease. Moderate foraminal narrowing due to facet hypertrophy at C4-5 is worse on the left. Right greater than left moderate foraminal narrowing at C5-6 is mostly due to uncovertebral spurring. Moderate foraminal narrowing at C6-7 due to uncovertebral spurring is worse on the left. Asymmetric left-sided facet hypertrophy is present at C7-T1 without significant stenosis. Upper chest: The lung apices are clear. Thoracic inlet is within normal limits. IMPRESSION: 1. Right frontal scalp laceration and small hematoma without underlying fracture. 2. Normal CT appearance of the brain. 3. Multilevel degenerative changes of the cervical spine without acute fracture or traumatic subluxation. Electronically Signed   By: San Morelle M.D.   On: 11/13/2021 13:12   CT Cervical Spine Wo Contrast  Result Date: 11/13/2021 CLINICAL DATA:  Fall  hit head on refrigerator. Scalp laceration. No loss of consciousness. EXAM: CT HEAD WITHOUT CONTRAST CT CERVICAL SPINE WITHOUT CONTRAST TECHNIQUE: Multidetector CT imaging of the head and cervical spine was performed following the standard protocol without intravenous contrast. Multiplanar CT image reconstructions of the cervical spine were also generated. RADIATION DOSE REDUCTION: This exam was performed according to the departmental dose-optimization program which includes automated exposure control, adjustment of the mA and/or kV according to patient size and/or use of iterative reconstruction technique. COMPARISON:  None. FINDINGS: CT HEAD FINDINGS Brain: No acute infarct, hemorrhage, or mass lesion is present. No significant white matter lesions  are present. The ventricles are of normal size. No significant extraaxial fluid collection is present. The brainstem and cerebellum are within normal limits. Vascular: No hyperdense vessel or unexpected calcification. Skull: Calvarium is intact. No focal lytic or blastic lesions are present. Right frontal scalp laceration and small hematoma is present. No underlying fracture is present. No foreign body is present. Sinuses/Orbits: The paranasal sinuses and mastoid air cells are clear. Bilateral lens replacements are noted. Globes and orbits are otherwise unremarkable. CT CERVICAL SPINE FINDINGS Alignment: Minimal degenerative anterolisthesis present C3-4 and C4-5. Slight degenerative anterolisthesis is also present at C7-T1. Straightening of the normal cervical lordosis is present. Skull base and vertebrae: Craniocervical junction is within normal limits. Vertebral body heights are within normal limits. No acute or healing fractures are present. Soft tissues and spinal canal: No prevertebral fluid or swelling. No visible canal hematoma. Disc levels: Severe right foraminal narrowing is present at C3-4 secondary to uncovertebral and facet disease. Moderate foraminal narrowing due to facet hypertrophy at C4-5 is worse on the left. Right greater than left moderate foraminal narrowing at C5-6 is mostly due to uncovertebral spurring. Moderate foraminal narrowing at C6-7 due to uncovertebral spurring is worse on the left. Asymmetric left-sided facet hypertrophy is present at C7-T1 without significant stenosis. Upper chest: The lung apices are clear. Thoracic inlet is within normal limits. IMPRESSION: 1. Right frontal scalp laceration and small hematoma without underlying fracture. 2. Normal CT appearance of the brain. 3. Multilevel degenerative changes of the cervical spine without acute fracture or traumatic subluxation. Electronically Signed   By: San Morelle M.D.   On: 11/13/2021 13:12     Procedures .Marland KitchenLaceration Repair  Date/Time: 11/13/2021 2:10 PM Performed by: Evlyn Courier, PA-C Authorized by: Evlyn Courier, PA-C   Consent:    Consent obtained:  Verbal   Consent given by:  Patient   Risks discussed:  Infection, need for additional repair, pain, poor cosmetic result and poor wound healing   Alternatives discussed:  No treatment and delayed treatment Universal protocol:    Procedure explained and questions answered to patient or proxy's satisfaction: yes     Relevant documents present and verified: yes     Imaging studies available: yes     Patient identity confirmed:  Verbally with patient Anesthesia:    Anesthesia method:  Local infiltration Laceration details:    Location:  Scalp   Scalp location:  R parietal   Length (cm):  3 Pre-procedure details:    Preparation:  Imaging obtained to evaluate for foreign bodies Exploration:    Hemostasis achieved with:  Direct pressure   Imaging obtained comment:  CT head   Imaging outcome: foreign body not noted     Wound extent: no muscle damage noted, no nerve damage noted, no tendon damage noted, no underlying fracture noted and no vascular damage noted   Treatment:  Area cleansed with:  Saline   Amount of cleaning:  Standard   Irrigation solution:  Sterile saline   Irrigation method:  Syringe Skin repair:    Repair method:  Staples   Number of staples:  3 Approximation:    Approximation:  Close Repair type:    Repair type:  Simple Post-procedure details:    Procedure completion:  Tolerated well, no immediate complications    Medications Ordered in ED Medications - No data to display  ED Course/ Medical Decision Making/ A&P                           Medical Decision Making Amount and/or Complexity of Data Reviewed Radiology: ordered.   Medical Decision Making / ED Course   This patient presents to the ED for concern of fall, laceration of scalp, this involves an extensive number of treatment  options, and is a complaint that carries with it a high risk of complications and morbidity.  The differential diagnosis includes syncope, laceration, intracranial hemorrhage  MDM: 81 year old female presents today following fall and laceration to scalp.  This was a mechanical fall.  Patient attempted to stand up on chair barefooted and slipped and fell.  Without complaints of other injury.  Exam without joint tenderness or deformity.  3 cm scalp laceration present on the right parietal region.  She is without headache, visual change, or focal neurodeficits.  Cranial nerves III through XII intact.  Full range of motion in bilateral upper and lower extremities. Repaired with 3 staples.  CT head and cervical spine negative for acute findings.  Patient denies loss of consciousness.  She is not on anticoagulation.  Return precautions discussed.  Patient voices understanding and is in agreement with plan.   Additional history obtained: -Additional history obtained from husband who is at bedside. -External records from outside source obtained and reviewed including: Chart review including previous notes, labs, imaging, consultation notes   Lab Tests: -I ordered, reviewed, and interpreted labs.   The pertinent results include:   Labs Reviewed - No data to display    EKG  EKG Interpretation  Date/Time:    Ventricular Rate:    PR Interval:    QRS Duration:   QT Interval:    QTC Calculation:   R Axis:     Text Interpretation:           Imaging Studies ordered: I ordered imaging studies including CT head, CT cervical spine I independently visualized and interpreted imaging. I agree with the radiologist interpretation   Medicines ordered and prescription drug management: No orders of the defined types were placed in this encounter.   -I have reviewed the patients home medicines and have made adjustments as needed  Reevaluation: After the interventions noted above, I reevaluated the  patient and found that they have :stayed the same  Co morbidities that complicate the patient evaluation  Past Medical History:  Diagnosis Date   Arthritis    hips   Breast cancer ([place] [place]) 06/25/2017   Right breast   Cancer ([place])    right breast DCIS   Complication of anesthesia    Family history of breast cancer    Family history of colon cancer    Family history of malignant neoplasm of breast    Glaucoma    H/O seasonal allergies    Hypertension    IBS (irritable bowel syndrome)    Personal history of radiation therapy  PONV (postoperative nausea and vomiting)       Dispostion: Patient is appropriate for discharge.  Discharged in stable condition.  Return precautions discussed.  Discussed follow-up with PCP.  Patient voices understanding and is in agreement with plan.   Final Clinical Impression(s) / ED Diagnoses Final diagnoses:  Laceration of scalp, initial encounter    Rx / DC Orders ED Discharge Orders     None         Evlyn Courier, PA-C 11/13/21 1411    Regan Lemming, MD 11/13/21 [phone]

## 2021-11-13 NOTE — ED Notes (Signed)
Patient transported to CT 

## 2021-11-19 ENCOUNTER — Other Ambulatory Visit: Payer: Self-pay | Admitting: Hematology and Oncology

## 2021-11-19 DIAGNOSIS — Z1231 Encounter for screening mammogram for malignant neoplasm of breast: Secondary | ICD-10-CM

## 2021-11-26 DIAGNOSIS — S0191XD Laceration without foreign body of unspecified part of head, subsequent encounter: Secondary | ICD-10-CM | POA: Diagnosis not present

## 2021-12-05 ENCOUNTER — Ambulatory Visit: Payer: Medicare Other | Attending: Adult Health | Admitting: Physical Therapy

## 2021-12-05 ENCOUNTER — Encounter: Payer: Self-pay | Admitting: Physical Therapy

## 2021-12-05 ENCOUNTER — Other Ambulatory Visit: Payer: Self-pay

## 2021-12-05 DIAGNOSIS — M79621 Pain in right upper arm: Secondary | ICD-10-CM

## 2021-12-05 DIAGNOSIS — M25511 Pain in right shoulder: Secondary | ICD-10-CM | POA: Diagnosis not present

## 2021-12-05 DIAGNOSIS — M25611 Stiffness of right shoulder, not elsewhere classified: Secondary | ICD-10-CM

## 2021-12-05 DIAGNOSIS — R293 Abnormal posture: Secondary | ICD-10-CM | POA: Diagnosis not present

## 2021-12-05 DIAGNOSIS — C50411 Malignant neoplasm of upper-outer quadrant of right female breast: Secondary | ICD-10-CM | POA: Diagnosis not present

## 2021-12-05 DIAGNOSIS — Z170 Estrogen receptor positive status [ER+]: Secondary | ICD-10-CM

## 2021-12-05 DIAGNOSIS — M6281 Muscle weakness (generalized): Secondary | ICD-10-CM | POA: Diagnosis not present

## 2021-12-05 NOTE — Therapy (Addendum)
?OUTPATIENT PHYSICAL THERAPY ONCOLOGY EVALUATION ? ?Patient Name: Cheyenne Lee ?MRN: [id number] ?DOB:Aug 08, 1941, 81 y.o., female ?Today's Date: 12/05/2021 ? ? PT End of Session - 12/05/21 0943   ? ? Visit Number 1   ? Number of Visits 9   ? Date for PT Re-Evaluation 01/02/22   ? PT Start Time 0902   ? PT Stop Time [phone]   ? PT Time Calculation (min) 41 min   ? Activity Tolerance Patient tolerated treatment well   ? Behavior During Therapy [facility] for tasks assessed/performed   ? ?  ?  ? ?  ? ? ?Past Medical History:  ?Diagnosis Date  ? Arthritis   ? hips  ? Breast cancer ([place]) 06/25/2017  ? Right breast  ? Cancer ([place])   ? right breast DCIS  ? Complication of anesthesia   ? Family history of breast cancer   ? Family history of colon cancer   ? Family history of malignant neoplasm of breast   ? Glaucoma   ? H/O seasonal allergies   ? Hypertension   ? IBS (irritable bowel syndrome)   ? Personal history of radiation therapy   ? PONV (postoperative nausea and vomiting)   ? ?Past Surgical History:  ?Procedure Laterality Date  ? AXILLARY LYMPH NODE DISSECTION Right 07/29/2017  ? Procedure: AXILLARY LYMPH NODE DISSECTION;  Surgeon: Donnie Mesa, MD;  Location: [facility];  Service: General;  Laterality: Right;  ? AXILLARY SENTINEL NODE BIOPSY Right 07/29/2017  ? Procedure: RIGHT AXILLARY SENTINEL NODE BIOPSY;  Surgeon: Donnie Mesa, MD;  Location: [facility];  Service: General;  Laterality: Right;  ? BREAST BIOPSY    ? Multiple Biopsies, both breasts  ? BREAST LUMPECTOMY Right 2018  ? BREAST LUMPECTOMY WITH RADIOACTIVE SEED LOCALIZATION Right 07/15/2017  ? Procedure: RIGHT BREAST LUMPECTOMY WITH RADIOACTIVE SEED LOCALIZATION ERAS PATHWAY;  Surgeon: Donnie Mesa, MD;  Location: [facility];  Service: General;  Laterality: Right;  LMA  ? CESAREAN SECTION    ? x4  ? ?Patient Active Problem List  ? Diagnosis Date Noted  ? Genetic testing 09/30/2017  ? Family history of colon cancer    ? Family history of breast cancer   ? Malignant neoplasm of upper-outer quadrant of right breast in female, estrogen receptor positive ([place]) 08/12/2017  ? Family history of malignant neoplasm of breast   ? HYPERLIPIDEMIA 10/21/2008  ? OBESITY 10/21/2008  ? HYPERTENSION 10/21/2008  ? ALLERGIC RHINITIS 10/21/2008  ? DIVERTICULOSIS, COLON 10/21/2008  ? FEVER UNSPECIFIED 10/14/2008  ? OTHER ABNORMAL BLOOD CHEMISTRY 10/14/2008  ? ? ?PCP: [name], MD ? ?REFERRING PROVIDER: Wilber Bihari Cornett* ? ?REFERRING DIAG: C50.411,Z17.0 (ICD-10-CM) - Malignant neoplasm of upper-outer quadrant of right breast in female, estrogen receptor positive ([place])  ? ?THERAPY DIAG:  ?Acute pain of right shoulder ? ?Pain in right upper arm ? ?Stiffness of right shoulder, not elsewhere classified ? ?Muscle weakness (generalized) ? ?Abnormal posture ? ?Malignant neoplasm of upper-outer quadrant of right breast in female, estrogen receptor positive ([place]) ? ?ONSET DATE: 07/15/17 ? ?SUBJECTIVE                                                                                                                                                                                          ? ?  SUBJECTIVE STATEMENT: If I try to sleep on the R side I can not sleep. I have severe pains in the right shoulder. It takes me a while to get the arm to move to the left side. I have lymphedema in the left side. I can not lift anything heavy on the R. I have a compression sleeve for the R side but I stopped wearing it because I did not feel like I needed it anymore.  ? ?PERTINENT HISTORY: 07/15/2017 ?Right breast cancer, Right lumpectomy: Grade 2 IDC with DCIS, margins negative, invasive tumor 0.8 cm the remainder was DCIS, 0/7 lymph nodes negative, ER 100%, PR 95%, Ki-67 10%, HER-2 negative ratio 1.43, T1b N0 stage I a; Radiation Therapy 09/08/2017 - 10/06/2017, Anti-estrogen oral therapy 11/06/17 - present, Anastrozole 1 mg daily, had a bad fall on 11/13/21 which  required staples in her head (was standing on chair trying to reach something up high) ?  ? ?PAIN:  ?Are you having pain? Yes ?NPRS scale: 7/10 ?Pain location: R shoulder/upper quadrant ?Pain orientation: Right  ?PAIN TYPE: aching and sharp ?Pain description: intermittent  ?Aggravating factors: sleeping on that side, trying to roll to L side ?Relieving factors: sleeping on L side ? ?PRECAUTIONS: Other: at risk for lymphedema in LUE ? ?WEIGHT BEARING RESTRICTIONS No ? ?FALLS:  ?Has patient fallen in last 6 months? Yes, Number of falls: 1 - stood on chair and lost balance and fell hitting head on fridge requiring staples ? ?LIVING ENVIRONMENT: ?Lives with: lives with their spouse ?Lives in: House/apartment ?Stairs: Yes; Internal: 2 steps steps; can reach both, pt reports she goes down steps with step to gait pattern ?Has following equipment at home: Quad cane small base ? ?OCCUPATION: retried ? ?LEISURE: pt does not currently exercise, used to ride indoor bike a year ago but has not recently ? ?HAND DOMINANCE : right  ? ?PRIOR LEVEL OF FUNCTION: Independent and Independent with community mobility with device ? ?PATIENT GOALS to eliminate some of the pain in the R arm and shoulder ? ? ?OBJECTIVE ? ?COGNITION: ? Overall cognitive status: Within functional limits for tasks assessed  ? ?PALPATION: Pt has tender palpable bumps in her upper arm that feel similar to lipomas ? ?OBSERVATIONS / OTHER ASSESSMENTS: increased scar tissue palpable at scar line on R, increased muscle tension throughout R upper quadrant ? ? ?POSTURE: rounded shoulders, forward head ? ?UPPER EXTREMITY AROM/PROM: ? ?A/PROM RIGHT  12/05/2021 ?  ?Shoulder extension 50  ?Shoulder flexion 158 (discomfort in superior shoulder)  ?Shoulder abduction 155 (discomfort in superior shoulder)  ?Shoulder internal rotation 26 with P!  ?Shoulder external rotation 90  ?  (Blank rows = not tested) ? ?A/PROM LEFT  12/05/2021  ?Shoulder extension 45  ?Shoulder flexion 170   ?Shoulder abduction 160  ?Shoulder internal rotation 49  ?Shoulder external rotation 84  ?  (Blank rows = not tested) ? ? ? ?LYMPHEDEMA ASSESSMENTS:  ? ?SURGERY TYPE/DATE: 07/15/22 R lumpectomy and SLNB (had 2 surgeries - first was lumpectomy and 2nd was SLNB) ? ?NUMBER OF LYMPH NODES REMOVED: 7 all negative ? ?CHEMOTHERAPY: did not require ? ?RADIATION:completed ? ?HORMONE TREATMENT: currently on Anastrozole ? ?INFECTIONS: none ? ?LYMPHEDEMA ASSESSMENTS:  ? ?[place] RIGHT  12/05/2021  ?10 cm proximal to olecranon process 31.5  ?Olecranon process 26.5  ?10 cm proximal to ulnar styloid process 21.3  ?Just proximal to ulnar styloid process 17  ?Across hand at thumb web space 18.9  ?At base of 2nd digit 6.4  ?(  Blank rows = not tested) ? ?[place] LEFT  12/05/2021  ?10 cm proximal to olecranon process 31.3  ?Olecranon process 27  ?10 cm proximal to ulnar styloid process 20.5  ?Just proximal to ulnar styloid process 17.4  ?Across hand at thumb web space 18.5  ?At base of 2nd digit 6.5  ?(Blank rows = not tested) ? ? ? ? ?QUICK DASH SURVEY:  ? [name] - 12/05/21 0001   ? ? Open a tight or new jar Moderate difficulty   ? Do heavy household chores (wash walls, wash floors) Moderate difficulty   ? Carry a shopping bag or briefcase Mild difficulty   ? Wash your back Mild difficulty   ? Use a knife to cut food No difficulty   ? Recreational activities in which you take some force or impact through your arm, shoulder, or hand (golf, hammering, tennis) Moderate difficulty   ? During the past week, to what extent has your arm, shoulder or hand problem interfered with your normal social activities with family, friends, neighbors, or groups? Not at all   ? During the past week, to what extent has your arm, shoulder or hand problem limited your work or other regular daily activities Not at all   ? Arm, shoulder, or hand pain. Moderate   ? Tingling (pins and needles) in your arm, shoulder, or hand Mild   ? Difficulty Sleeping  Moderate difficulty   ? DASH Score 29.55 %   ? ?  ?  ? ?  ? ? ? ? ?TODAY'S TREATMENT  ?Treatment not performed today due to time constraints ? ?PATIENT EDUCATION:  ?Education details: importance of upright po

## 2021-12-06 ENCOUNTER — Ambulatory Visit: Payer: Medicare Other | Admitting: Physical Therapy

## 2021-12-06 ENCOUNTER — Encounter: Payer: Self-pay | Admitting: Physical Therapy

## 2021-12-06 DIAGNOSIS — Z170 Estrogen receptor positive status [ER+]: Secondary | ICD-10-CM

## 2021-12-06 DIAGNOSIS — R293 Abnormal posture: Secondary | ICD-10-CM

## 2021-12-06 DIAGNOSIS — M25611 Stiffness of right shoulder, not elsewhere classified: Secondary | ICD-10-CM | POA: Diagnosis not present

## 2021-12-06 DIAGNOSIS — M25511 Pain in right shoulder: Secondary | ICD-10-CM

## 2021-12-06 DIAGNOSIS — M6281 Muscle weakness (generalized): Secondary | ICD-10-CM | POA: Diagnosis not present

## 2021-12-06 DIAGNOSIS — C50411 Malignant neoplasm of upper-outer quadrant of right female breast: Secondary | ICD-10-CM | POA: Diagnosis not present

## 2021-12-06 DIAGNOSIS — M79621 Pain in right upper arm: Secondary | ICD-10-CM

## 2021-12-06 NOTE — Therapy (Signed)
?OUTPATIENT PHYSICAL THERAPY TREATMENT NOTE ? ? ?Patient Name: Cheyenne Lee ?MRN: [id number] ?DOB:1941/07/13, 81 y.o., female ?Today's Date: 12/06/2021 ? ?PCP: [name], MD ?REFERRING PROVIDER: Deanne Coffer* ? ? PT End of Session - 12/06/21 1356   ? ? Visit Number 2   ? Number of Visits 9   ? Date for PT Re-Evaluation 01/02/22   ? PT Start Time 2671   ? PT Stop Time 1450   ? PT Time Calculation (min) 53 min   ? Activity Tolerance Patient tolerated treatment well   ? Behavior During Therapy [facility] for tasks assessed/performed   ? ?  ?  ? ?  ? ? ?Past Medical History:  ?Diagnosis Date  ? Arthritis   ? hips  ? Breast cancer ([place]) 06/25/2017  ? Right breast  ? Cancer ([place])   ? right breast DCIS  ? Complication of anesthesia   ? Family history of breast cancer   ? Family history of colon cancer   ? Family history of malignant neoplasm of breast   ? Glaucoma   ? H/O seasonal allergies   ? Hypertension   ? IBS (irritable bowel syndrome)   ? Personal history of radiation therapy   ? PONV (postoperative nausea and vomiting)   ? ?Past Surgical History:  ?Procedure Laterality Date  ? AXILLARY LYMPH NODE DISSECTION Right 07/29/2017  ? Procedure: AXILLARY LYMPH NODE DISSECTION;  Surgeon: Donnie Mesa, MD;  Location: [place];  Service: General;  Laterality: Right;  ? AXILLARY SENTINEL NODE BIOPSY Right 07/29/2017  ? Procedure: RIGHT AXILLARY SENTINEL NODE BIOPSY;  Surgeon: Donnie Mesa, MD;  Location: [place];  Service: General;  Laterality: Right;  ? BREAST BIOPSY    ? Multiple Biopsies, both breasts  ? BREAST LUMPECTOMY Right 2018  ? BREAST LUMPECTOMY WITH RADIOACTIVE SEED LOCALIZATION Right 07/15/2017  ? Procedure: RIGHT BREAST LUMPECTOMY WITH RADIOACTIVE SEED LOCALIZATION ERAS PATHWAY;  Surgeon: Donnie Mesa, MD;  Location: [place];  Service: General;  Laterality: Right;  LMA  ? CESAREAN SECTION    ? x4  ? ?Patient Active Problem List  ? Diagnosis Date  Noted  ? Genetic testing 09/30/2017  ? Family history of colon cancer   ? Family history of breast cancer   ? Malignant neoplasm of upper-outer quadrant of right breast in female, estrogen receptor positive ([place]) 08/12/2017  ? Family history of malignant neoplasm of breast   ? HYPERLIPIDEMIA 10/21/2008  ? OBESITY 10/21/2008  ? HYPERTENSION 10/21/2008  ? ALLERGIC RHINITIS 10/21/2008  ? DIVERTICULOSIS, COLON 10/21/2008  ? FEVER UNSPECIFIED 10/14/2008  ? OTHER ABNORMAL BLOOD CHEMISTRY 10/14/2008  ? ? ?REFERRING DIAG: C50.411,Z17.0 (ICD-10-CM) - Malignant neoplasm of upper-outer quadrant of right breast in female, estrogen receptor positive ([place])  ? ?THERAPY DIAG:  ?Acute pain of right shoulder ? ?Pain in right upper arm ? ?Stiffness of right shoulder, not elsewhere classified ? ?Muscle weakness (generalized) ? ?Abnormal posture ? ?Malignant neoplasm of upper-outer quadrant of right breast in female, estrogen receptor positive ([place]) ? ?PERTINENT HISTORY: 07/15/2017 ?Right breast cancer, Right lumpectomy: Grade 2 IDC with DCIS, margins negative, invasive tumor 0.8 cm the remainder was DCIS, 0/7 lymph nodes negative, ER 100%, PR 95%, Ki-67 10%, HER-2 negative ratio 1.43, T1b N0 stage I a; Radiation Therapy 09/08/2017 - 10/06/2017, Anti-estrogen oral therapy 11/06/17 - present, Anastrozole 1 mg daily, had a bad fall on 11/13/21 which required staples in her head (was standing on chair trying to reach something  up high) ? ?PRECAUTIONS: Other: at risk for lymphedema in LUE ? ?SUBJECTIVE: My shoulder is feeling good today.  ? ?PAIN:  ?Are you having pain? Yes: NPRS scale: 2/10 ?Pain location: R shoulder ?Pain description: pulling, ache ?Aggravating factors: laying on it ?Relieving factors: nothing ?OBJECTIVE ?  ?COGNITION: ?           Overall cognitive status: Within functional limits for tasks assessed  ?  ?PALPATION: Pt has tender palpable bumps in her upper arm that feel similar to lipomas ?  ?OBSERVATIONS / OTHER  ASSESSMENTS: increased scar tissue palpable at scar line on R, increased muscle tension throughout R upper quadrant ?  ?  ?POSTURE: rounded shoulders, forward head ?  ?UPPER EXTREMITY AROM/PROM: ?  ?A/PROM RIGHT  12/05/2021 ?  RIGHT 12/06/21 (following STM)  ?Shoulder extension 50 52  ?Shoulder flexion 158 (discomfort in superior shoulder) 160 - no pain today  ?Shoulder abduction 155 (discomfort in superior shoulder) 163 (no pain)  ?Shoulder internal rotation 26 with P! 20 with P!  ?Shoulder external rotation 90   ?                        (Blank rows = not tested) ?  ?A/PROM LEFT  12/05/2021  ?Shoulder extension 45  ?Shoulder flexion 170  ?Shoulder abduction 160  ?Shoulder internal rotation 49  ?Shoulder external rotation 84  ?                        (Blank rows = not tested) ?  ?  ?  ?LYMPHEDEMA ASSESSMENTS:  ?  ?SURGERY TYPE/DATE: 07/15/22 R lumpectomy and SLNB (had 2 surgeries - first was lumpectomy and 2nd was SLNB) ?  ?NUMBER OF LYMPH NODES REMOVED: 7 all negative ?  ?CHEMOTHERAPY: did not require ?  ?RADIATION:completed ?  ?HORMONE TREATMENT: currently on Anastrozole ?  ?INFECTIONS: none ?  ?LYMPHEDEMA ASSESSMENTS:  ?  ?[place] RIGHT  12/05/2021  ?10 cm proximal to olecranon process 31.5  ?Olecranon process 26.5  ?10 cm proximal to ulnar styloid process 21.3  ?Just proximal to ulnar styloid process 17  ?Across hand at thumb web space 18.9  ?At base of 2nd digit 6.4  ?(Blank rows = not tested) ?  ?[place] LEFT  12/05/2021  ?10 cm proximal to olecranon process 31.3  ?Olecranon process 27  ?10 cm proximal to ulnar styloid process 20.5  ?Just proximal to ulnar styloid process 17.4  ?Across hand at thumb web space 18.5  ?At base of 2nd digit 6.5  ?(Blank rows = not tested) ?  ?  ?  ?  ?QUICK DASH SURVEY:  ?  [name] - 12/05/21 0001   ?  ?  Open a tight or new jar Moderate difficulty   ?  Do heavy household chores (wash walls, wash floors) Moderate difficulty   ?  Carry a shopping bag or briefcase Mild difficulty    ?  Wash your back Mild difficulty   ?  Use a knife to cut food No difficulty   ?  Recreational activities in which you take some force or impact through your arm, shoulder, or hand (golf, hammering, tennis) Moderate difficulty   ?  During the past week, to what extent has your arm, shoulder or hand problem interfered with your normal social activities with family, friends, neighbors, or groups? Not at all   ?  During the past week, to what extent has your arm,  shoulder or hand problem limited your work or other regular daily activities Not at all   ?  Arm, shoulder, or hand pain. Moderate   ?  Tingling (pins and needles) in your arm, shoulder, or hand Mild   ?  Difficulty Sleeping Moderate difficulty   ?  DASH Score 29.55 %   ?  ?   ?  ?  ?   ?  ?  ?  ?  ?TODAY'S TREATMENT  ? ?12/06/21- ?Manual therapy: STM in L sidelying to R lats, teres major/minor, insertion of rotator cuff, rhomboids, upper traps and levator using cocoa butter with numerous areas of muscle tightness noted that released today with therapy ?R shoulder isometrics: pt returned therapist demo x 5 reps with 5 sec holds in direction of flexion, extension, IR and ER  ?PATIENT EDUCATION:  ?Education details: importance of upright posture and keeping shoulder relaxed to decrease pain and muscle tension ?Person educated: Patient ?Education method: Explanation ?Education comprehension: verbalized understanding ?  ?  ?HOME EXERCISE PROGRAM: ?            Work on upright posture with shoulders back and relaxed ? Access Code: [email or web address] ?URL: [email or web address] ?Date: 12/06/2021 ?Prepared by: Manus Gunning ? ?Exercises ?Standing Isometric Shoulder Internal Rotation at Doorway - 1 x daily - 7 x weekly - 1 sets - 5-10 reps - 5 sec hold ?Isometric Shoulder Flexion at Wall - 1 x daily - 7 x weekly - 1 sets - 5-10 reps - 5 sec hold ?Standing Isometric Shoulder External Rotation with Doorway - 1 x daily - 7 x weekly - 1 sets - 5-10 reps - 5 sec  hold ?Standing Isometric Shoulder Extension with Doorway - Arm Bent - 1 x daily - 7 x weekly - 1 sets - 5-10 reps - 5 sec hold ? ?  ?ASSESSMENT: ?  ?CLINICAL IMPRESSION: ?Pt returns to therapy today. Began sessi

## 2021-12-10 ENCOUNTER — Other Ambulatory Visit: Payer: Self-pay

## 2021-12-10 ENCOUNTER — Ambulatory Visit: Payer: Medicare Other | Admitting: Physical Therapy

## 2021-12-10 ENCOUNTER — Encounter: Payer: Self-pay | Admitting: Physical Therapy

## 2021-12-10 DIAGNOSIS — C50411 Malignant neoplasm of upper-outer quadrant of right female breast: Secondary | ICD-10-CM | POA: Diagnosis not present

## 2021-12-10 DIAGNOSIS — M25611 Stiffness of right shoulder, not elsewhere classified: Secondary | ICD-10-CM | POA: Diagnosis not present

## 2021-12-10 DIAGNOSIS — M25511 Pain in right shoulder: Secondary | ICD-10-CM

## 2021-12-10 DIAGNOSIS — M79621 Pain in right upper arm: Secondary | ICD-10-CM

## 2021-12-10 DIAGNOSIS — R293 Abnormal posture: Secondary | ICD-10-CM | POA: Diagnosis not present

## 2021-12-10 DIAGNOSIS — M6281 Muscle weakness (generalized): Secondary | ICD-10-CM

## 2021-12-10 DIAGNOSIS — Z170 Estrogen receptor positive status [ER+]: Secondary | ICD-10-CM

## 2021-12-10 NOTE — Therapy (Signed)
?OUTPATIENT PHYSICAL THERAPY TREATMENT NOTE ? ? ?Patient Name: Cheyenne Lee ?MRN: [id number] ?DOB:1940/12/18, 81 y.o., female ?Today's Date: 12/10/2021 ? ?PCP: [name], MD ?REFERRING PROVIDER: Deanne Coffer* ? ? PT End of Session - 12/10/21 1456   ? ? Visit Number 3   ? Number of Visits 9   ? Date for PT Re-Evaluation 01/02/22   ? PT Start Time 1405   ? PT Stop Time 1451   ? PT Time Calculation (min) 46 min   ? Activity Tolerance Patient tolerated treatment well   ? Behavior During Therapy [facility] for tasks assessed/performed   ? ?  ?  ? ?  ? ? ? ?Past Medical History:  ?Diagnosis Date  ? Arthritis   ? hips  ? Breast cancer ([place]) 06/25/2017  ? Right breast  ? Cancer ([place])   ? right breast DCIS  ? Complication of anesthesia   ? Family history of breast cancer   ? Family history of colon cancer   ? Family history of malignant neoplasm of breast   ? Glaucoma   ? H/O seasonal allergies   ? Hypertension   ? IBS (irritable bowel syndrome)   ? Personal history of radiation therapy   ? PONV (postoperative nausea and vomiting)   ? ?Past Surgical History:  ?Procedure Laterality Date  ? AXILLARY LYMPH NODE DISSECTION Right 07/29/2017  ? Procedure: AXILLARY LYMPH NODE DISSECTION;  Surgeon: Donnie Mesa, MD;  Location: [place];  Service: General;  Laterality: Right;  ? AXILLARY SENTINEL NODE BIOPSY Right 07/29/2017  ? Procedure: RIGHT AXILLARY SENTINEL NODE BIOPSY;  Surgeon: Donnie Mesa, MD;  Location: [place];  Service: General;  Laterality: Right;  ? BREAST BIOPSY    ? Multiple Biopsies, both breasts  ? BREAST LUMPECTOMY Right 2018  ? BREAST LUMPECTOMY WITH RADIOACTIVE SEED LOCALIZATION Right 07/15/2017  ? Procedure: RIGHT BREAST LUMPECTOMY WITH RADIOACTIVE SEED LOCALIZATION ERAS PATHWAY;  Surgeon: Donnie Mesa, MD;  Location: [place];  Service: General;  Laterality: Right;  LMA  ? CESAREAN SECTION    ? x4  ? ?Patient Active Problem List  ? Diagnosis  Date Noted  ? Genetic testing 09/30/2017  ? Family history of colon cancer   ? Family history of breast cancer   ? Malignant neoplasm of upper-outer quadrant of right breast in female, estrogen receptor positive ([place]) 08/12/2017  ? Family history of malignant neoplasm of breast   ? HYPERLIPIDEMIA 10/21/2008  ? OBESITY 10/21/2008  ? HYPERTENSION 10/21/2008  ? ALLERGIC RHINITIS 10/21/2008  ? DIVERTICULOSIS, COLON 10/21/2008  ? FEVER UNSPECIFIED 10/14/2008  ? OTHER ABNORMAL BLOOD CHEMISTRY 10/14/2008  ? ? ?REFERRING DIAG: C50.411,Z17.0 (ICD-10-CM) - Malignant neoplasm of upper-outer quadrant of right breast in female, estrogen receptor positive ([place])  ? ?THERAPY DIAG:  ?Acute pain of right shoulder ? ?Pain in right upper arm ? ?Stiffness of right shoulder, not elsewhere classified ? ?Muscle weakness (generalized) ? ?Abnormal posture ? ?Malignant neoplasm of upper-outer quadrant of right breast in female, estrogen receptor positive ([place]) ? ?PERTINENT HISTORY: 07/15/2017 ?Right breast cancer, Right lumpectomy: Grade 2 IDC with DCIS, margins negative, invasive tumor 0.8 cm the remainder was DCIS, 0/7 lymph nodes negative, ER 100%, PR 95%, Ki-67 10%, HER-2 negative ratio 1.43, T1b N0 stage I a; Radiation Therapy 09/08/2017 - 10/06/2017, Anti-estrogen oral therapy 11/06/17 - present, Anastrozole 1 mg daily, had a bad fall on 11/13/21 which required staples in her head (was standing on chair trying to reach  something up high) ? ?PRECAUTIONS: Other: at risk for lymphedema in LUE ? ?SUBJECTIVE:The shoulder is doing good. I had a good night after coming here. I have been doing the isometric exercises and I have been trying to watch my posture.  ? ?PAIN:  ?Are you having pain? No ?OBJECTIVE ?  ?COGNITION: ?           Overall cognitive status: Within functional limits for tasks assessed  ?  ?PALPATION: Pt has tender palpable bumps in her upper arm that feel similar to lipomas ?  ?OBSERVATIONS / OTHER ASSESSMENTS: increased scar  tissue palpable at scar line on R, increased muscle tension throughout R upper quadrant ?  ?  ?POSTURE: rounded shoulders, forward head ?  ?UPPER EXTREMITY AROM/PROM: ?  ?A/PROM RIGHT  12/05/2021 ?  RIGHT 12/06/21 (following STM) RIGHT 12/10/21 (prior to therapy)  ?Shoulder extension 50 52 56  ?Shoulder flexion 158 (discomfort in superior shoulder) 160 - no pain today 160 no pain  ?Shoulder abduction 155 (discomfort in superior shoulder) 163 (no pain) 172 (mild pain)  ?Shoulder internal rotation 26 with P! 20 with P! 15 with p!  ?Shoulder external rotation 90    ?                        (Blank rows = not tested) ?  ?A/PROM LEFT  12/05/2021  ?Shoulder extension 45  ?Shoulder flexion 170  ?Shoulder abduction 160  ?Shoulder internal rotation 49  ?Shoulder external rotation 84  ?                        (Blank rows = not tested) ?  ?  ?  ?LYMPHEDEMA ASSESSMENTS:  ?  ?SURGERY TYPE/DATE: 07/15/22 R lumpectomy and SLNB (had 2 surgeries - first was lumpectomy and 2nd was SLNB) ?  ?NUMBER OF LYMPH NODES REMOVED: 7 all negative ?  ?CHEMOTHERAPY: did not require ?  ?RADIATION:completed ?  ?HORMONE TREATMENT: currently on Anastrozole ?  ?INFECTIONS: none ?  ?LYMPHEDEMA ASSESSMENTS:  ?  ?[place] RIGHT  12/05/2021  ?10 cm proximal to olecranon process 31.5  ?Olecranon process 26.5  ?10 cm proximal to ulnar styloid process 21.3  ?Just proximal to ulnar styloid process 17  ?Across hand at thumb web space 18.9  ?At base of 2nd digit 6.4  ?(Blank rows = not tested) ?  ?[place] LEFT  12/05/2021  ?10 cm proximal to olecranon process 31.3  ?Olecranon process 27  ?10 cm proximal to ulnar styloid process 20.5  ?Just proximal to ulnar styloid process 17.4  ?Across hand at thumb web space 18.5  ?At base of 2nd digit 6.5  ?(Blank rows = not tested) ?  ?  ?  ?  ?QUICK DASH SURVEY:  ?  [name] - 12/05/21 0001   ?  ?  Open a tight or new jar Moderate difficulty   ?  Do heavy household chores (wash walls, wash floors) Moderate difficulty   ?   Carry a shopping bag or briefcase Mild difficulty   ?  Wash your back Mild difficulty   ?  Use a knife to cut food No difficulty   ?  Recreational activities in which you take some force or impact through your arm, shoulder, or hand (golf, hammering, tennis) Moderate difficulty   ?  During the past week, to what extent has your arm, shoulder or hand problem interfered with your normal social activities with family, friends,  neighbors, or groups? Not at all   ?  During the past week, to what extent has your arm, shoulder or hand problem limited your work or other regular daily activities Not at all   ?  Arm, shoulder, or hand pain. Moderate   ?  Tingling (pins and needles) in your arm, shoulder, or hand Mild   ?  Difficulty Sleeping Moderate difficulty   ?  DASH Score 29.55 %   ?  ?   ?  ?  ?   ?  ?  ?  ?  ?TODAY'S TREATMENT  ? ?12/10/21- ?Manual therapy: STM in L sidelying to R lats, teres major/minor, insertion of rotator cuff, rhomboids, upper traps and levator using cocoa butter with improvements noted today in muscle tightness ?R shoulder isometrics: pt returned therapist demo x 5 reps with 5 sec holds in direction of flexion, extension, IR and ER  ?R shoulder walkouts in the following directions using yellow theraband: flexion, extension, IR and ER with v/c to keep arm stable throughout with pt having increased difficulty with ER keeping elbow at side ?R shoulder ER with yellow band attached to door knob x 10 reps ?R shoulder retraction with yellow band x 10 reps each ? ?12/06/21- ?Manual therapy: STM in L sidelying to R lats, teres major/minor, insertion of rotator cuff, rhomboids, upper traps and levator using cocoa butter with numerous areas of muscle tightness noted that released today with therapy ?R shoulder isometrics: pt returned therapist demo x 5 reps with 5 sec holds in direction of flexion, extension, IR and ER  ?PATIENT EDUCATION:  ?Education details: importance of upright posture and keeping  shoulder relaxed to decrease pain and muscle tension ?Person educated: Patient ?Education method: Explanation ?Education comprehension: verbalized understanding ?  ?  ?HOME EXERCISE PROGRAM: ?            Work on [employer]

## 2021-12-13 ENCOUNTER — Other Ambulatory Visit: Payer: Self-pay

## 2021-12-13 ENCOUNTER — Ambulatory Visit: Payer: Medicare Other | Admitting: Physical Therapy

## 2021-12-13 ENCOUNTER — Encounter: Payer: Self-pay | Admitting: Physical Therapy

## 2021-12-13 DIAGNOSIS — M6281 Muscle weakness (generalized): Secondary | ICD-10-CM | POA: Diagnosis not present

## 2021-12-13 DIAGNOSIS — R293 Abnormal posture: Secondary | ICD-10-CM | POA: Diagnosis not present

## 2021-12-13 DIAGNOSIS — M25511 Pain in right shoulder: Secondary | ICD-10-CM

## 2021-12-13 DIAGNOSIS — M79621 Pain in right upper arm: Secondary | ICD-10-CM

## 2021-12-13 DIAGNOSIS — M25611 Stiffness of right shoulder, not elsewhere classified: Secondary | ICD-10-CM | POA: Diagnosis not present

## 2021-12-13 DIAGNOSIS — C50411 Malignant neoplasm of upper-outer quadrant of right female breast: Secondary | ICD-10-CM | POA: Diagnosis not present

## 2021-12-13 NOTE — Patient Instructions (Signed)
Over Head Pull: Narrow and Wide Grip   Cancer Rehab [id number] ? ? ?On back, knees bent, feet flat, band across thighs, elbows straight but relaxed. Pull hands apart (start). Keeping elbows straight, bring arms up and over head, hands toward floor. Keep pull steady on band. Hold momentarily. Return slowly, keeping pull steady, back to start. Then do same with a wider grip on the band (past shoulder width) ?Repeat _10__ times. Band color __yellow____  ? ?Side Pull: Double Arm ? ? ?On back, knees bent, feet flat. Arms perpendicular to body, shoulder level, elbows straight but relaxed. Pull arms out to sides, elbows straight. Resistance band comes across collarbones, hands toward floor. Hold momentarily. Slowly return to starting position. Repeat _10__ times. Band color _yellow____  ? ?Sword ? ? ?On back, knees bent, feet flat, left hand on left hip, right hand above left. Pull right arm DIAGONALLY (hip to shoulder) across chest. Bring right arm along head toward floor. Thumb is pointed down when by hip and rotates backwards when by head. Hold momentarily. Slowly return to starting position. ?Repeat _10__ times. Do with left arm. Band color _yellow_____  ? ?Shoulder Rotation: Double Arm ? ? ?On back, knees bent, feet flat, elbows tucked at sides, bent 90?, hands palms up. Pull hands apart and down toward floor, keeping elbows near sides. Hold momentarily. Slowly return to starting position. ?Repeat _10__ times. Band color __yellow____  ? ? ?

## 2021-12-13 NOTE — Therapy (Signed)
?OUTPATIENT PHYSICAL THERAPY TREATMENT NOTE ? ? ?Patient Name: Cheyenne Lee ?MRN: [id number] ?DOB:July 24, 1941, 81 y.o., female ?Today's Date: 12/13/2021 ? ?PCP: Cheyenne Amel, MD ?REFERRING PROVIDER: Deanne Lee* ? ? PT End of Session - 12/13/21 1358   ? ? Visit Number 4   ? Number of Visits 9   ? Date for PT Re-Evaluation 01/02/22   ? PT Start Time 1356   ? PT Stop Time 1435   pt requested to end session early  ? PT Time Calculation (min) 39 min   ? Activity Tolerance Patient tolerated treatment well   ? Behavior During Therapy [facility] for tasks assessed/performed   ? ?  ?  ? ?  ? ? ? ?Past Medical History:  ?Diagnosis Date  ? Arthritis   ? hips  ? Breast cancer ([place]) 06/25/2017  ? Right breast  ? Cancer ([place])   ? right breast DCIS  ? Complication of anesthesia   ? Family history of breast cancer   ? Family history of colon cancer   ? Family history of malignant neoplasm of breast   ? Glaucoma   ? H/O seasonal allergies   ? Hypertension   ? IBS (irritable bowel syndrome)   ? Personal history of radiation therapy   ? PONV (postoperative nausea and vomiting)   ? ?Past Surgical History:  ?Procedure Laterality Date  ? AXILLARY LYMPH NODE DISSECTION Right 07/29/2017  ? Procedure: AXILLARY LYMPH NODE DISSECTION;  Surgeon: Cheyenne Mesa, MD;  Location: [place];  Service: General;  Laterality: Right;  ? AXILLARY SENTINEL NODE BIOPSY Right 07/29/2017  ? Procedure: RIGHT AXILLARY SENTINEL NODE BIOPSY;  Surgeon: Cheyenne Mesa, MD;  Location: [place];  Service: General;  Laterality: Right;  ? BREAST BIOPSY    ? Multiple Biopsies, both breasts  ? BREAST LUMPECTOMY Right 2018  ? BREAST LUMPECTOMY WITH RADIOACTIVE SEED LOCALIZATION Right 07/15/2017  ? Procedure: RIGHT BREAST LUMPECTOMY WITH RADIOACTIVE SEED LOCALIZATION ERAS PATHWAY;  Surgeon: Cheyenne Mesa, MD;  Location: [place];  Service: General;  Laterality: Right;  LMA  ? CESAREAN SECTION    ? x4  ? ?Patient  Active Problem List  ? Diagnosis Date Noted  ? Genetic testing 09/30/2017  ? Family history of colon cancer   ? Family history of breast cancer   ? Malignant neoplasm of upper-outer quadrant of right breast in female, estrogen receptor positive ([place]) 08/12/2017  ? Family history of malignant neoplasm of breast   ? HYPERLIPIDEMIA 10/21/2008  ? OBESITY 10/21/2008  ? HYPERTENSION 10/21/2008  ? ALLERGIC RHINITIS 10/21/2008  ? DIVERTICULOSIS, COLON 10/21/2008  ? FEVER UNSPECIFIED 10/14/2008  ? OTHER ABNORMAL BLOOD CHEMISTRY 10/14/2008  ? ? ?REFERRING DIAG: C50.411,Z17.0 (ICD-10-CM) - Malignant neoplasm of upper-outer quadrant of right breast in female, estrogen receptor positive ([place])  ? ?THERAPY DIAG:  ?Acute pain of right shoulder ? ?Pain in right upper arm ? ?Stiffness of right shoulder, not elsewhere classified ? ?Muscle weakness (generalized) ? ?Abnormal posture ? ?Malignant neoplasm of upper-outer quadrant of right breast in female, estrogen receptor positive ([place]) ? ?PERTINENT HISTORY: 07/15/2017 ?Right breast cancer, Right lumpectomy: Grade 2 IDC with DCIS, margins negative, invasive tumor 0.8 cm the remainder was DCIS, 0/7 lymph nodes negative, ER 100%, PR 95%, Ki-67 10%, HER-2 negative ratio 1.43, T1b N0 stage I a; Radiation Therapy 09/08/2017 - 10/06/2017, Anti-estrogen oral therapy 11/06/17 - present, Anastrozole 1 mg daily, had a bad fall on 11/13/21 which required staples in her head (  was standing on chair trying to reach something up high) ? ?PRECAUTIONS: Other: at risk for lymphedema in LUE ? ?SUBJECTIVE: The arm is feeling really good. I am able to sleep on that side now with no problem. I have been practicing the exercises.   ? ?PAIN:  ?Are you having pain? No ?OBJECTIVE ?  ?COGNITION: ?           Overall cognitive status: Within functional limits for tasks assessed  ?  ?PALPATION: Pt has tender palpable bumps in her upper arm that feel similar to lipomas ?  ?OBSERVATIONS / OTHER ASSESSMENTS: increased  scar tissue palpable at scar line on R, increased muscle tension throughout R upper quadrant ?  ?  ?POSTURE: rounded shoulders, forward head ?  ?UPPER EXTREMITY AROM/PROM: ?  ?A/PROM RIGHT  12/05/2021 ?  RIGHT 12/06/21 (following STM) RIGHT 12/10/21 (prior to therapy)  ?Shoulder extension 50 52 56  ?Shoulder flexion 158 (discomfort in superior shoulder) 160 - no pain today 160 no pain  ?Shoulder abduction 155 (discomfort in superior shoulder) 163 (no pain) 172 (mild pain)  ?Shoulder internal rotation 26 with P! 20 with P! 15 with p!  ?Shoulder external rotation 90    ?                        (Blank rows = not tested) ?  ?A/PROM LEFT  12/05/2021  ?Shoulder extension 45  ?Shoulder flexion 170  ?Shoulder abduction 160  ?Shoulder internal rotation 49  ?Shoulder external rotation 84  ?                        (Blank rows = not tested) ?  ?  ?  ?LYMPHEDEMA ASSESSMENTS:  ?  ?SURGERY TYPE/DATE: 07/15/22 R lumpectomy and SLNB (had 2 surgeries - first was lumpectomy and 2nd was SLNB) ?  ?NUMBER OF LYMPH NODES REMOVED: 7 all negative ?  ?CHEMOTHERAPY: did not require ?  ?RADIATION:completed ?  ?HORMONE TREATMENT: currently on Anastrozole ?  ?INFECTIONS: none ?  ?LYMPHEDEMA ASSESSMENTS:  ?  ?[place] RIGHT  12/05/2021  ?10 cm proximal to olecranon process 31.5  ?Olecranon process 26.5  ?10 cm proximal to ulnar styloid process 21.3  ?Just proximal to ulnar styloid process 17  ?Across hand at thumb web space 18.9  ?At base of 2nd digit 6.4  ?(Blank rows = not tested) ?  ?[place] LEFT  12/05/2021  ?10 cm proximal to olecranon process 31.3  ?Olecranon process 27  ?10 cm proximal to ulnar styloid process 20.5  ?Just proximal to ulnar styloid process 17.4  ?Across hand at thumb web space 18.5  ?At base of 2nd digit 6.5  ?(Blank rows = not tested) ?  ?  ?  ?  ?QUICK DASH SURVEY:  ?  Cheyenne Lee - 12/05/21 0001   ?  ?  Open a tight or new jar Moderate difficulty   ?  Do heavy household chores (wash walls, wash floors) Moderate difficulty   ?   Carry a shopping bag or briefcase Mild difficulty   ?  Wash your back Mild difficulty   ?  Use a knife to cut food No difficulty   ?  Recreational activities in which you take some force or impact through your arm, shoulder, or hand (golf, hammering, tennis) Moderate difficulty   ?  During the past week, to what extent has your arm, shoulder or hand problem interfered with your normal social  activities with family, friends, neighbors, or groups? Not at all   ?  During the past week, to what extent has your arm, shoulder or hand problem limited your work or other regular daily activities Not at all   ?  Arm, shoulder, or hand pain. Moderate   ?  Tingling (pins and needles) in your arm, shoulder, or hand Mild   ?  Difficulty Sleeping Moderate difficulty   ?  DASH Score 29.55 %   ?  ?   ?  ?  ?   ?  ?  ?  ?  ?TODAY'S TREATMENT  ? ?12/13/21-  ?Instructed pt today in supine scapular series using yellow theraband as follows: diagonals x 10 bilaterally with v/c for correct position of thumb and rotation of UE, horizontal abduction x 10 bilaterally, overhead flexion with narrow and wide grip x 10 each with verbal cues to keep elbows straight, and shoulder ER x 10 bilat with v/c to keep elbows tucked at sides ?R shoulder walkouts in the following directions using yellow theraband: flexion, extension, IR and ER with v/c to keep arm stable throughout with pt having increased difficulty with ER keeping elbow at side ? ?12/10/21- ?Manual therapy: STM in L sidelying to R lats, teres major/minor, insertion of rotator cuff, rhomboids, upper traps and levator using cocoa butter with improvements noted today in muscle tightness ?R shoulder isometrics: pt returned therapist demo x 5 reps with 5 sec holds in direction of flexion, extension, IR and ER  ?R shoulder walkouts in the following directions using yellow theraband: flexion, extension, IR and ER with v/c to keep arm stable throughout with pt having increased difficulty with ER  keeping elbow at side ?R shoulder ER with yellow band attached to door knob x 10 reps ?R shoulder retraction with yellow band x 10 reps each ? ?12/06/21- ?Manual therapy: STM in L sidelying to R lats, teres

## 2021-12-18 ENCOUNTER — Encounter: Payer: Self-pay | Admitting: Physical Therapy

## 2021-12-18 ENCOUNTER — Other Ambulatory Visit: Payer: Self-pay

## 2021-12-18 ENCOUNTER — Ambulatory Visit: Payer: Medicare Other | Admitting: Physical Therapy

## 2021-12-18 DIAGNOSIS — M6281 Muscle weakness (generalized): Secondary | ICD-10-CM | POA: Diagnosis not present

## 2021-12-18 DIAGNOSIS — M25611 Stiffness of right shoulder, not elsewhere classified: Secondary | ICD-10-CM | POA: Diagnosis not present

## 2021-12-18 DIAGNOSIS — M25511 Pain in right shoulder: Secondary | ICD-10-CM | POA: Diagnosis not present

## 2021-12-18 DIAGNOSIS — M79621 Pain in right upper arm: Secondary | ICD-10-CM | POA: Diagnosis not present

## 2021-12-18 DIAGNOSIS — C50411 Malignant neoplasm of upper-outer quadrant of right female breast: Secondary | ICD-10-CM | POA: Diagnosis not present

## 2021-12-18 DIAGNOSIS — R293 Abnormal posture: Secondary | ICD-10-CM | POA: Diagnosis not present

## 2021-12-18 NOTE — Therapy (Signed)
?OUTPATIENT PHYSICAL THERAPY TREATMENT NOTE ? ? ?Patient Name: Cheyenne Lee ?MRN: [id number] ?DOB:[birth year], 81 y.o., female ?Today's Date: 12/18/2021 ? ?PCP: [name], MD ?REFERRING PROVIDER: Deanne Coffer* ? ? PT End of Session - 12/18/21 1405   ? ? Visit Number 5   ? Number of Visits 9   ? Date for PT Re-Evaluation 01/02/22   ? PT Start Time 0092   ? PT Stop Time 3300   ? PT Time Calculation (min) 42 min   ? Activity Tolerance Patient tolerated treatment well   ? Behavior During Therapy [facility] for tasks assessed/performed   ? ?  ?  ? ?  ? ? ? ?Past Medical History:  ?Diagnosis Date  ? Arthritis   ? hips  ? Breast cancer ([place]) 06/25/2017  ? Right breast  ? Cancer ([place])   ? right breast DCIS  ? Complication of anesthesia   ? Family history of breast cancer   ? Family history of colon cancer   ? Family history of malignant neoplasm of breast   ? Glaucoma   ? H/O seasonal allergies   ? Hypertension   ? IBS (irritable bowel syndrome)   ? Personal history of radiation therapy   ? PONV (postoperative nausea and vomiting)   ? ?Past Surgical History:  ?Procedure Laterality Date  ? AXILLARY LYMPH NODE DISSECTION Right 07/29/2017  ? Procedure: AXILLARY LYMPH NODE DISSECTION;  Surgeon: Donnie Mesa, MD;  Location: [place];  Service: General;  Laterality: Right;  ? AXILLARY SENTINEL NODE BIOPSY Right 07/29/2017  ? Procedure: RIGHT AXILLARY SENTINEL NODE BIOPSY;  Surgeon: Donnie Mesa, MD;  Location: [place];  Service: General;  Laterality: Right;  ? BREAST BIOPSY    ? Multiple Biopsies, both breasts  ? BREAST LUMPECTOMY Right 2018  ? BREAST LUMPECTOMY WITH RADIOACTIVE SEED LOCALIZATION Right 07/15/2017  ? Procedure: RIGHT BREAST LUMPECTOMY WITH RADIOACTIVE SEED LOCALIZATION ERAS PATHWAY;  Surgeon: Donnie Mesa, MD;  Location: [place];  Service: General;  Laterality: Right;  LMA  ? CESAREAN SECTION    ? x4  ? ?Patient Active Problem List  ? Diagnosis  Date Noted  ? Genetic testing 09/30/2017  ? Family history of colon cancer   ? Family history of breast cancer   ? Malignant neoplasm of upper-outer quadrant of right breast in female, estrogen receptor positive ([place]) 08/12/2017  ? Family history of malignant neoplasm of breast   ? HYPERLIPIDEMIA 10/21/2008  ? OBESITY 10/21/2008  ? HYPERTENSION 10/21/2008  ? ALLERGIC RHINITIS 10/21/2008  ? DIVERTICULOSIS, COLON 10/21/2008  ? FEVER UNSPECIFIED 10/14/2008  ? OTHER ABNORMAL BLOOD CHEMISTRY 10/14/2008  ? ? ?REFERRING DIAG: C50.411,Z17.0 (ICD-10-CM) - Malignant neoplasm of upper-outer quadrant of right breast in female, estrogen receptor positive ([place])  ? ?THERAPY DIAG:  ?Acute pain of right shoulder ? ?Pain in right upper arm ? ?Stiffness of right shoulder, not elsewhere classified ? ?Muscle weakness (generalized) ? ?Abnormal posture ? ?Malignant neoplasm of upper-outer quadrant of right breast in female, estrogen receptor positive ([place]) ? ?PERTINENT HISTORY: 07/15/2017 ?Right breast cancer, Right lumpectomy: Grade 2 IDC with DCIS, margins negative, invasive tumor 0.8 cm the remainder was DCIS, 0/7 lymph nodes negative, ER 100%, PR 95%, Ki-67 10%, HER-2 negative ratio 1.43, T1b N0 stage I a; Radiation Therapy 09/08/2017 - 10/06/2017, Anti-estrogen oral therapy 11/06/17 - present, Anastrozole 1 mg daily, had a bad fall on 11/13/21 which required staples in her head (was standing on chair trying to reach  something up high) ? ?PRECAUTIONS: Other: at risk for lymphedema in LUE ? ?SUBJECTIVE: I had some soreness after last session but you are going to see that my ROM is better.  ? ?PAIN:  ?Are you having pain? No ?OBJECTIVE ?  ?COGNITION: ?           Overall cognitive status: Within functional limits for tasks assessed  ?  ?PALPATION: Pt has tender palpable bumps in her upper arm that feel similar to lipomas ?  ?OBSERVATIONS / OTHER ASSESSMENTS: increased scar tissue palpable at scar line on R, increased muscle tension  throughout R upper quadrant ?  ?  ?POSTURE: rounded shoulders, forward head ?  ?UPPER EXTREMITY AROM/PROM: ?  ?A/PROM RIGHT  12/05/2021 ?  RIGHT 12/06/21 (following STM) RIGHT 12/10/21 (prior to therapy) RIGHT 12/18/21  ?Shoulder extension 50 52 56   ?Shoulder flexion 158 (discomfort in superior shoulder) 160 - no pain today 160 no pain 165 no pain  ?Shoulder abduction 155 (discomfort in superior shoulder) 163 (no pain) 172 (mild pain) 165 no pain  ?Shoulder internal rotation 26 with P! 20 with P! 15 with p! 16 with pain in upper arm, 25 with decreased pain after scapular mobilization  ?Shoulder external rotation 90     ?                        (Blank rows = not tested) ?  ?A/PROM LEFT  12/05/2021  ?Shoulder extension 45  ?Shoulder flexion 170  ?Shoulder abduction 160  ?Shoulder internal rotation 49  ?Shoulder external rotation 84  ?                        (Blank rows = not tested) ?  ?  ?  ?LYMPHEDEMA ASSESSMENTS:  ?  ?SURGERY TYPE/DATE: 07/15/22 R lumpectomy and SLNB (had 2 surgeries - first was lumpectomy and 2nd was SLNB) ?  ?NUMBER OF LYMPH NODES REMOVED: 7 all negative ?  ?CHEMOTHERAPY: did not require ?  ?RADIATION:completed ?  ?HORMONE TREATMENT: currently on Anastrozole ?  ?INFECTIONS: none ?  ?LYMPHEDEMA ASSESSMENTS:  ?  ?[place] RIGHT  12/05/2021  ?10 cm proximal to olecranon process 31.5  ?Olecranon process 26.5  ?10 cm proximal to ulnar styloid process 21.3  ?Just proximal to ulnar styloid process 17  ?Across hand at thumb web space 18.9  ?At base of 2nd digit 6.4  ?(Blank rows = not tested) ?  ?[place] LEFT  12/05/2021  ?10 cm proximal to olecranon process 31.3  ?Olecranon process 27  ?10 cm proximal to ulnar styloid process 20.5  ?Just proximal to ulnar styloid process 17.4  ?Across hand at thumb web space 18.5  ?At base of 2nd digit 6.5  ?(Blank rows = not tested) ?  ?  ?  ?  ?QUICK DASH SURVEY:  ?  [name] - 12/05/21 0001   ?  ?  Open a tight or new jar Moderate difficulty   ?  Do heavy household  chores (wash walls, wash floors) Moderate difficulty   ?  Carry a shopping bag or briefcase Mild difficulty   ?  Wash your back Mild difficulty   ?  Use a knife to cut food No difficulty   ?  Recreational activities in which you take some force or impact through your arm, shoulder, or hand (golf, hammering, tennis) Moderate difficulty   ?  During the past week, to what extent has your arm,  shoulder or hand problem interfered with your normal social activities with family, friends, neighbors, or groups? Not at all   ?  During the past week, to what extent has your arm, shoulder or hand problem limited your work or other regular daily activities Not at all   ?  Arm, shoulder, or hand pain. Moderate   ?  Tingling (pins and needles) in your arm, shoulder, or hand Mild   ?  Difficulty Sleeping Moderate difficulty   ?  DASH Score 29.55 %   ?  ?   ?  ?  ?   ?  ?  ?  ?  ?TODAY'S TREATMENT  ? ?12/18/21- ?Scapular mobilization: in L sidelying to R scapula in to protraction/retraction and elevation/depression with barely any mobility noted at first but improved greatly by end of session ?PROM: to R shoulder in to flexion and IR  ?STM: in L sidelying to R rhomboids, lateral edge of lats, upper traps and levator with numerous trigger points noted that improved with STM ? ?12/13/21-  ?Instructed pt today in supine scapular series using yellow theraband as follows: diagonals x 10 bilaterally with v/c for correct position of thumb and rotation of UE, horizontal abduction x 10 bilaterally, overhead flexion with narrow and wide grip x 10 each with verbal cues to keep elbows straight, and shoulder ER x 10 bilat with v/c to keep elbows tucked at sides ?R shoulder walkouts in the following directions using yellow theraband: flexion, extension, IR and ER with v/c to keep arm stable throughout with pt having increased difficulty with ER keeping elbow at side ? ?12/10/21- ?Manual therapy: STM in L sidelying to R lats, teres major/minor,  insertion of rotator cuff, rhomboids, upper traps and levator using cocoa butter with improvements noted today in muscle tightness ?R shoulder isometrics: pt returned therapist demo x 5 reps with 5 sec holds in direction

## 2021-12-20 ENCOUNTER — Encounter: Payer: Self-pay | Admitting: Physical Therapy

## 2021-12-20 ENCOUNTER — Ambulatory Visit: Payer: Medicare Other | Admitting: Physical Therapy

## 2021-12-20 DIAGNOSIS — M25511 Pain in right shoulder: Secondary | ICD-10-CM | POA: Diagnosis not present

## 2021-12-20 DIAGNOSIS — R293 Abnormal posture: Secondary | ICD-10-CM

## 2021-12-20 DIAGNOSIS — M25611 Stiffness of right shoulder, not elsewhere classified: Secondary | ICD-10-CM | POA: Diagnosis not present

## 2021-12-20 DIAGNOSIS — M6281 Muscle weakness (generalized): Secondary | ICD-10-CM

## 2021-12-20 DIAGNOSIS — C50411 Malignant neoplasm of upper-outer quadrant of right female breast: Secondary | ICD-10-CM

## 2021-12-20 DIAGNOSIS — M79621 Pain in right upper arm: Secondary | ICD-10-CM | POA: Diagnosis not present

## 2021-12-20 NOTE — Therapy (Signed)
?OUTPATIENT PHYSICAL THERAPY TREATMENT NOTE ? ? ?Patient Name: Cheyenne Lee ?MRN: [id number] ?DOB:1941-07-02, 81 y.o., female ?Today's Date: 12/20/2021 ? ?PCP: [name], MD ?REFERRING PROVIDER: Deanne Coffer* ? ? PT End of Session - 12/20/21 1406   ? ? Visit Number 6   ? Number of Visits 9   ? Date for PT Re-Evaluation 01/02/22   ? PT Start Time 1405   ? PT Stop Time 9937   ? PT Time Calculation (min) 40 min   ? Activity Tolerance Patient tolerated treatment well   ? Behavior During Therapy [facility] for tasks assessed/performed   ? ?  ?  ? ?  ? ? ? ?Past Medical History:  ?Diagnosis Date  ? Arthritis   ? hips  ? Breast cancer ([place]) 06/25/2017  ? Right breast  ? Cancer ([place])   ? right breast DCIS  ? Complication of anesthesia   ? Family history of breast cancer   ? Family history of colon cancer   ? Family history of malignant neoplasm of breast   ? Glaucoma   ? H/O seasonal allergies   ? Hypertension   ? IBS (irritable bowel syndrome)   ? Personal history of radiation therapy   ? PONV (postoperative nausea and vomiting)   ? ?Past Surgical History:  ?Procedure Laterality Date  ? AXILLARY LYMPH NODE DISSECTION Right 07/29/2017  ? Procedure: AXILLARY LYMPH NODE DISSECTION;  Surgeon: Donnie Mesa, MD;  Location: [place];  Service: General;  Laterality: Right;  ? AXILLARY SENTINEL NODE BIOPSY Right 07/29/2017  ? Procedure: RIGHT AXILLARY SENTINEL NODE BIOPSY;  Surgeon: Donnie Mesa, MD;  Location: [place];  Service: General;  Laterality: Right;  ? BREAST BIOPSY    ? Multiple Biopsies, both breasts  ? BREAST LUMPECTOMY Right 2018  ? BREAST LUMPECTOMY WITH RADIOACTIVE SEED LOCALIZATION Right 07/15/2017  ? Procedure: RIGHT BREAST LUMPECTOMY WITH RADIOACTIVE SEED LOCALIZATION ERAS PATHWAY;  Surgeon: Donnie Mesa, MD;  Location: [place];  Service: General;  Laterality: Right;  LMA  ? CESAREAN SECTION    ? x4  ? ?Patient Active Problem List  ? Diagnosis  Date Noted  ? Genetic testing 09/30/2017  ? Family history of colon cancer   ? Family history of breast cancer   ? Malignant neoplasm of upper-outer quadrant of right breast in female, estrogen receptor positive ([place]) 08/12/2017  ? Family history of malignant neoplasm of breast   ? HYPERLIPIDEMIA 10/21/2008  ? OBESITY 10/21/2008  ? HYPERTENSION 10/21/2008  ? ALLERGIC RHINITIS 10/21/2008  ? DIVERTICULOSIS, COLON 10/21/2008  ? FEVER UNSPECIFIED 10/14/2008  ? OTHER ABNORMAL BLOOD CHEMISTRY 10/14/2008  ? ? ?REFERRING DIAG: C50.411,Z17.0 (ICD-10-CM) - Malignant neoplasm of upper-outer quadrant of right breast in female, estrogen receptor positive ([place])  ? ?THERAPY DIAG:  ?Acute pain of right shoulder ? ?Pain in right upper arm ? ?Stiffness of right shoulder, not elsewhere classified ? ?Muscle weakness (generalized) ? ?Abnormal posture ? ?Malignant neoplasm of upper-outer quadrant of right breast in female, estrogen receptor positive ([place]) ? ?PERTINENT HISTORY: 07/15/2017 ?Right breast cancer, Right lumpectomy: Grade 2 IDC with DCIS, margins negative, invasive tumor 0.8 cm the remainder was DCIS, 0/7 lymph nodes negative, ER 100%, PR 95%, Ki-67 10%, HER-2 negative ratio 1.43, T1b N0 stage I a; Radiation Therapy 09/08/2017 - 10/06/2017, Anti-estrogen oral therapy 11/06/17 - present, Anastrozole 1 mg daily, had a bad fall on 11/13/21 which required staples in her head (was standing on chair trying to reach  something up high) ? ?PRECAUTIONS: Other: at risk for lymphedema in LUE ? ?SUBJECTIVE: I was not as sore after I left last time. I think my shoulder is more relaxed.  ? ?PAIN:  ?Are you having pain? No ?OBJECTIVE ?  ?COGNITION: ?           Overall cognitive status: Within functional limits for tasks assessed  ?  ?PALPATION: Pt has tender palpable bumps in her upper arm that feel similar to lipomas ?  ?OBSERVATIONS / OTHER ASSESSMENTS: increased scar tissue palpable at scar line on R, increased muscle tension throughout R  upper quadrant ?  ?  ?POSTURE: rounded shoulders, forward head ?  ?UPPER EXTREMITY AROM/PROM: ?  ?A/PROM RIGHT  12/05/2021 ?  RIGHT 12/06/21 (following STM) RIGHT 12/10/21 (prior to therapy) RIGHT 12/18/21 RIGHT 12/20/21  ?Shoulder extension 50 52 56    ?Shoulder flexion 158 (discomfort in superior shoulder) 160 - no pain today 160 no pain 165 no pain 166  ?Shoulder abduction 155 (discomfort in superior shoulder) 163 (no pain) 172 (mild pain) 165 no pain 165  ?Shoulder internal rotation 26 with P! 20 with P! 15 with p! 16 with pain in upper arm, 25 with decreased pain after scapular mobilization   ?Shoulder external rotation 90      ?                        (Blank rows = not tested) ?  ?A/PROM LEFT  12/05/2021  ?Shoulder extension 45  ?Shoulder flexion 170  ?Shoulder abduction 160  ?Shoulder internal rotation 49  ?Shoulder external rotation 84  ?                        (Blank rows = not tested) ?  ?  ?  ?LYMPHEDEMA ASSESSMENTS:  ?  ?SURGERY TYPE/DATE: 07/15/22 R lumpectomy and SLNB (had 2 surgeries - first was lumpectomy and 2nd was SLNB) ?  ?NUMBER OF LYMPH NODES REMOVED: 7 all negative ?  ?CHEMOTHERAPY: did not require ?  ?RADIATION:completed ?  ?HORMONE TREATMENT: currently on Anastrozole ?  ?INFECTIONS: none ?  ?LYMPHEDEMA ASSESSMENTS:  ?  ?[place] RIGHT  12/05/2021  ?10 cm proximal to olecranon process 31.5  ?Olecranon process 26.5  ?10 cm proximal to ulnar styloid process 21.3  ?Just proximal to ulnar styloid process 17  ?Across hand at thumb web space 18.9  ?At base of 2nd digit 6.4  ?(Blank rows = not tested) ?  ?[place] LEFT  12/05/2021  ?10 cm proximal to olecranon process 31.3  ?Olecranon process 27  ?10 cm proximal to ulnar styloid process 20.5  ?Just proximal to ulnar styloid process 17.4  ?Across hand at thumb web space 18.5  ?At base of 2nd digit 6.5  ?(Blank rows = not tested) ?  ?  ?  ?  ?QUICK DASH SURVEY:  ?  [name] - 12/05/21 0001   ?  ?  Open a tight or new jar Moderate difficulty   ?  Do heavy  household chores (wash walls, wash floors) Moderate difficulty   ?  Carry a shopping bag or briefcase Mild difficulty   ?  Wash your back Mild difficulty   ?  Use a knife to cut food No difficulty   ?  Recreational activities in which you take some force or impact through your arm, shoulder, or hand (golf, hammering, tennis) Moderate difficulty   ?  During the past week,  to what extent has your arm, shoulder or hand problem interfered with your normal social activities with family, friends, neighbors, or groups? Not at all   ?  During the past week, to what extent has your arm, shoulder or hand problem limited your work or other regular daily activities Not at all   ?  Arm, shoulder, or hand pain. Moderate   ?  Tingling (pins and needles) in your arm, shoulder, or hand Mild   ?  Difficulty Sleeping Moderate difficulty   ?  DASH Score 29.55 %   ?  ?   ?  ?  ?   ?  ?  ?  ?  ?TODAY'S TREATMENT  ? ?3/23023-  ?Instructed pt today in supine scapular series while lying over towel roll for increased pec stretch using red theraband as follows: diagonals x 10 bilaterally with v/c for correct position of thumb and rotation of UE, horizontal abduction x 10 bilaterally, overhead flexion with narrow and wide grip x 10 each with verbal cues to keep elbows straight, and shoulder ER x 10 bilat with v/c to keep elbows tucked at sides  ?Gentle posterior capsular stretching grade 1-2 to decrease impingement ?Testing: tested for posterior capsular impingement today and it was a positive test ? ?12/18/21- ?Scapular mobilization: in L sidelying to R scapula in to protraction/retraction and elevation/depression with barely any mobility noted at first but improved greatly by end of session ?PROM: to R shoulder in to flexion and IR  ?STM: in L sidelying to R rhomboids, lateral edge of lats, upper traps and levator with numerous trigger points noted that improved with STM ? ?12/13/21-  ?Instructed pt today in supine scapular series using yellow  theraband as follows: diagonals x 10 bilaterally with v/c for correct position of thumb and rotation of UE, horizontal abduction x 10 bilaterally, overhead flexion with narrow and wide grip x 10 each with ve

## 2021-12-24 ENCOUNTER — Ambulatory Visit: Payer: Medicare Other | Attending: Adult Health

## 2021-12-24 DIAGNOSIS — M79621 Pain in right upper arm: Secondary | ICD-10-CM

## 2021-12-24 DIAGNOSIS — C50411 Malignant neoplasm of upper-outer quadrant of right female breast: Secondary | ICD-10-CM | POA: Diagnosis not present

## 2021-12-24 DIAGNOSIS — M25511 Pain in right shoulder: Secondary | ICD-10-CM

## 2021-12-24 DIAGNOSIS — Z170 Estrogen receptor positive status [ER+]: Secondary | ICD-10-CM

## 2021-12-24 DIAGNOSIS — M6281 Muscle weakness (generalized): Secondary | ICD-10-CM

## 2021-12-24 DIAGNOSIS — R293 Abnormal posture: Secondary | ICD-10-CM

## 2021-12-24 DIAGNOSIS — M25611 Stiffness of right shoulder, not elsewhere classified: Secondary | ICD-10-CM

## 2021-12-24 NOTE — Therapy (Signed)
?OUTPATIENT PHYSICAL THERAPY TREATMENT NOTE ? ? ?Patient Name: Cheyenne Lee ?MRN: [id number] ?DOB:05-11-1941, 81 y.o., female ?Today's Date: 12/24/2021 ? ?PCP: Cheyenne Amel, MD ?REFERRING PROVIDER: Deanne Lee* ? ? PT End of Session - 12/24/21 1453   ? ? Visit Number 7   ? Number of Visits 9   ? Date for PT Re-Evaluation 01/02/22   ? PT Start Time 7829   ? PT Stop Time 1453   ? PT Time Calculation (min) 61 min   ? Activity Tolerance Patient tolerated treatment well   ? Behavior During Therapy [facility] for tasks assessed/performed   ? ?  ?  ? ?  ? ? ? ? ?Past Medical History:  ?Diagnosis Date  ? Arthritis   ? hips  ? Breast cancer ([place]) 06/25/2017  ? Right breast  ? Cancer ([place])   ? right breast DCIS  ? Complication of anesthesia   ? Family history of breast cancer   ? Family history of colon cancer   ? Family history of malignant neoplasm of breast   ? Glaucoma   ? H/O seasonal allergies   ? Hypertension   ? IBS (irritable bowel syndrome)   ? Personal history of radiation therapy   ? PONV (postoperative nausea and vomiting)   ? ?Past Surgical History:  ?Procedure Laterality Date  ? AXILLARY LYMPH NODE DISSECTION Right 07/29/2017  ? Procedure: AXILLARY LYMPH NODE DISSECTION;  Surgeon: Cheyenne Mesa, MD;  Location: [place];  Service: General;  Laterality: Right;  ? AXILLARY SENTINEL NODE BIOPSY Right 07/29/2017  ? Procedure: RIGHT AXILLARY SENTINEL NODE BIOPSY;  Surgeon: Cheyenne Mesa, MD;  Location: [place];  Service: General;  Laterality: Right;  ? BREAST BIOPSY    ? Multiple Biopsies, both breasts  ? BREAST LUMPECTOMY Right 2018  ? BREAST LUMPECTOMY WITH RADIOACTIVE SEED LOCALIZATION Right 07/15/2017  ? Procedure: RIGHT BREAST LUMPECTOMY WITH RADIOACTIVE SEED LOCALIZATION ERAS PATHWAY;  Surgeon: Cheyenne Mesa, MD;  Location: [place];  Service: General;  Laterality: Right;  LMA  ? CESAREAN SECTION    ? x4  ? ?Patient Active Problem List  ? Diagnosis  Date Noted  ? Genetic testing 09/30/2017  ? Family history of colon cancer   ? Family history of breast cancer   ? Malignant neoplasm of upper-outer quadrant of right breast in female, estrogen receptor positive ([place]) 08/12/2017  ? Family history of malignant neoplasm of breast   ? HYPERLIPIDEMIA 10/21/2008  ? OBESITY 10/21/2008  ? HYPERTENSION 10/21/2008  ? ALLERGIC RHINITIS 10/21/2008  ? DIVERTICULOSIS, COLON 10/21/2008  ? FEVER UNSPECIFIED 10/14/2008  ? OTHER ABNORMAL BLOOD CHEMISTRY 10/14/2008  ? ? ?REFERRING DIAG: C50.411,Z17.0 (ICD-10-CM) - Malignant neoplasm of upper-outer quadrant of right breast in female, estrogen receptor positive ([place])  ? ?THERAPY DIAG:  ?Acute pain of right shoulder ? ?Pain in right upper arm ? ?Stiffness of right shoulder, not elsewhere classified ? ?Muscle weakness (generalized) ? ?Abnormal posture ? ?Malignant neoplasm of upper-outer quadrant of right breast in female, estrogen receptor positive ([place]) ? ?PERTINENT HISTORY: 07/15/2017 ?Right breast cancer, Right lumpectomy: Grade 2 IDC with DCIS, margins negative, invasive tumor 0.8 cm the remainder was DCIS, 0/7 lymph nodes negative, ER 100%, PR 95%, Ki-67 10%, HER-2 negative ratio 1.43, T1b N0 stage I a; Radiation Therapy 09/08/2017 - 10/06/2017, Anti-estrogen oral therapy 11/06/17 - present, Anastrozole 1 mg daily, had a bad fall on 11/13/21 which required staples in her head (was standing on chair trying to  reach something up high) ? ?PRECAUTIONS: Other: at risk for lymphedema in LUE ? ?SUBJECTIVE: The new exercises have been great. I've been using the red theraband and that is going really good. I have also been able to lay on my Rt side some lately which I couldn't do at all before.  ? ?PAIN:  ?Are you having pain? No ?OBJECTIVE ?  ?COGNITION: ?           Overall cognitive status: Within functional limits for tasks assessed  ?  ?PALPATION: Pt has tender palpable bumps in her upper arm that feel similar to lipomas ?  ?OBSERVATIONS /  OTHER ASSESSMENTS: increased scar tissue palpable at scar line on R, increased muscle tension throughout R upper quadrant ?  ?  ?POSTURE: rounded shoulders, forward head ?  ?UPPER EXTREMITY AROM/PROM: ?  ?A/PROM RIGHT  12/05/2021 ?  RIGHT 12/06/21 (following STM) RIGHT 12/10/21 (prior to therapy) RIGHT 12/18/21 RIGHT 12/20/21  ?Shoulder extension 50 52 56    ?Shoulder flexion 158 (discomfort in superior shoulder) 160 - no pain today 160 no pain 165 no pain 166  ?Shoulder abduction 155 (discomfort in superior shoulder) 163 (no pain) 172 (mild pain) 165 no pain 165  ?Shoulder internal rotation 26 with P! 20 with P! 15 with p! 16 with pain in upper arm, 25 with decreased pain after scapular mobilization   ?Shoulder external rotation 90      ?                        (Blank rows = not tested) ?  ?A/PROM LEFT  12/05/2021  ?Shoulder extension 45  ?Shoulder flexion 170  ?Shoulder abduction 160  ?Shoulder internal rotation 49  ?Shoulder external rotation 84  ?                        (Blank rows = not tested) ?  ?  ?  ?LYMPHEDEMA ASSESSMENTS:  ?  ?SURGERY TYPE/DATE: 07/15/22 R lumpectomy and SLNB (had 2 surgeries - first was lumpectomy and 2nd was SLNB) ?  ?NUMBER OF LYMPH NODES REMOVED: 7 all negative ?  ?CHEMOTHERAPY: did not require ?  ?RADIATION:completed ?  ?HORMONE TREATMENT: currently on Anastrozole ?  ?INFECTIONS: none ?  ?LYMPHEDEMA ASSESSMENTS:  ?  ?[place] RIGHT  12/05/2021  ?10 cm proximal to olecranon process 31.5  ?Olecranon process 26.5  ?10 cm proximal to ulnar styloid process 21.3  ?Just proximal to ulnar styloid process 17  ?Across hand at thumb web space 18.9  ?At base of 2nd digit 6.4  ?(Blank rows = not tested) ?  ?[place] LEFT  12/05/2021  ?10 cm proximal to olecranon process 31.3  ?Olecranon process 27  ?10 cm proximal to ulnar styloid process 20.5  ?Just proximal to ulnar styloid process 17.4  ?Across hand at thumb web space 18.5  ?At base of 2nd digit 6.5  ?(Blank rows = not tested) ?  ?  ?  ?  ?QUICK DASH  SURVEY:  ?  Cheyenne Lee - 12/05/21 0001   ?  ?  Open a tight or new jar Moderate difficulty   ?  Do heavy household chores (wash walls, wash floors) Moderate difficulty   ?  Carry a shopping bag or briefcase Mild difficulty   ?  Wash your back Mild difficulty   ?  Use a knife to cut food No difficulty   ?  Recreational activities in which you take some  force or impact through your arm, shoulder, or hand (golf, hammering, tennis) Moderate difficulty   ?  During the past week, to what extent has your arm, shoulder or hand problem interfered with your normal social activities with family, friends, neighbors, or groups? Not at all   ?  During the past week, to what extent has your arm, shoulder or hand problem limited your work or other regular daily activities Not at all   ?  Arm, shoulder, or hand pain. Moderate   ?  Tingling (pins and needles) in your arm, shoulder, or hand Mild   ?  Difficulty Sleeping Moderate difficulty   ?  DASH Score 29.55 %   ?  ?   ?  ?  ?   ?  ?  ?  ?  ?TODAY'S TREATMENT  ? ?12/24/2021: ?Scapular mobilization: in L sidelying to R scapula in to protraction/retraction with limited mobility that improved by end of session ?PROM: to R shoulder in to flexion and IR to pts tolerance, gentle scapular depression during but some discomfort with this ?STM: with cocoa butter in Lt sidelying to R rhomboids, lateral edge of lats, upper traps and levator and medial scapular border; pt very tender to touch so light pressure used ? Mobilizations: To Rt shoulder posteriorly and inferiorly throughout P/ROM ? ?12/20/3021-  ?Instructed pt today in supine scapular series while lying over towel roll for increased pec stretch using red theraband as follows: diagonals x 10 bilaterally with v/c for correct position of thumb and rotation of UE, horizontal abduction x 10 bilaterally, overhead flexion with narrow and wide grip x 10 each with verbal cues to keep elbows straight, and shoulder ER x 10 bilat with v/c to keep  elbows tucked at sides  ?Gentle posterior capsular stretching grade 1-2 to decrease impingement ?Testing: tested for posterior capsular impingement today and it was a positive test ? ?12/18/21- ?Scapular

## 2021-12-26 ENCOUNTER — Ambulatory Visit: Payer: Medicare Other | Admitting: Physical Therapy

## 2021-12-26 ENCOUNTER — Encounter: Payer: Self-pay | Admitting: Physical Therapy

## 2021-12-26 DIAGNOSIS — M25511 Pain in right shoulder: Secondary | ICD-10-CM | POA: Diagnosis not present

## 2021-12-26 DIAGNOSIS — M6281 Muscle weakness (generalized): Secondary | ICD-10-CM

## 2021-12-26 DIAGNOSIS — C50411 Malignant neoplasm of upper-outer quadrant of right female breast: Secondary | ICD-10-CM | POA: Diagnosis not present

## 2021-12-26 DIAGNOSIS — R293 Abnormal posture: Secondary | ICD-10-CM

## 2021-12-26 DIAGNOSIS — M25611 Stiffness of right shoulder, not elsewhere classified: Secondary | ICD-10-CM | POA: Diagnosis not present

## 2021-12-26 DIAGNOSIS — M79621 Pain in right upper arm: Secondary | ICD-10-CM

## 2021-12-26 DIAGNOSIS — Z170 Estrogen receptor positive status [ER+]: Secondary | ICD-10-CM

## 2021-12-26 NOTE — Therapy (Signed)
?OUTPATIENT PHYSICAL THERAPY TREATMENT NOTE ? ? ?Patient Name: Cheyenne Lee ?MRN: [id number] ?DOB:Feb 05, 1941, 81 y.o., female ?Today's Date: 12/26/2021 ? ?PCP: [name], MD ?REFERRING PROVIDER: Deanne Coffer* ? ? PT End of Session - 12/26/21 1407   ? ? Visit Number 8   ? Number of Visits 9   ? Date for PT Re-Evaluation 01/02/22   ? PT Start Time 1406   ? PT Stop Time 4431   ? PT Time Calculation (min) 40 min   ? Activity Tolerance Patient tolerated treatment well   ? Behavior During Therapy [facility] for tasks assessed/performed   ? ?  ?  ? ?  ? ? ? ? ?Past Medical History:  ?Diagnosis Date  ? Arthritis   ? hips  ? Breast cancer ([place]) 06/25/2017  ? Right breast  ? Cancer ([place])   ? right breast DCIS  ? Complication of anesthesia   ? Family history of breast cancer   ? Family history of colon cancer   ? Family history of malignant neoplasm of breast   ? Glaucoma   ? H/O seasonal allergies   ? Hypertension   ? IBS (irritable bowel syndrome)   ? Personal history of radiation therapy   ? PONV (postoperative nausea and vomiting)   ? ?Past Surgical History:  ?Procedure Laterality Date  ? AXILLARY LYMPH NODE DISSECTION Right 07/29/2017  ? Procedure: AXILLARY LYMPH NODE DISSECTION;  Surgeon: Donnie Mesa, MD;  Location: [place];  Service: General;  Laterality: Right;  ? AXILLARY SENTINEL NODE BIOPSY Right 07/29/2017  ? Procedure: RIGHT AXILLARY SENTINEL NODE BIOPSY;  Surgeon: Donnie Mesa, MD;  Location: [place];  Service: General;  Laterality: Right;  ? BREAST BIOPSY    ? Multiple Biopsies, both breasts  ? BREAST LUMPECTOMY Right 2018  ? BREAST LUMPECTOMY WITH RADIOACTIVE SEED LOCALIZATION Right 07/15/2017  ? Procedure: RIGHT BREAST LUMPECTOMY WITH RADIOACTIVE SEED LOCALIZATION ERAS PATHWAY;  Surgeon: Donnie Mesa, MD;  Location: [place];  Service: General;  Laterality: Right;  LMA  ? CESAREAN SECTION    ? x4  ? ?Patient Active Problem List  ? Diagnosis  Date Noted  ? Genetic testing 09/30/2017  ? Family history of colon cancer   ? Family history of breast cancer   ? Malignant neoplasm of upper-outer quadrant of right breast in female, estrogen receptor positive ([place]) 08/12/2017  ? Family history of malignant neoplasm of breast   ? HYPERLIPIDEMIA 10/21/2008  ? OBESITY 10/21/2008  ? HYPERTENSION 10/21/2008  ? ALLERGIC RHINITIS 10/21/2008  ? DIVERTICULOSIS, COLON 10/21/2008  ? FEVER UNSPECIFIED 10/14/2008  ? OTHER ABNORMAL BLOOD CHEMISTRY 10/14/2008  ? ? ?REFERRING DIAG: C50.411,Z17.0 (ICD-10-CM) - Malignant neoplasm of upper-outer quadrant of right breast in female, estrogen receptor positive ([place])  ? ?THERAPY DIAG:  ?Acute pain of right shoulder ? ?Pain in right upper arm ? ?Stiffness of right shoulder, not elsewhere classified ? ?Muscle weakness (generalized) ? ?Abnormal posture ? ?Malignant neoplasm of upper-outer quadrant of right breast in female, estrogen receptor positive ([place]) ? ?PERTINENT HISTORY: 07/15/2017 ?Right breast cancer, Right lumpectomy: Grade 2 IDC with DCIS, margins negative, invasive tumor 0.8 cm the remainder was DCIS, 0/7 lymph nodes negative, ER 100%, PR 95%, Ki-67 10%, HER-2 negative ratio 1.43, T1b N0 stage I a; Radiation Therapy 09/08/2017 - 10/06/2017, Anti-estrogen oral therapy 11/06/17 - present, Anastrozole 1 mg daily, had a bad fall on 11/13/21 which required staples in her head (was standing on chair trying to  reach something up high) ? ?PRECAUTIONS: Other: at risk for lymphedema in LUE ? ?SUBJECTIVE: The pain is getting better. The exercises are getting easier and I am not sore afterwards. I was a little sore after last session but it was not bad.   ? ?PAIN:  ?Are you having pain? No ?OBJECTIVE ?  ?COGNITION: ?           Overall cognitive status: Within functional limits for tasks assessed  ?  ?PALPATION: Pt has tender palpable bumps in her upper arm that feel similar to lipomas ?  ?OBSERVATIONS / OTHER ASSESSMENTS: increased scar  tissue palpable at scar line on R, increased muscle tension throughout R upper quadrant ?  ?  ?POSTURE: rounded shoulders, forward head ?  ?UPPER EXTREMITY AROM/PROM: ?  ?A/PROM RIGHT  12/05/2021 ?  RIGHT 12/06/21 (following STM) RIGHT 12/10/21 (prior to therapy) RIGHT 12/18/21 RIGHT 12/20/21 RIGHT 12/26/21  ?Shoulder extension 50 52 56     ?Shoulder flexion 158 (discomfort in superior shoulder) 160 - no pain today 160 no pain 165 no pain 166   ?Shoulder abduction 155 (discomfort in superior shoulder) 163 (no pain) 172 (mild pain) 165 no pain 165   ?Shoulder internal rotation 26 with P! 20 with P! 15 with p! 16 with pain in upper arm, 25 with decreased pain after scapular mobilization  40 with mild pain  ?Shoulder external rotation 90       ?                        (Blank rows = not tested) ?  ?A/PROM LEFT  12/05/2021  ?Shoulder extension 45  ?Shoulder flexion 170  ?Shoulder abduction 160  ?Shoulder internal rotation 49  ?Shoulder external rotation 84  ?                        (Blank rows = not tested) ?  ?  ?  ?LYMPHEDEMA ASSESSMENTS:  ?  ?SURGERY TYPE/DATE: 07/15/22 R lumpectomy and SLNB (had 2 surgeries - first was lumpectomy and 2nd was SLNB) ?  ?NUMBER OF LYMPH NODES REMOVED: 7 all negative ?  ?CHEMOTHERAPY: did not require ?  ?RADIATION:completed ?  ?HORMONE TREATMENT: currently on Anastrozole ?  ?INFECTIONS: none ?  ?LYMPHEDEMA ASSESSMENTS:  ?  ?[place] RIGHT  12/05/2021  ?10 cm proximal to olecranon process 31.5  ?Olecranon process 26.5  ?10 cm proximal to ulnar styloid process 21.3  ?Just proximal to ulnar styloid process 17  ?Across hand at thumb web space 18.9  ?At base of 2nd digit 6.4  ?(Blank rows = not tested) ?  ?[place] LEFT  12/05/2021  ?10 cm proximal to olecranon process 31.3  ?Olecranon process 27  ?10 cm proximal to ulnar styloid process 20.5  ?Just proximal to ulnar styloid process 17.4  ?Across hand at thumb web space 18.5  ?At base of 2nd digit 6.5  ?(Blank rows = not tested) ?  ?  ?  ?  ?QUICK DASH  SURVEY:  ?  Cheyenne Lee - 12/05/21 0001   ?  ?  Open a tight or new jar Moderate difficulty   ?  Do heavy household chores (wash walls, wash floors) Moderate difficulty   ?  Carry a shopping bag or briefcase Mild difficulty   ?  Wash your back Mild difficulty   ?  Use a knife to cut food No difficulty   ?  Recreational activities in which you  take some force or impact through your arm, shoulder, or hand (golf, hammering, tennis) Moderate difficulty   ?  During the past week, to what extent has your arm, shoulder or hand problem interfered with your normal social activities with family, friends, neighbors, or groups? Not at all   ?  During the past week, to what extent has your arm, shoulder or hand problem limited your work or other regular daily activities Not at all   ?  Arm, shoulder, or hand pain. Moderate   ?  Tingling (pins and needles) in your arm, shoulder, or hand Mild   ?  Difficulty Sleeping Moderate difficulty   ?  DASH Score 29.55 %   ?  ?   ?  ?  ?   ?  ?  ?  ?  ?TODAY'S TREATMENT  ? ?12/26/2021: ?-Scapular mobilization: in L sidelying to R scapula in to protraction/retraction with improved mobility noted today ?-PROM: to R shoulder in to flexion and abduction with no discomfort at all and no stretch felt due to full ROM ?-STM: with cocoa butter in Lt sidelying to R rhomboids, lateral edge of lats, upper traps and levator and medial scapular border; pt with several areas of soreness but was able to tolerate medium pressure ? ? ?12/24/2021: ?Scapular mobilization: in L sidelying to R scapula in to protraction/retraction with limited mobility that improved by end of session ?PROM: to R shoulder in to flexion and IR to pts tolerance, gentle scapular depression during but some discomfort with this ?STM: with cocoa butter in Lt sidelying to R rhomboids, lateral edge of lats, upper traps and levator and medial scapular border; pt very tender to touch so light pressure used ? Mobilizations: To Rt shoulder  posteriorly and inferiorly throughout P/ROM ? ?12/20/3021-  ?Instructed pt today in supine scapular series while lying over towel roll for increased pec stretch using red theraband as follows: diagonals x 10 bilate

## 2022-01-01 ENCOUNTER — Encounter: Payer: Self-pay | Admitting: Physical Therapy

## 2022-01-01 ENCOUNTER — Ambulatory Visit: Payer: Medicare Other | Admitting: Physical Therapy

## 2022-01-01 DIAGNOSIS — R293 Abnormal posture: Secondary | ICD-10-CM

## 2022-01-01 DIAGNOSIS — M25511 Pain in right shoulder: Secondary | ICD-10-CM | POA: Diagnosis not present

## 2022-01-01 DIAGNOSIS — M25611 Stiffness of right shoulder, not elsewhere classified: Secondary | ICD-10-CM | POA: Diagnosis not present

## 2022-01-01 DIAGNOSIS — M6281 Muscle weakness (generalized): Secondary | ICD-10-CM | POA: Diagnosis not present

## 2022-01-01 DIAGNOSIS — M79621 Pain in right upper arm: Secondary | ICD-10-CM

## 2022-01-01 DIAGNOSIS — C50411 Malignant neoplasm of upper-outer quadrant of right female breast: Secondary | ICD-10-CM | POA: Diagnosis not present

## 2022-01-01 NOTE — Therapy (Signed)
?OUTPATIENT PHYSICAL THERAPY TREATMENT NOTE ? ? ?Patient Name: Cheyenne Lee ?MRN: [id number] ?DOB:09-22-1941, 81 y.o., female ?Today's Date: 01/01/2022 ? ?PCP: [name], MD ?REFERRING PROVIDER: Deanne Coffer* ? ? PT End of Session - 01/01/22 1358   ? ? Visit Number 9   ? Number of Visits 9   ? Date for PT Re-Evaluation 01/02/22   ? PT Start Time 2952   ? PT Stop Time 1440   ? PT Time Calculation (min) 43 min   ? Activity Tolerance Patient tolerated treatment well   ? Behavior During Therapy [facility] for tasks assessed/performed   ? ?  ?  ? ?  ? ? ? ? ?Past Medical History:  ?Diagnosis Date  ? Arthritis   ? hips  ? Breast cancer ([place]) 06/25/2017  ? Right breast  ? Cancer ([place])   ? right breast DCIS  ? Complication of anesthesia   ? Family history of breast cancer   ? Family history of colon cancer   ? Family history of malignant neoplasm of breast   ? Glaucoma   ? H/O seasonal allergies   ? Hypertension   ? IBS (irritable bowel syndrome)   ? Personal history of radiation therapy   ? PONV (postoperative nausea and vomiting)   ? ?Past Surgical History:  ?Procedure Laterality Date  ? AXILLARY LYMPH NODE DISSECTION Right 07/29/2017  ? Procedure: AXILLARY LYMPH NODE DISSECTION;  Surgeon: Donnie Mesa, MD;  Location: [place];  Service: General;  Laterality: Right;  ? AXILLARY SENTINEL NODE BIOPSY Right 07/29/2017  ? Procedure: RIGHT AXILLARY SENTINEL NODE BIOPSY;  Surgeon: Donnie Mesa, MD;  Location: [place];  Service: General;  Laterality: Right;  ? BREAST BIOPSY    ? Multiple Biopsies, both breasts  ? BREAST LUMPECTOMY Right 2018  ? BREAST LUMPECTOMY WITH RADIOACTIVE SEED LOCALIZATION Right 07/15/2017  ? Procedure: RIGHT BREAST LUMPECTOMY WITH RADIOACTIVE SEED LOCALIZATION ERAS PATHWAY;  Surgeon: Donnie Mesa, MD;  Location: [place];  Service: General;  Laterality: Right;  LMA  ? CESAREAN SECTION    ? x4  ? ?Patient Active Problem List  ? Diagnosis  Date Noted  ? Genetic testing 09/30/2017  ? Family history of colon cancer   ? Family history of breast cancer   ? Malignant neoplasm of upper-outer quadrant of right breast in female, estrogen receptor positive ([place]) 08/12/2017  ? Family history of malignant neoplasm of breast   ? HYPERLIPIDEMIA 10/21/2008  ? OBESITY 10/21/2008  ? HYPERTENSION 10/21/2008  ? ALLERGIC RHINITIS 10/21/2008  ? DIVERTICULOSIS, COLON 10/21/2008  ? FEVER UNSPECIFIED 10/14/2008  ? OTHER ABNORMAL BLOOD CHEMISTRY 10/14/2008  ? ? ?REFERRING DIAG: C50.411,Z17.0 (ICD-10-CM) - Malignant neoplasm of upper-outer quadrant of right breast in female, estrogen receptor positive ([place])  ? ?THERAPY DIAG:  ?Acute pain of right shoulder ? ?Pain in right upper arm ? ?Stiffness of right shoulder, not elsewhere classified ? ?Muscle weakness (generalized) ? ?Abnormal posture ? ?Malignant neoplasm of upper-outer quadrant of right breast in female, estrogen receptor positive ([place]) ? ?PERTINENT HISTORY: 07/15/2017 ?Right breast cancer, Right lumpectomy: Grade 2 IDC with DCIS, margins negative, invasive tumor 0.8 cm the remainder was DCIS, 0/7 lymph nodes negative, ER 100%, PR 95%, Ki-67 10%, HER-2 negative ratio 1.43, T1b N0 stage I a; Radiation Therapy 09/08/2017 - 10/06/2017, Anti-estrogen oral therapy 11/06/17 - present, Anastrozole 1 mg daily, had a bad fall on 11/13/21 which required staples in her head (was standing on chair trying to  reach something up high) ? ?PRECAUTIONS: Other: at risk for lymphedema in LUE ? ?SUBJECTIVE: The shoulder is feeling really great.  ? ?PAIN:  ?Are you having pain? No ?OBJECTIVE ?  ?COGNITION: ?           Overall cognitive status: Within functional limits for tasks assessed  ?  ?PALPATION: Pt has tender palpable bumps in her upper arm that feel similar to lipomas ?  ?OBSERVATIONS / OTHER ASSESSMENTS: increased scar tissue palpable at scar line on R, increased muscle tension throughout R upper quadrant ?  ?  ?POSTURE: rounded  shoulders, forward head ?  ?UPPER EXTREMITY AROM/PROM: ?  ?A/PROM RIGHT  12/05/2021 ?  RIGHT 12/06/21 (following STM) RIGHT 12/10/21 (prior to therapy) RIGHT 12/18/21 RIGHT 12/20/21 RIGHT 12/26/21 RIGHT 01/01/22  ?Shoulder extension 50 52 56      ?Shoulder flexion 158 (discomfort in superior shoulder) 160 - no pain today 160 no pain 165 no pain 166    ?Shoulder abduction 155 (discomfort in superior shoulder) 163 (no pain) 172 (mild pain) 165 no pain 165    ?Shoulder internal rotation 26 with P! 20 with P! 15 with p! 16 with pain in upper arm, 25 with decreased pain after scapular mobilization  40 with mild pain 63  ?Shoulder external rotation 90        ?                        (Blank rows = not tested) ?  ?A/PROM LEFT  12/05/2021  ?Shoulder extension 45  ?Shoulder flexion 170  ?Shoulder abduction 160  ?Shoulder internal rotation 49  ?Shoulder external rotation 84  ?                        (Blank rows = not tested) ?  ?  ?  ?LYMPHEDEMA ASSESSMENTS:  ?  ?SURGERY TYPE/DATE: 07/15/22 R lumpectomy and SLNB (had 2 surgeries - first was lumpectomy and 2nd was SLNB) ?  ?NUMBER OF LYMPH NODES REMOVED: 7 all negative ?  ?CHEMOTHERAPY: did not require ?  ?RADIATION:completed ?  ?HORMONE TREATMENT: currently on Anastrozole ?  ?INFECTIONS: none ?  ?LYMPHEDEMA ASSESSMENTS:  ?  ?[place] RIGHT  12/05/2021  ?10 cm proximal to olecranon process 31.5  ?Olecranon process 26.5  ?10 cm proximal to ulnar styloid process 21.3  ?Just proximal to ulnar styloid process 17  ?Across hand at thumb web space 18.9  ?At base of 2nd digit 6.4  ?(Blank rows = not tested) ?  ?[place] LEFT  12/05/2021  ?10 cm proximal to olecranon process 31.3  ?Olecranon process 27  ?10 cm proximal to ulnar styloid process 20.5  ?Just proximal to ulnar styloid process 17.4  ?Across hand at thumb web space 18.5  ?At base of 2nd digit 6.5  ?(Blank rows = not tested) ?  ?  ?  ?  ?QUICK DASH SURVEY:  ?  [name] - 12/05/21 0001   ?  ?  Open a tight or new jar Moderate difficulty    ?  Do heavy household chores (wash walls, wash floors) Moderate difficulty   ?  Carry a shopping bag or briefcase Mild difficulty   ?  Wash your back Mild difficulty   ?  Use a knife to cut food No difficulty   ?  Recreational activities in which you take some force or impact through your arm, shoulder, or hand (golf, hammering, tennis) Moderate difficulty   ?  During the past week, to what extent has your arm, shoulder or hand problem interfered with your normal social activities with family, friends, neighbors, or groups? Not at all   ?  During the past week, to what extent has your arm, shoulder or hand problem limited your work or other regular daily activities Not at all   ?  Arm, shoulder, or hand pain. Moderate   ?  Tingling (pins and needles) in your arm, shoulder, or hand Mild   ?  Difficulty Sleeping Moderate difficulty   ?  DASH Score 29.55 %   ?  ?   ?  ?  ?   ?  ?  ?  ?  ?TODAY'S TREATMENT  ? ?01/01/2022: ?-PROM: to R shoulder in to flexion and abduction with no discomfort at all and no stretch felt due to full ROM ?-STM: with cocoa butter in Lt sidelying to R rhomboids, lateral edge of lats, upper traps and levator and medial scapular border; pt with several areas of soreness but was able to tolerate medium pressure ? ?12/26/2021: ?-Scapular mobilization: in L sidelying to R scapula in to protraction/retraction with improved mobility noted today ?-PROM: to R shoulder in to flexion and abduction with no discomfort at all and no stretch felt due to full ROM ?-STM: with cocoa butter in Lt sidelying to R rhomboids, lateral edge of lats, upper traps and levator and medial scapular border; pt with several areas of soreness but was able to tolerate medium pressure ? ? ?12/24/2021: ?Scapular mobilization: in L sidelying to R scapula in to protraction/retraction with limited mobility that improved by end of session ?PROM: to R shoulder in to flexion and IR to pts tolerance, gentle scapular depression during but  some discomfort with this ?STM: with cocoa butter in Lt sidelying to R rhomboids, lateral edge of lats, upper traps and levator and medial scapular border; pt very tender to touch so light pressure used ? Mo

## 2022-01-03 ENCOUNTER — Encounter: Payer: Medicare Other | Admitting: Physical Therapy

## 2022-02-07 DIAGNOSIS — E7800 Pure hypercholesterolemia, unspecified: Secondary | ICD-10-CM | POA: Diagnosis not present

## 2022-02-07 DIAGNOSIS — R7303 Prediabetes: Secondary | ICD-10-CM | POA: Diagnosis not present

## 2022-02-07 DIAGNOSIS — Z79899 Other long term (current) drug therapy: Secondary | ICD-10-CM | POA: Diagnosis not present

## 2022-02-08 DIAGNOSIS — E7800 Pure hypercholesterolemia, unspecified: Secondary | ICD-10-CM | POA: Diagnosis not present

## 2022-02-08 DIAGNOSIS — M1990 Unspecified osteoarthritis, unspecified site: Secondary | ICD-10-CM | POA: Diagnosis not present

## 2022-02-08 DIAGNOSIS — C50911 Malignant neoplasm of unspecified site of right female breast: Secondary | ICD-10-CM | POA: Diagnosis not present

## 2022-02-08 DIAGNOSIS — Z0001 Encounter for general adult medical examination with abnormal findings: Secondary | ICD-10-CM | POA: Diagnosis not present

## 2022-02-08 DIAGNOSIS — I10 Essential (primary) hypertension: Secondary | ICD-10-CM | POA: Diagnosis not present

## 2022-02-14 DIAGNOSIS — Z86010 Personal history of colonic polyps: Secondary | ICD-10-CM | POA: Diagnosis not present

## 2022-02-14 DIAGNOSIS — Z09 Encounter for follow-up examination after completed treatment for conditions other than malignant neoplasm: Secondary | ICD-10-CM | POA: Diagnosis not present

## 2022-02-14 DIAGNOSIS — D120 Benign neoplasm of cecum: Secondary | ICD-10-CM | POA: Diagnosis not present

## 2022-02-14 DIAGNOSIS — K648 Other hemorrhoids: Secondary | ICD-10-CM | POA: Diagnosis not present

## 2022-02-14 DIAGNOSIS — D123 Benign neoplasm of transverse colon: Secondary | ICD-10-CM | POA: Diagnosis not present

## 2022-02-14 DIAGNOSIS — K5730 Diverticulosis of large intestine without perforation or abscess without bleeding: Secondary | ICD-10-CM | POA: Diagnosis not present

## 2022-02-20 DIAGNOSIS — D123 Benign neoplasm of transverse colon: Secondary | ICD-10-CM | POA: Diagnosis not present

## 2022-04-15 DIAGNOSIS — H401211 Low-tension glaucoma, right eye, mild stage: Secondary | ICD-10-CM | POA: Diagnosis not present

## 2022-04-15 DIAGNOSIS — H401222 Low-tension glaucoma, left eye, moderate stage: Secondary | ICD-10-CM | POA: Diagnosis not present

## 2022-04-23 NOTE — Progress Notes (Signed)
Patient Care Team: [name], MD as PCP - General (Family Medicine) Nicholas Lose, MD as Consulting Physician (Hematology and Oncology) Kyung Rudd, MD as Consulting Physician (Radiation Oncology) Donnie Mesa, MD as Consulting Physician (General Surgery) Delice Bison Charlestine Massed, NP as Nurse Practitioner (Hematology and Oncology)  DIAGNOSIS:  Encounter Diagnosis  Name Primary?   Malignant neoplasm of upper-outer quadrant of right breast in female, estrogen receptor positive ([place])     SUMMARY OF ONCOLOGIC HISTORY: Oncology History  Malignant neoplasm of upper-outer quadrant of right breast in female, estrogen receptor positive ([place])  07/15/2017 Surgery   Right lumpectomy: Grade 2 IDC with DCIS, margins negative, invasive tumor 0.8 cm the remainder was DCIS, 0/7 lymph nodes negative, ER 100%, PR 95%, Ki-67 10%, HER-2 negative ratio 1.43, T1b N0 stage I a   07/22/2017 Oncotype testing   Oncotype DX score 2 (risk of distant recurrence 4%)   09/08/2017 - 10/06/2017 Radiation Therapy   Adjuvant radiation   09/30/2017 Genetic Testing   Negative genetic testing on the Multi-cancer panel.  The Multi-Gene Panel offered by [facility] includes sequencing and/or deletion duplication testing of the following 83 genes: ALK, APC, ATM, AXIN2,BAP1,  BARD1, BLM, BMPR1A, BRCA1, BRCA2, BRIP1, CASR, CDC73, CDH1, CDK4, CDKN1B, CDKN1C, CDKN2A (p14ARF), CDKN2A (p16INK4a), CEBPA, CHEK2, CTNNA1, DICER1, DIS3L2, EGFR (c.2369C>T, p.Thr790Met variant only), EPCAM (Deletion/duplication testing only), FH, FLCN, GATA2, GPC3, GREM1 (Promoter region deletion/duplication testing only), HOXB13 (c.251G>A, p.Gly84Glu), HRAS, KIT, MAX, MEN1, MET, MITF (c.952G>A, p.Glu318Lys variant only), MLH1, MSH2, MSH3, MSH6, MUTYH, NBN, NF1, NF2, NTHL1, PALB2, PDGFRA, PHOX2B, PMS2, POLD1, POLE, POT1, PRKAR1A, PTCH1, PTEN, RAD50, RAD51C, RAD51D, RB1, RECQL4, RET, RUNX1, SDHAF2, SDHA (sequence changes only), SDHB, SDHC, SDHD, SMAD4,  SMARCA4, SMARCB1, SMARCE1, STK11, SUFU, TERT, TERT, TMEM127, TP53, TSC1, TSC2, VHL, WRN and WT1.  The report date is September 30, 2017.     11/06/2017 -  Anti-estrogen oral therapy   Anastrozole 1 mg daily      CHIEF COMPLIANT: Follow-up of right breast cancer on anastrozole therapy   INTERVAL HISTORY: Cheyenne Lee is a 81 y.o. with above-mentioned history of right breast cancer treated with lumpectomy, radiation, and who is currently on anastrozole therapy. She presents to the clinic today for follow-up.    ALLERGIES:  is allergic to codeine, latex, and tape.  MEDICATIONS:  Current Outpatient Medications  Medication Sig Dispense Refill   brimonidine (ALPHAGAN) 0.2 % ophthalmic solution Place 1 drop into both eyes in the morning and at bedtime. 5 mL 12   anastrozole (ARIMIDEX) 1 MG tablet Take 1 tablet (1 mg total) by mouth daily. 90 tablet 1   Cholecalciferol (VITAMIN D3) 1000000 UNIT/GM LIQD See admin instructions.     dicyclomine (BENTYL) 20 MG tablet 1 tablet     fexofenadine (ALLEGRA) 180 MG tablet Take 180 mg by mouth as needed.      ibuprofen (ADVIL,MOTRIN) 200 MG tablet Take 200 mg by mouth every 6 (six) hours as needed.     latanoprost (XALATAN) 0.005 % ophthalmic solution Place 1 drop into both eyes nightly.     timolol (BETIMOL) 0.25 % ophthalmic solution Place 1-2 drops into both eyes 2 (two) times daily. 10 mL 12   timolol (TIMOPTIC) 0.25 % ophthalmic solution 1 drop into affected eye     triamterene-hydrochlorothiazide (MAXZIDE-25) 37.5-25 MG tablet Take 1 tablet by mouth daily. 37.5-25     No current facility-administered medications for this visit.    PHYSICAL EXAMINATION: ECOG PERFORMANCE STATUS: 1 - Symptomatic but completely ambulatory  Vitals:   05/02/22 0840  BP: (!) 142/73  Pulse: 87  Resp: 18  Temp: (!) 97.2 F (36.2 C)  SpO2: 99%   Filed Weights   05/02/22 0840  Weight: 171 lb (77.6 kg)    BREAST: No palpable masses or nodules in either  right or left breasts. No palpable axillary supraclavicular or infraclavicular adenopathy no breast tenderness or nipple discharge. (exam performed in the presence of a chaperone)  LABORATORY DATA:  I have reviewed the data as listed    Latest Ref Rng & Units 07/10/2017   12:51 PM  CMP  Glucose 65 - 99 mg/dL 142   BUN 6 - 20 mg/dL 15   Creatinine 0.44 - 1.00 mg/dL 0.93   Sodium 135 - 145 mmol/L 137   Potassium 3.5 - 5.1 mmol/L 4.0   Chloride 101 - 111 mmol/L 102   CO2 22 - 32 mmol/L 24   Calcium 8.9 - 10.3 mg/dL 9.1     No results found for: "WBC", "HGB", "HCT", "MCV", "PLT", "NEUTROABS"  ASSESSMENT & PLAN:  Malignant neoplasm of upper-outer quadrant of right breast in female, estrogen receptor positive (H[facility]) Intermittent myalgias especially in the morning but she is able to manage them fairly well. Rt arm pain: Improved.   Anastrozole toxicities: Doing significantly better than letrozole. She completes 5 years of therapy by March 2024.  She will discontinue it at that time.  Breast cancer surveillance: 1. Breast exam  05/02/2022: Benign 2. Mammogram 06/25/2021: Benign breast density category B.  This year's mammogram to be done on 06/26/2022 3. Bone density March 2019: T score -0.9   She had fallen and hurt her scalp and required staples.  She has recovered well from that.  Family history of breast cancer: She has extensive family history of breast cancer.  I recommended that she see genetic counselors for genetic testing. RTC on an as-needed basis.    No orders of the defined types were placed in this encounter.  The patient has a good understanding of the overall plan. she agrees with it. she will call with any problems that may develop before the next visit here. Total time spent: 30 mins including face to face time and time spent for planning, charting and co-ordination of care   [name], MD 05/02/22    I Gardiner Coins am scribing for Dr. Lindi Adie  I have  reviewed the above documentation for accuracy and completeness, and I agree with the above.

## 2022-04-25 DIAGNOSIS — K529 Noninfective gastroenteritis and colitis, unspecified: Secondary | ICD-10-CM | POA: Diagnosis not present

## 2022-04-30 DIAGNOSIS — K529 Noninfective gastroenteritis and colitis, unspecified: Secondary | ICD-10-CM | POA: Diagnosis not present

## 2022-05-01 NOTE — Assessment & Plan Note (Addendum)
Intermittent myalgias especially in the morning but she is able to manage them fairly well. Rt arm pain: Improved.  Anastrozole toxicities:Doing significantly better than letrozole.  Breast cancer surveillance: 1.Breast exam8/06/2022: Benign 2.Mammogram10/11/2020: Benign breast density category B.  This year's mammogram to be done on 06/26/2022 3. Bone density March 2019: T score -0.9  RTC in 1 year

## 2022-05-02 ENCOUNTER — Inpatient Hospital Stay: Payer: Medicare Other | Attending: Hematology and Oncology | Admitting: Hematology and Oncology

## 2022-05-02 ENCOUNTER — Other Ambulatory Visit: Payer: Self-pay

## 2022-05-02 DIAGNOSIS — Z803 Family history of malignant neoplasm of breast: Secondary | ICD-10-CM | POA: Diagnosis not present

## 2022-05-02 DIAGNOSIS — Z923 Personal history of irradiation: Secondary | ICD-10-CM | POA: Insufficient documentation

## 2022-05-02 DIAGNOSIS — C50411 Malignant neoplasm of upper-outer quadrant of right female breast: Secondary | ICD-10-CM | POA: Insufficient documentation

## 2022-05-02 DIAGNOSIS — Z170 Estrogen receptor positive status [ER+]: Secondary | ICD-10-CM | POA: Diagnosis not present

## 2022-05-02 DIAGNOSIS — Z79899 Other long term (current) drug therapy: Secondary | ICD-10-CM | POA: Insufficient documentation

## 2022-05-02 DIAGNOSIS — Z79811 Long term (current) use of aromatase inhibitors: Secondary | ICD-10-CM | POA: Insufficient documentation

## 2022-05-02 MED ORDER — BRIMONIDINE TARTRATE 0.2 % OP SOLN: 1.0000 [drp] | Freq: Two times a day (BID) | OPHTHALMIC | 12 refills | Status: DC

## 2022-05-02 MED ORDER — ANASTROZOLE 1 MG PO TABS: 1.0000 mg | ORAL_TABLET | Freq: Every day | ORAL | 1 refills | Status: DC

## 2022-05-23 DIAGNOSIS — Z23 Encounter for immunization: Secondary | ICD-10-CM | POA: Diagnosis not present

## 2022-06-25 DIAGNOSIS — Z23 Encounter for immunization: Secondary | ICD-10-CM | POA: Diagnosis not present

## 2022-06-26 ENCOUNTER — Ambulatory Visit: Payer: Medicare Other

## 2022-07-01 ENCOUNTER — Ambulatory Visit
Admission: RE | Admit: 2022-07-01 | Discharge: 2022-07-01 | Disposition: A | Discharge status: Home / Self Care | Payer: Medicare Other | Source: Ambulatory Visit | Attending: Hematology and Oncology | Admitting: Hematology and Oncology

## 2022-07-01 DIAGNOSIS — Z1231 Encounter for screening mammogram for malignant neoplasm of breast: Secondary | ICD-10-CM

## 2022-07-09 ENCOUNTER — Telehealth: Payer: Self-pay | Admitting: Licensed Clinical Social Worker

## 2022-07-09 NOTE — Telephone Encounter (Signed)
Called patient to discuss upcoming genetic counseling appointment. Patient did not recall that she already had genetic testing done in 2019. We discussed this testing and the option to do RNA testing if she wanted to. Patient does not want further testing at this time since she had a comprehensive panel in 2019. She would like a copy of the report mailed to her.

## 2022-07-15 ENCOUNTER — Inpatient Hospital Stay: Payer: Medicare Other | Admitting: Licensed Clinical Social Worker

## 2022-07-22 DIAGNOSIS — H401211 Low-tension glaucoma, right eye, mild stage: Secondary | ICD-10-CM | POA: Diagnosis not present

## 2022-07-22 DIAGNOSIS — H401222 Low-tension glaucoma, left eye, moderate stage: Secondary | ICD-10-CM | POA: Diagnosis not present

## 2022-08-21 DIAGNOSIS — Z79899 Other long term (current) drug therapy: Secondary | ICD-10-CM | POA: Diagnosis not present

## 2022-08-21 DIAGNOSIS — H401233 Low-tension glaucoma, bilateral, severe stage: Secondary | ICD-10-CM | POA: Diagnosis not present

## 2022-11-05 ENCOUNTER — Other Ambulatory Visit: Payer: Self-pay | Admitting: Family Medicine

## 2022-11-05 DIAGNOSIS — Z1231 Encounter for screening mammogram for malignant neoplasm of breast: Secondary | ICD-10-CM

## 2022-11-25 ENCOUNTER — Ambulatory Visit: Payer: Medicare Other | Admitting: Dermatology

## 2022-12-06 DIAGNOSIS — H401233 Low-tension glaucoma, bilateral, severe stage: Secondary | ICD-10-CM | POA: Diagnosis not present

## 2023-03-05 DIAGNOSIS — E7800 Pure hypercholesterolemia, unspecified: Secondary | ICD-10-CM | POA: Diagnosis not present

## 2023-03-05 DIAGNOSIS — Z79899 Other long term (current) drug therapy: Secondary | ICD-10-CM | POA: Diagnosis not present

## 2023-03-05 DIAGNOSIS — R7303 Prediabetes: Secondary | ICD-10-CM | POA: Diagnosis not present

## 2023-03-11 DIAGNOSIS — Z79899 Other long term (current) drug therapy: Secondary | ICD-10-CM | POA: Diagnosis not present

## 2023-03-11 DIAGNOSIS — Z0001 Encounter for general adult medical examination with abnormal findings: Secondary | ICD-10-CM | POA: Diagnosis not present

## 2023-03-11 DIAGNOSIS — I10 Essential (primary) hypertension: Secondary | ICD-10-CM | POA: Diagnosis not present

## 2023-03-11 DIAGNOSIS — R7303 Prediabetes: Secondary | ICD-10-CM | POA: Diagnosis not present

## 2023-03-11 DIAGNOSIS — E2839 Other primary ovarian failure: Secondary | ICD-10-CM | POA: Diagnosis not present

## 2023-03-11 DIAGNOSIS — Z853 Personal history of malignant neoplasm of breast: Secondary | ICD-10-CM | POA: Diagnosis not present

## 2023-03-11 DIAGNOSIS — E7800 Pure hypercholesterolemia, unspecified: Secondary | ICD-10-CM | POA: Diagnosis not present

## 2023-03-13 ENCOUNTER — Other Ambulatory Visit: Payer: Self-pay | Admitting: Family Medicine

## 2023-03-13 DIAGNOSIS — Z1231 Encounter for screening mammogram for malignant neoplasm of breast: Secondary | ICD-10-CM

## 2023-03-13 DIAGNOSIS — E2839 Other primary ovarian failure: Secondary | ICD-10-CM

## 2023-05-22 DIAGNOSIS — Z23 Encounter for immunization: Secondary | ICD-10-CM | POA: Diagnosis not present

## 2023-06-09 DIAGNOSIS — H401233 Low-tension glaucoma, bilateral, severe stage: Secondary | ICD-10-CM | POA: Diagnosis not present

## 2023-07-03 ENCOUNTER — Ambulatory Visit: Payer: Medicare Other

## 2023-07-11 ENCOUNTER — Ambulatory Visit
Admission: RE | Admit: 2023-07-11 | Discharge: 2023-07-11 | Disposition: A | Discharge status: Home / Self Care | Payer: Medicare Other | Source: Ambulatory Visit | Attending: Family Medicine | Admitting: Family Medicine

## 2023-07-11 DIAGNOSIS — Z1231 Encounter for screening mammogram for malignant neoplasm of breast: Secondary | ICD-10-CM | POA: Diagnosis not present

## 2023-07-21 DIAGNOSIS — L814 Other melanin hyperpigmentation: Secondary | ICD-10-CM | POA: Diagnosis not present

## 2023-07-21 DIAGNOSIS — L821 Other seborrheic keratosis: Secondary | ICD-10-CM | POA: Diagnosis not present

## 2023-07-21 DIAGNOSIS — D225 Melanocytic nevi of trunk: Secondary | ICD-10-CM | POA: Diagnosis not present

## 2023-09-02 ENCOUNTER — Encounter: Payer: Self-pay | Admitting: Podiatry

## 2023-09-02 ENCOUNTER — Ambulatory Visit (INDEPENDENT_AMBULATORY_CARE_PROVIDER_SITE_OTHER): Payer: Medicare Other | Admitting: Podiatry

## 2023-09-02 DIAGNOSIS — Z853 Personal history of malignant neoplasm of breast: Secondary | ICD-10-CM | POA: Insufficient documentation

## 2023-09-02 DIAGNOSIS — M79676 Pain in unspecified toe(s): Secondary | ICD-10-CM | POA: Diagnosis not present

## 2023-09-02 DIAGNOSIS — B351 Tinea unguium: Secondary | ICD-10-CM

## 2023-09-02 NOTE — Progress Notes (Signed)
Subjective:  Patient ID: Cheyenne Lee, female    DOB: 09-09-1941,  MRN: [id number] HPI Chief Complaint  Patient presents with   Nail Problem    Hallux left - toenail thick and discolored x years, concerned because pieces fell off, not sore, all toenails, rash dorsal hallux 1st toe left also   New Patient (Initial Visit)    82 y.o. female presents with the above complaint.   ROS: Denies fever chills nausea vomit muscle aches pains calf pain back pain chest pain shortness of breath.  Past Medical History:  Diagnosis Date   Arthritis    hips   Breast cancer (HCC) 06/25/2017   Right breast   Cancer (HCC)    right breast DCIS   Complication of anesthesia    Family history of breast cancer    Family history of colon cancer    Family history of malignant neoplasm of breast    Glaucoma    H/O seasonal allergies    Hypertension    IBS (irritable bowel syndrome)    Personal history of radiation therapy    PONV (postoperative nausea and vomiting)    Past Surgical History:  Procedure Laterality Date   AXILLARY LYMPH NODE DISSECTION Right 07/29/2017   Procedure: AXILLARY LYMPH NODE DISSECTION;  Surgeon: Manus Rudd, MD;  Location: [facility];  Service: General;  Laterality: Right;   AXILLARY SENTINEL NODE BIOPSY Right 07/29/2017   Procedure: RIGHT AXILLARY SENTINEL NODE BIOPSY;  Surgeon: Manus Rudd, MD;  Location: [facility];  Service: General;  Laterality: Right;   BREAST BIOPSY     Multiple Biopsies, both breasts   BREAST LUMPECTOMY Right 2018   BREAST LUMPECTOMY WITH RADIOACTIVE SEED LOCALIZATION Right 07/15/2017   Procedure: RIGHT BREAST LUMPECTOMY WITH RADIOACTIVE SEED LOCALIZATION ERAS PATHWAY;  Surgeon: Manus Rudd, MD;  Location: [facility];  Service: General;  Laterality: Right;  LMA   CESAREAN SECTION     x4    Current Outpatient Medications:    calcium carbonate (OSCAL) 1500 (600 Ca) MG TABS tablet, Take by mouth  2 (two) times daily with a meal., Disp: , Rfl:    brimonidine (ALPHAGAN) 0.2 % ophthalmic solution, Place 1 drop into both eyes in the morning and at bedtime., Disp: 5 mL, Rfl: 12   Cholecalciferol (VITAMIN D3) 1000000 UNIT/GM LIQD, See admin instructions., Disp: , Rfl:    dicyclomine (BENTYL) 20 MG tablet, 1 tablet, Disp: , Rfl:    fexofenadine (ALLEGRA) 180 MG tablet, Take 180 mg by mouth as needed. , Disp: , Rfl:    ibuprofen (ADVIL,MOTRIN) 200 MG tablet, Take 200 mg by mouth every 6 (six) hours as needed., Disp: , Rfl:    latanoprost (XALATAN) 0.005 % ophthalmic solution, Place 1 drop into both eyes nightly., Disp: , Rfl:    timolol (BETIMOL) 0.25 % ophthalmic solution, Place 1-2 drops into both eyes 2 (two) times daily., Disp: 10 mL, Rfl: 12   triamterene-hydrochlorothiazide (MAXZIDE-25) 37.5-25 MG tablet, Take 1 tablet by mouth daily. 37.5-25, Disp: , Rfl:   Allergies  Allergen Reactions   Codeine     REACTION: anaphylaxis   Latex Rash   Tape Rash   Review of Systems Objective:  There were no vitals filed for this visit.  General: Well developed, nourished, in no acute distress, alert and oriented x3   Dermatological: Skin is warm, dry and supple bilateral.  1 through 5 bilaterally are thick yellow dystrophic clinically mycotic and tender.  Remaining integument  appears unremarkable at this time. There are no open sores, no preulcerative lesions, no rash or signs of infection present.  The only small rash noted is overlying the dorsal aspect of the proximal phalanx appears to be tinea pedis left hallux  Vascular: Dorsalis Pedis artery and Posterior Tibial artery pedal pulses are 2/4 bilateral with immedate capillary fill time. Pedal hair growth present. No varicosities and no lower extremity edema present bilateral.   Neruologic: Grossly intact via light touch bilateral. Vibratory intact via tuning fork bilateral. Protective threshold with Semmes Wienstein monofilament intact to all  pedal sites bilateral. Patellar and Achilles deep tendon reflexes 2+ bilateral. No Babinski or clonus noted bilateral.   Musculoskeletal: No gross boney pedal deformities bilateral. No pain, crepitus, or limitation noted with foot and ankle range of motion bilateral. Muscular strength 5/5 in all groups tested bilateral.  Gait: Unassisted, Nonantalgic.    Radiographs:  None taken  Assessment & Plan:   Assessment: Pain in limb secondary to onychomycosis.  Tinea pedis hallux left  Plan: Discussed topical therapy with Lamisil over-the-counter cream.  Also debrided toenails 1 through 5 bilateral.     Ilya Neely T. [place], [place]

## 2023-09-08 DIAGNOSIS — H401233 Low-tension glaucoma, bilateral, severe stage: Secondary | ICD-10-CM | POA: Diagnosis not present

## 2023-09-30 ENCOUNTER — Other Ambulatory Visit: Payer: Medicare Other

## 2023-10-10 ENCOUNTER — Other Ambulatory Visit: Payer: Self-pay | Admitting: Family Medicine

## 2023-10-10 DIAGNOSIS — Z1231 Encounter for screening mammogram for malignant neoplasm of breast: Secondary | ICD-10-CM

## 2023-10-22 ENCOUNTER — Ambulatory Visit
Admission: RE | Admit: 2023-10-22 | Discharge: 2023-10-22 | Disposition: A | Discharge status: Home / Self Care | Payer: Medicare Other | Source: Ambulatory Visit | Attending: Family Medicine | Admitting: Family Medicine

## 2023-10-22 DIAGNOSIS — N958 Other specified menopausal and perimenopausal disorders: Secondary | ICD-10-CM | POA: Diagnosis not present

## 2023-10-22 DIAGNOSIS — E2839 Other primary ovarian failure: Secondary | ICD-10-CM

## 2023-10-22 DIAGNOSIS — Z1231 Encounter for screening mammogram for malignant neoplasm of breast: Secondary | ICD-10-CM

## 2023-10-22 DIAGNOSIS — M8588 Other specified disorders of bone density and structure, other site: Secondary | ICD-10-CM | POA: Diagnosis not present

## 2023-12-08 DIAGNOSIS — H401233 Low-tension glaucoma, bilateral, severe stage: Secondary | ICD-10-CM | POA: Diagnosis not present

## 2023-12-17 DIAGNOSIS — H401233 Low-tension glaucoma, bilateral, severe stage: Secondary | ICD-10-CM | POA: Diagnosis not present

## 2023-12-23 ENCOUNTER — Ambulatory Visit (INDEPENDENT_AMBULATORY_CARE_PROVIDER_SITE_OTHER): Payer: Medicare Other | Admitting: Podiatry

## 2023-12-23 ENCOUNTER — Encounter: Payer: Self-pay | Admitting: Podiatry

## 2023-12-23 VITALS — Ht 64.0 in | Wt 171.0 lb

## 2023-12-23 DIAGNOSIS — M79676 Pain in unspecified toe(s): Secondary | ICD-10-CM

## 2023-12-23 DIAGNOSIS — B351 Tinea unguium: Secondary | ICD-10-CM | POA: Diagnosis not present

## 2023-12-23 NOTE — Progress Notes (Signed)
  Subjective:  Patient ID: Cheyenne Lee, female    DOB: Feb 24, 1941,  MRN: [id number]  83 y.o. female presents painful elongated mycotic toenails bilaterally which are tender when wearing enclosed shoe gear. Pain is relieved with periodic professional debridement. Chief Complaint  Patient presents with   RFC    She is here for a nail trim, PCP is Dr Peterson Ao and seen every 6 months,    New problem(s): None   PCP is Docia Chuck, Dibas, MD , and last visit was March 11, 2023.  Allergies  Allergen Reactions   Codeine     REACTION: anaphylaxis   Latex Rash   Tape Rash    Review of Systems: Negative except as noted in the HPI.   Objective:  Cheyenne Lee is a pleasant 83 y.o. female WD, WN in NAD. AAO x 3.  Vascular Examination: Vascular status intact b/l with palpable pedal pulses. CFT immediate b/l. Pedal hair present. No edema. No pain with calf compression b/l. Skin temperature gradient WNL b/l. No varicosities noted. No cyanosis or clubbing noted.  Neurological Examination: Sensation grossly intact b/l with 10 gram monofilament. Vibratory sensation intact b/l.  Dermatological Examination: Pedal skin with normal turgor, texture and tone b/l. No open wounds nor interdigital macerations noted. Toenails 2-5 b/l thick, discolored, elongated with subungual debris and pain on dorsal palpation. No hyperkeratotic lesions noted b/l.   Anonychia noted bilateral 2nd toes. Nailbed(s) epithelialized.   Musculoskeletal Examination: Muscle strength 5/5 to b/l LE.  No pain, crepitus noted b/l. No gross pedal deformities. Patient ambulates independently without assistive aids.   Radiographs: None  Last A1c:       No data to display         Assessment:   1. Pain due to onychomycosis of toenail    Plan:  Patient was evaluated and treated. All patient's and/or POA's questions/concerns addressed on today's visit. Mycotic toenails 2-5 debrided in length and girth without incident.  Continue soft, supportive shoe gear daily. Report any pedal injuries to medical professional. Call office if there are any quesitons/concerns. -Patient/POA to call should there be question/concern in the interim.  Return in about 3 months (around 03/23/2024).  Freddie Breech, DPM      [place] LOCATION: [address] [place] [address]                   Office [phone]   [facility] LOCATION: [address] [place], [place] [address] Office [phone]

## 2024-01-23 DIAGNOSIS — L02419 Cutaneous abscess of limb, unspecified: Secondary | ICD-10-CM | POA: Diagnosis not present

## 2024-02-19 DIAGNOSIS — L02412 Cutaneous abscess of left axilla: Secondary | ICD-10-CM | POA: Diagnosis not present

## 2024-03-24 DIAGNOSIS — K582 Mixed irritable bowel syndrome: Secondary | ICD-10-CM | POA: Diagnosis not present

## 2024-03-24 DIAGNOSIS — Z0000 Encounter for general adult medical examination without abnormal findings: Secondary | ICD-10-CM | POA: Diagnosis not present

## 2024-03-24 DIAGNOSIS — R8290 Unspecified abnormal findings in urine: Secondary | ICD-10-CM | POA: Diagnosis not present

## 2024-03-24 DIAGNOSIS — Z853 Personal history of malignant neoplasm of breast: Secondary | ICD-10-CM | POA: Diagnosis not present

## 2024-03-24 DIAGNOSIS — Z79899 Other long term (current) drug therapy: Secondary | ICD-10-CM | POA: Diagnosis not present

## 2024-03-24 DIAGNOSIS — E7800 Pure hypercholesterolemia, unspecified: Secondary | ICD-10-CM | POA: Diagnosis not present

## 2024-03-24 DIAGNOSIS — I10 Essential (primary) hypertension: Secondary | ICD-10-CM | POA: Diagnosis not present

## 2024-03-24 DIAGNOSIS — R7303 Prediabetes: Secondary | ICD-10-CM | POA: Diagnosis not present

## 2024-04-06 ENCOUNTER — Ambulatory Visit (INDEPENDENT_AMBULATORY_CARE_PROVIDER_SITE_OTHER): Admitting: Podiatry

## 2024-04-06 ENCOUNTER — Encounter: Payer: Self-pay | Admitting: Podiatry

## 2024-04-06 DIAGNOSIS — B351 Tinea unguium: Secondary | ICD-10-CM

## 2024-04-06 DIAGNOSIS — M79676 Pain in unspecified toe(s): Secondary | ICD-10-CM | POA: Diagnosis not present

## 2024-04-06 NOTE — Progress Notes (Signed)
  Subjective:  Patient ID: Cheyenne Lee, female    DOB: Jun 11, 1941,  MRN: [id number]  Cheyenne Lee presents to clinic today for: painful thick toenails that are difficult to trim. Pain interferes with ambulation. Aggravating factors include wearing enclosed shoe gear. Pain is relieved with periodic professional debridement.  Chief Complaint  Patient presents with   RFC    RFC non diabetic toenail trim. LOV with PCP 03/24/24.    PCP is Koirala, Dibas, MD.  Allergies  Allergen Reactions   Codeine     REACTION: anaphylaxis   Latex Rash   Tape Rash    Review of Systems: Negative except as noted in the HPI.  Objective: No changes noted in today's physical examination. There were no vitals filed for this visit.  Cheyenne Lee is a pleasant 83 y.o. female WD, WN in NAD. AAO x 3.  Vascular Examination: Capillary refill time <3 seconds b/l LE. Palpable pedal pulses b/l LE. Digital hair present b/l. No pedal edema b/l. Skin temperature gradient WNL b/l. No varicosities b/l. No edema noted b/l LE. No cyanosis or clubbing noted b/l LE.Cheyenne Lee  Dermatological Examination: Pedal skin with normal turgor, texture and tone b/l. No open wounds. No interdigital macerations b/l. Toenails 2-5 b/l thickened, discolored, dystrophic with subungual debris. There is pain on palpation to dorsal aspect of nailplates. Anonychia noted bilateral great toes. Nailbed(s) epithelialized.  No hyperkeratotic nor porokeratotic lesions present on today's visit.Cheyenne Lee  Neurological Examination: Protective sensation intact with 10 gram monofilament b/l LE. Vibratory sensation intact b/l LE.   Musculoskeletal Examination: Muscle strength 5/5 to all lower extremity muscle groups bilaterally. No pain, crepitus or joint limitation noted with ROM bilateral LE. No gross bony deformities bilaterally.  Assessment/Plan: 1. Pain due to onychomycosis of toenail    Consent given for treatment. Patient examined. All patient's  and/or POA's questions/concerns addressed on today's visit.Toenails 2-5 bilaterally  debrided in length and girth without incident.Continue soft, supportive shoe gear daily. Report any pedal injuries to medical professional. Call office if there are any questions/concerns. -Patient/POA to call should there be question/concern in the interim.   Return in about 3 months (around 07/07/2024).  Cheyenne Lee, DPM      [place] LOCATION: [address], [place] [address]                   Office [phone]   [facility] LOCATION: [address] [place], [place] [address] Office [phone]

## 2024-05-19 DIAGNOSIS — H401233 Low-tension glaucoma, bilateral, severe stage: Secondary | ICD-10-CM | POA: Diagnosis not present

## 2024-05-25 DIAGNOSIS — Z23 Encounter for immunization: Secondary | ICD-10-CM | POA: Diagnosis not present

## 2024-06-19 DIAGNOSIS — Z23 Encounter for immunization: Secondary | ICD-10-CM | POA: Diagnosis not present

## 2024-07-12 ENCOUNTER — Ambulatory Visit
Admission: RE | Admit: 2024-07-12 | Discharge: 2024-07-12 | Disposition: A | Discharge status: Home / Self Care | Payer: Medicare Other | Source: Ambulatory Visit | Attending: Family Medicine | Admitting: Family Medicine

## 2024-07-12 DIAGNOSIS — Z1231 Encounter for screening mammogram for malignant neoplasm of breast: Secondary | ICD-10-CM

## 2024-07-20 ENCOUNTER — Encounter: Payer: Self-pay | Admitting: Podiatry

## 2024-07-20 ENCOUNTER — Ambulatory Visit: Admitting: Podiatry

## 2024-07-20 DIAGNOSIS — M79676 Pain in unspecified toe(s): Secondary | ICD-10-CM | POA: Diagnosis not present

## 2024-07-20 DIAGNOSIS — B351 Tinea unguium: Secondary | ICD-10-CM | POA: Diagnosis not present

## 2024-07-25 NOTE — Progress Notes (Signed)
  Subjective:  Patient ID: Cheyenne Lee, female    DOB: [birth year],  MRN: [id number]  Cheyenne Lee presents to clinic today for painful mycotic toenails of both feet that are difficult to trim. Pain interferes with daily activities and wearing enclosed shoe gear comfortably.  Chief Complaint  Patient presents with   Diabetes    [facility] Non diabetic toenail trim. LOV with PCP 03/24/24.   New problem(s): None.   PCP is Koirala, Dibas, MD.  Allergies  Allergen Reactions   Codeine     REACTION: anaphylaxis   Latex Rash   Tape Rash    Review of Systems: Negative except as noted in the HPI.  Objective: No changes noted in today's physical examination. There were no vitals filed for this visit. Cheyenne Lee is a pleasant 83 y.o. female WD, WN in NAD. AAO x 3.  Vascular Examination: Capillary refill time <3 seconds b/l LE. Palpable pedal pulses b/l LE. Digital hair present b/l. No pedal edema b/l. Skin temperature gradient WNL b/l. No varicosities b/l. No edema noted b/l LE. No cyanosis or clubbing noted b/l LE.Cheyenne Lee  Dermatological Examination: Pedal skin with normal turgor, texture and tone b/l. No open wounds. No interdigital macerations b/l. Toenails 2-5 b/l thickened, discolored, dystrophic with subungual debris. There is pain on palpation to dorsal aspect of nailplates. Anonychia noted bilateral great toes. Nailbed(s) epithelialized.  No hyperkeratotic nor porokeratotic lesions present on today's visit.Cheyenne Lee  Neurological Examination: Protective sensation intact with 10 gram monofilament b/l LE. Vibratory sensation intact b/l LE.   Musculoskeletal Examination: Muscle strength 5/5 to all lower extremity muscle groups bilaterally. No pain, crepitus or joint limitation noted with ROM bilateral LE. No gross bony deformities bilaterally.  Assessment/Plan: 1. Pain due to onychomycosis of toenail   Patient was evaluated and treated. All patient's and/or POA's questions/concerns  addressed on today's visit. Mycotic toenails 2-5 b/l debrided in length and girth without incident. Continue soft, supportive shoe gear daily. Report any pedal injuries to medical professional. Call office if there are any quesitons/concerns. -Patient/POA to call should there be question/concern in the interim.   Return in about 3 months (around 10/20/2024).  Cheyenne Lee, DPM      [place] LOCATION: [address] [place] [address]                   Office [phone]   [facility] LOCATION: [address] [place], [place] [address] Office [phone]

## 2024-07-29 ENCOUNTER — Other Ambulatory Visit: Payer: Self-pay | Admitting: Family Medicine

## 2024-07-29 DIAGNOSIS — Z1231 Encounter for screening mammogram for malignant neoplasm of breast: Secondary | ICD-10-CM

## 2024-11-02 ENCOUNTER — Ambulatory Visit: Admitting: Podiatry

## 2025-07-14 ENCOUNTER — Ambulatory Visit
